# Patient Record
Sex: Female | Born: 1935 | Race: White | Hispanic: No | Smoking: Former smoker
Health system: Southern US, Community
[De-identification: ages and names within clinical notes are randomized; demographics above are authoritative.]

## PROBLEM LIST (undated history)

## (undated) DIAGNOSIS — I10 Essential (primary) hypertension: Secondary | ICD-10-CM

## (undated) DIAGNOSIS — Z8701 Personal history of pneumonia (recurrent): Secondary | ICD-10-CM

## (undated) DIAGNOSIS — D649 Anemia, unspecified: Secondary | ICD-10-CM

## (undated) DIAGNOSIS — G319 Degenerative disease of nervous system, unspecified: Secondary | ICD-10-CM

## (undated) DIAGNOSIS — R911 Solitary pulmonary nodule: Secondary | ICD-10-CM

## (undated) DIAGNOSIS — I5040 Unspecified combined systolic (congestive) and diastolic (congestive) heart failure: Secondary | ICD-10-CM

## (undated) DIAGNOSIS — G709 Myoneural disorder, unspecified: Secondary | ICD-10-CM

## (undated) DIAGNOSIS — F419 Anxiety disorder, unspecified: Secondary | ICD-10-CM

## (undated) DIAGNOSIS — M1990 Unspecified osteoarthritis, unspecified site: Secondary | ICD-10-CM

## (undated) DIAGNOSIS — E039 Hypothyroidism, unspecified: Secondary | ICD-10-CM

## (undated) DIAGNOSIS — M48061 Spinal stenosis, lumbar region without neurogenic claudication: Secondary | ICD-10-CM

## (undated) DIAGNOSIS — G20 Parkinson's disease: Secondary | ICD-10-CM

## (undated) DIAGNOSIS — Z9861 Coronary angioplasty status: Secondary | ICD-10-CM

## (undated) DIAGNOSIS — Q132 Other congenital malformations of iris: Secondary | ICD-10-CM

## (undated) DIAGNOSIS — E785 Hyperlipidemia, unspecified: Secondary | ICD-10-CM

## (undated) DIAGNOSIS — K219 Gastro-esophageal reflux disease without esophagitis: Secondary | ICD-10-CM

## (undated) DIAGNOSIS — Z9889 Other specified postprocedural states: Secondary | ICD-10-CM

## (undated) DIAGNOSIS — M2010 Hallux valgus (acquired), unspecified foot: Secondary | ICD-10-CM

## (undated) DIAGNOSIS — F329 Major depressive disorder, single episode, unspecified: Secondary | ICD-10-CM

## (undated) DIAGNOSIS — I2089 Other forms of angina pectoris: Secondary | ICD-10-CM

## (undated) DIAGNOSIS — I208 Other forms of angina pectoris: Secondary | ICD-10-CM

## (undated) DIAGNOSIS — J449 Chronic obstructive pulmonary disease, unspecified: Secondary | ICD-10-CM

## (undated) DIAGNOSIS — I4891 Unspecified atrial fibrillation: Secondary | ICD-10-CM

## (undated) DIAGNOSIS — F32A Depression, unspecified: Secondary | ICD-10-CM

## (undated) DIAGNOSIS — I214 Non-ST elevation (NSTEMI) myocardial infarction: Secondary | ICD-10-CM

## (undated) DIAGNOSIS — I2510 Atherosclerotic heart disease of native coronary artery without angina pectoris: Secondary | ICD-10-CM

## (undated) DIAGNOSIS — I255 Ischemic cardiomyopathy: Secondary | ICD-10-CM

## (undated) DIAGNOSIS — N189 Chronic kidney disease, unspecified: Secondary | ICD-10-CM

## (undated) DIAGNOSIS — I82409 Acute embolism and thrombosis of unspecified deep veins of unspecified lower extremity: Secondary | ICD-10-CM

## (undated) DIAGNOSIS — E0789 Other specified disorders of thyroid: Secondary | ICD-10-CM

## (undated) DIAGNOSIS — G20A1 Parkinson's disease without dyskinesia, without mention of fluctuations: Secondary | ICD-10-CM

## (undated) DIAGNOSIS — I639 Cerebral infarction, unspecified: Secondary | ICD-10-CM

## (undated) HISTORY — PX: ROTATOR CUFF REPAIR: SHX139

## (undated) HISTORY — PX: CHOLECYSTECTOMY: SHX55

## (undated) HISTORY — DX: Atherosclerotic heart disease of native coronary artery without angina pectoris: I25.10

## (undated) HISTORY — DX: Unspecified combined systolic (congestive) and diastolic (congestive) heart failure: I50.40

## (undated) HISTORY — DX: Spinal stenosis, lumbar region without neurogenic claudication: M48.061

## (undated) HISTORY — PX: CERVICAL DISCECTOMY: SHX98

## (undated) HISTORY — DX: Parkinson's disease without dyskinesia, without mention of fluctuations: G20.A1

## (undated) HISTORY — DX: Acute embolism and thrombosis of unspecified deep veins of unspecified lower extremity: I82.409

## (undated) HISTORY — PX: SPLENECTOMY: SUR1306

## (undated) HISTORY — PX: APPENDECTOMY: SHX54

## (undated) HISTORY — DX: Other specified postprocedural states: Z98.890

## (undated) HISTORY — DX: Other forms of angina pectoris: I20.8

## (undated) HISTORY — DX: Other congenital malformations of iris: Q13.2

## (undated) HISTORY — DX: Hallux valgus (acquired), unspecified foot: M20.10

## (undated) HISTORY — DX: Hypothyroidism, unspecified: E03.9

## (undated) HISTORY — DX: Other specified disorders of thyroid: E07.89

## (undated) HISTORY — DX: Personal history of pneumonia (recurrent): Z87.01

## (undated) HISTORY — DX: Parkinson's disease: G20

## (undated) HISTORY — DX: Other forms of angina pectoris: I20.89

## (undated) HISTORY — DX: Solitary pulmonary nodule: R91.1

## (undated) HISTORY — DX: Coronary angioplasty status: Z98.61

## (undated) HISTORY — DX: Degenerative disease of nervous system, unspecified: G31.9

## (undated) HISTORY — DX: Hyperlipidemia, unspecified: E78.5

## (undated) HISTORY — DX: Non-ST elevation (NSTEMI) myocardial infarction: I21.4

## (undated) HISTORY — DX: Ischemic cardiomyopathy: I25.5

---
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
## 1993-07-31 ENCOUNTER — Encounter (INDEPENDENT_AMBULATORY_CARE_PROVIDER_SITE_OTHER): Payer: Self-pay | Admitting: *Deleted

## 1993-07-31 LAB — CONVERTED CEMR LAB

## 1997-10-31 DIAGNOSIS — R911 Solitary pulmonary nodule: Secondary | ICD-10-CM

## 1997-10-31 HISTORY — DX: Solitary pulmonary nodule: R91.1

## 1997-10-31 HISTORY — PX: CARDIAC CATHETERIZATION: SHX172

## 1998-02-03 ENCOUNTER — Encounter: Admission: RE | Admit: 1998-02-03 | Discharge: 1998-02-03 | Payer: Self-pay | Admitting: Sports Medicine

## 1998-02-22 ENCOUNTER — Emergency Department (HOSPITAL_COMMUNITY): Admission: EM | Admit: 1998-02-22 | Discharge: 1998-02-22 | Payer: Self-pay | Admitting: Emergency Medicine

## 1998-02-24 ENCOUNTER — Encounter: Admission: RE | Admit: 1998-02-24 | Discharge: 1998-02-24 | Payer: Self-pay | Admitting: Family Medicine

## 1998-02-26 ENCOUNTER — Inpatient Hospital Stay (HOSPITAL_COMMUNITY): Admission: EM | Admit: 1998-02-26 | Discharge: 1998-03-04 | Payer: Self-pay | Admitting: Emergency Medicine

## 1998-03-03 ENCOUNTER — Encounter: Admission: RE | Admit: 1998-03-03 | Discharge: 1998-03-03 | Payer: Self-pay | Admitting: Family Medicine

## 1998-03-06 ENCOUNTER — Other Ambulatory Visit: Admission: RE | Admit: 1998-03-06 | Discharge: 1998-03-06 | Payer: Self-pay | Admitting: Family Medicine

## 1998-03-09 ENCOUNTER — Other Ambulatory Visit: Admission: RE | Admit: 1998-03-09 | Discharge: 1998-03-09 | Payer: Self-pay | Admitting: Family Medicine

## 1998-03-10 ENCOUNTER — Encounter: Admission: RE | Admit: 1998-03-10 | Discharge: 1998-03-10 | Payer: Self-pay | Admitting: Family Medicine

## 1998-03-11 ENCOUNTER — Other Ambulatory Visit: Admission: RE | Admit: 1998-03-11 | Discharge: 1998-03-11 | Payer: Self-pay | Admitting: Family Medicine

## 1998-03-13 ENCOUNTER — Other Ambulatory Visit: Admission: RE | Admit: 1998-03-13 | Discharge: 1998-03-13 | Payer: Self-pay | Admitting: Cardiology

## 1998-03-16 ENCOUNTER — Other Ambulatory Visit: Admission: RE | Admit: 1998-03-16 | Discharge: 1998-03-16 | Payer: Self-pay | Admitting: Family Medicine

## 1998-03-18 ENCOUNTER — Other Ambulatory Visit: Admission: RE | Admit: 1998-03-18 | Discharge: 1998-03-18 | Payer: Self-pay | Admitting: Family Medicine

## 1998-03-20 ENCOUNTER — Other Ambulatory Visit: Admission: RE | Admit: 1998-03-20 | Discharge: 1998-03-20 | Payer: Self-pay | Admitting: Family Medicine

## 1998-03-20 ENCOUNTER — Encounter: Admission: RE | Admit: 1998-03-20 | Discharge: 1998-03-20 | Payer: Self-pay | Admitting: Family Medicine

## 1998-03-24 ENCOUNTER — Other Ambulatory Visit: Admission: RE | Admit: 1998-03-24 | Discharge: 1998-03-24 | Payer: Self-pay | Admitting: Family Medicine

## 1998-03-25 ENCOUNTER — Other Ambulatory Visit: Admission: RE | Admit: 1998-03-25 | Discharge: 1998-03-25 | Payer: Self-pay | Admitting: Oncology

## 1998-03-31 ENCOUNTER — Other Ambulatory Visit: Admission: RE | Admit: 1998-03-31 | Discharge: 1998-03-31 | Payer: Self-pay | Admitting: Family Medicine

## 1998-04-07 ENCOUNTER — Other Ambulatory Visit: Admission: RE | Admit: 1998-04-07 | Discharge: 1998-04-07 | Payer: Self-pay | Admitting: Family Medicine

## 1998-04-15 ENCOUNTER — Encounter: Admission: RE | Admit: 1998-04-15 | Discharge: 1998-04-15 | Payer: Self-pay | Admitting: Family Medicine

## 1998-05-06 ENCOUNTER — Encounter: Admission: RE | Admit: 1998-05-06 | Discharge: 1998-05-06 | Payer: Self-pay | Admitting: Family Medicine

## 1998-05-11 ENCOUNTER — Ambulatory Visit (HOSPITAL_COMMUNITY): Admission: RE | Admit: 1998-05-11 | Discharge: 1998-05-11 | Payer: Self-pay | Admitting: Cardiology

## 1998-05-15 ENCOUNTER — Ambulatory Visit (HOSPITAL_COMMUNITY): Admission: RE | Admit: 1998-05-15 | Discharge: 1998-05-15 | Payer: Self-pay | Admitting: Cardiology

## 1998-05-18 ENCOUNTER — Inpatient Hospital Stay (HOSPITAL_COMMUNITY): Admission: EM | Admit: 1998-05-18 | Discharge: 1998-05-20 | Payer: Self-pay | Admitting: Cardiology

## 1998-05-19 ENCOUNTER — Encounter: Admission: RE | Admit: 1998-05-19 | Discharge: 1998-05-19 | Payer: Self-pay | Admitting: Family Medicine

## 1998-06-03 ENCOUNTER — Encounter: Admission: RE | Admit: 1998-06-03 | Discharge: 1998-06-03 | Payer: Self-pay | Admitting: Family Medicine

## 1998-06-11 ENCOUNTER — Encounter: Admission: RE | Admit: 1998-06-11 | Discharge: 1998-06-11 | Payer: Self-pay | Admitting: Family Medicine

## 1998-06-24 ENCOUNTER — Encounter: Admission: RE | Admit: 1998-06-24 | Discharge: 1998-06-24 | Payer: Self-pay | Admitting: Family Medicine

## 1998-07-21 ENCOUNTER — Encounter: Admission: RE | Admit: 1998-07-21 | Discharge: 1998-07-21 | Payer: Self-pay | Admitting: Family Medicine

## 1998-08-13 ENCOUNTER — Other Ambulatory Visit: Admission: RE | Admit: 1998-08-13 | Discharge: 1998-08-13 | Payer: Self-pay | Admitting: Gynecology

## 1998-09-22 ENCOUNTER — Encounter: Admission: RE | Admit: 1998-09-22 | Discharge: 1998-09-22 | Payer: Self-pay | Admitting: Sports Medicine

## 1998-10-20 ENCOUNTER — Encounter: Payer: Self-pay | Admitting: Family Medicine

## 1998-10-20 ENCOUNTER — Encounter: Admission: RE | Admit: 1998-10-20 | Discharge: 1998-10-20 | Payer: Self-pay | Admitting: Sports Medicine

## 1998-10-20 ENCOUNTER — Inpatient Hospital Stay (HOSPITAL_COMMUNITY): Admission: AD | Admit: 1998-10-20 | Discharge: 1998-10-22 | Payer: Self-pay | Admitting: Family Medicine

## 1998-10-31 HISTORY — PX: CARDIAC CATHETERIZATION: SHX172

## 1998-11-03 ENCOUNTER — Encounter: Admission: RE | Admit: 1998-11-03 | Discharge: 1998-11-03 | Payer: Self-pay | Admitting: Family Medicine

## 1998-11-05 ENCOUNTER — Inpatient Hospital Stay (HOSPITAL_COMMUNITY): Admission: AD | Admit: 1998-11-05 | Discharge: 1998-11-06 | Payer: Self-pay | Admitting: Cardiology

## 1998-12-01 ENCOUNTER — Encounter: Admission: RE | Admit: 1998-12-01 | Discharge: 1998-12-01 | Payer: Self-pay | Admitting: Family Medicine

## 1998-12-30 ENCOUNTER — Encounter: Admission: RE | Admit: 1998-12-30 | Discharge: 1998-12-30 | Payer: Self-pay | Admitting: Family Medicine

## 1999-01-12 ENCOUNTER — Ambulatory Visit (HOSPITAL_COMMUNITY): Admission: RE | Admit: 1999-01-12 | Discharge: 1999-01-12 | Payer: Self-pay | Admitting: Gastroenterology

## 1999-01-17 ENCOUNTER — Inpatient Hospital Stay (HOSPITAL_COMMUNITY): Admission: EM | Admit: 1999-01-17 | Discharge: 1999-01-19 | Payer: Self-pay | Admitting: Emergency Medicine

## 1999-01-17 ENCOUNTER — Encounter: Payer: Self-pay | Admitting: Emergency Medicine

## 1999-01-19 ENCOUNTER — Encounter: Payer: Self-pay | Admitting: Emergency Medicine

## 1999-01-19 ENCOUNTER — Encounter: Admission: RE | Admit: 1999-01-19 | Discharge: 1999-01-19 | Payer: Self-pay | Admitting: Sports Medicine

## 1999-01-26 ENCOUNTER — Encounter: Admission: RE | Admit: 1999-01-26 | Discharge: 1999-01-26 | Payer: Self-pay | Admitting: Family Medicine

## 1999-02-04 ENCOUNTER — Ambulatory Visit (HOSPITAL_COMMUNITY): Admission: RE | Admit: 1999-02-04 | Discharge: 1999-02-04 | Payer: Self-pay | Admitting: Gynecology

## 1999-02-09 ENCOUNTER — Encounter: Admission: RE | Admit: 1999-02-09 | Discharge: 1999-02-09 | Payer: Self-pay | Admitting: Family Medicine

## 1999-03-09 ENCOUNTER — Encounter: Admission: RE | Admit: 1999-03-09 | Discharge: 1999-03-09 | Payer: Self-pay | Admitting: Family Medicine

## 1999-03-24 ENCOUNTER — Encounter: Admission: RE | Admit: 1999-03-24 | Discharge: 1999-03-24 | Payer: Self-pay | Admitting: Family Medicine

## 1999-03-29 ENCOUNTER — Inpatient Hospital Stay (HOSPITAL_COMMUNITY): Admission: EM | Admit: 1999-03-29 | Discharge: 1999-03-31 | Payer: Self-pay | Admitting: Emergency Medicine

## 1999-03-29 ENCOUNTER — Encounter: Payer: Self-pay | Admitting: Emergency Medicine

## 1999-04-06 ENCOUNTER — Encounter: Admission: RE | Admit: 1999-04-06 | Discharge: 1999-04-06 | Payer: Self-pay | Admitting: Family Medicine

## 1999-04-26 ENCOUNTER — Encounter: Payer: Self-pay | Admitting: Cardiology

## 1999-04-26 ENCOUNTER — Ambulatory Visit (HOSPITAL_COMMUNITY): Admission: RE | Admit: 1999-04-26 | Discharge: 1999-04-26 | Payer: Self-pay | Admitting: Cardiology

## 1999-05-03 ENCOUNTER — Encounter: Admission: RE | Admit: 1999-05-03 | Discharge: 1999-05-03 | Payer: Self-pay | Admitting: Family Medicine

## 1999-05-05 ENCOUNTER — Ambulatory Visit (HOSPITAL_COMMUNITY): Admission: RE | Admit: 1999-05-05 | Discharge: 1999-05-05 | Payer: Self-pay | Admitting: Family Medicine

## 1999-05-11 ENCOUNTER — Encounter: Admission: RE | Admit: 1999-05-11 | Discharge: 1999-05-11 | Payer: Self-pay | Admitting: Sports Medicine

## 1999-05-14 ENCOUNTER — Encounter: Admission: RE | Admit: 1999-05-14 | Discharge: 1999-05-14 | Payer: Self-pay | Admitting: Family Medicine

## 1999-05-21 ENCOUNTER — Encounter: Admission: RE | Admit: 1999-05-21 | Discharge: 1999-05-21 | Payer: Self-pay | Admitting: Family Medicine

## 1999-05-25 ENCOUNTER — Encounter: Admission: RE | Admit: 1999-05-25 | Discharge: 1999-05-25 | Payer: Self-pay | Admitting: Family Medicine

## 1999-06-08 ENCOUNTER — Encounter: Admission: RE | Admit: 1999-06-08 | Discharge: 1999-06-08 | Payer: Self-pay | Admitting: Family Medicine

## 1999-06-14 ENCOUNTER — Ambulatory Visit (HOSPITAL_COMMUNITY): Admission: RE | Admit: 1999-06-14 | Discharge: 1999-06-14 | Payer: Self-pay | Admitting: Cardiology

## 1999-07-06 ENCOUNTER — Encounter: Admission: RE | Admit: 1999-07-06 | Discharge: 1999-07-06 | Payer: Self-pay | Admitting: Sports Medicine

## 1999-07-29 ENCOUNTER — Ambulatory Visit (HOSPITAL_COMMUNITY): Admission: RE | Admit: 1999-07-29 | Discharge: 1999-07-30 | Payer: Self-pay | Admitting: Orthopedic Surgery

## 1999-08-03 ENCOUNTER — Encounter: Admission: RE | Admit: 1999-08-03 | Discharge: 1999-08-03 | Payer: Self-pay | Admitting: Family Medicine

## 1999-08-05 ENCOUNTER — Encounter: Admission: RE | Admit: 1999-08-05 | Discharge: 1999-11-03 | Payer: Self-pay | Admitting: Orthopedic Surgery

## 1999-08-17 ENCOUNTER — Encounter: Admission: RE | Admit: 1999-08-17 | Discharge: 1999-08-17 | Payer: Self-pay | Admitting: Family Medicine

## 1999-09-07 ENCOUNTER — Encounter: Admission: RE | Admit: 1999-09-07 | Discharge: 1999-09-07 | Payer: Self-pay | Admitting: Family Medicine

## 1999-09-08 ENCOUNTER — Encounter: Admission: RE | Admit: 1999-09-08 | Discharge: 1999-09-08 | Payer: Self-pay | Admitting: Family Medicine

## 1999-09-21 ENCOUNTER — Encounter: Admission: RE | Admit: 1999-09-21 | Discharge: 1999-09-21 | Payer: Self-pay | Admitting: Family Medicine

## 1999-10-05 ENCOUNTER — Encounter: Admission: RE | Admit: 1999-10-05 | Discharge: 1999-10-05 | Payer: Self-pay | Admitting: Family Medicine

## 1999-10-09 ENCOUNTER — Inpatient Hospital Stay (HOSPITAL_COMMUNITY): Admission: EM | Admit: 1999-10-09 | Discharge: 1999-10-12 | Payer: Self-pay | Admitting: Emergency Medicine

## 1999-10-09 ENCOUNTER — Encounter: Payer: Self-pay | Admitting: Family Medicine

## 1999-10-13 ENCOUNTER — Encounter: Admission: RE | Admit: 1999-10-13 | Discharge: 1999-10-13 | Payer: Self-pay | Admitting: Family Medicine

## 1999-10-15 ENCOUNTER — Emergency Department (HOSPITAL_COMMUNITY): Admission: EM | Admit: 1999-10-15 | Discharge: 1999-10-15 | Payer: Self-pay | Admitting: *Deleted

## 1999-10-15 ENCOUNTER — Encounter: Payer: Self-pay | Admitting: Emergency Medicine

## 1999-10-19 ENCOUNTER — Encounter: Admission: RE | Admit: 1999-10-19 | Discharge: 1999-10-19 | Payer: Self-pay | Admitting: Family Medicine

## 1999-10-29 ENCOUNTER — Encounter: Admission: RE | Admit: 1999-10-29 | Discharge: 1999-10-29 | Payer: Self-pay | Admitting: Family Medicine

## 1999-11-01 DIAGNOSIS — E0789 Other specified disorders of thyroid: Secondary | ICD-10-CM

## 1999-11-01 DIAGNOSIS — M48061 Spinal stenosis, lumbar region without neurogenic claudication: Secondary | ICD-10-CM

## 1999-11-01 HISTORY — DX: Spinal stenosis, lumbar region without neurogenic claudication: M48.061

## 1999-11-01 HISTORY — PX: ORIF HIP FRACTURE: SHX2125

## 1999-11-01 HISTORY — DX: Other specified disorders of thyroid: E07.89

## 1999-11-02 ENCOUNTER — Encounter: Payer: Self-pay | Admitting: Family Medicine

## 1999-11-02 ENCOUNTER — Encounter: Admission: RE | Admit: 1999-11-02 | Discharge: 1999-11-02 | Payer: Self-pay | Admitting: Family Medicine

## 1999-11-02 ENCOUNTER — Inpatient Hospital Stay (HOSPITAL_COMMUNITY): Admission: RE | Admit: 1999-11-02 | Discharge: 1999-11-05 | Payer: Self-pay | Admitting: Family Medicine

## 1999-11-10 ENCOUNTER — Encounter: Admission: RE | Admit: 1999-11-10 | Discharge: 1999-11-10 | Payer: Self-pay | Admitting: Family Medicine

## 1999-11-23 ENCOUNTER — Encounter: Payer: Self-pay | Admitting: Family Medicine

## 1999-11-23 ENCOUNTER — Ambulatory Visit (HOSPITAL_COMMUNITY): Admission: RE | Admit: 1999-11-23 | Discharge: 1999-11-23 | Payer: Self-pay | Admitting: Family Medicine

## 1999-11-24 ENCOUNTER — Encounter: Admission: RE | Admit: 1999-11-24 | Discharge: 1999-11-24 | Payer: Self-pay | Admitting: Family Medicine

## 1999-12-08 ENCOUNTER — Encounter: Admission: RE | Admit: 1999-12-08 | Discharge: 1999-12-08 | Payer: Self-pay | Admitting: Family Medicine

## 1999-12-14 ENCOUNTER — Encounter: Admission: RE | Admit: 1999-12-14 | Discharge: 1999-12-14 | Payer: Self-pay | Admitting: Family Medicine

## 1999-12-28 ENCOUNTER — Encounter: Admission: RE | Admit: 1999-12-28 | Discharge: 1999-12-28 | Payer: Self-pay | Admitting: Family Medicine

## 2000-01-03 ENCOUNTER — Encounter: Payer: Self-pay | Admitting: Otolaryngology

## 2000-01-03 ENCOUNTER — Ambulatory Visit (HOSPITAL_COMMUNITY): Admission: RE | Admit: 2000-01-03 | Discharge: 2000-01-03 | Payer: Self-pay | Admitting: Otolaryngology

## 2000-01-18 ENCOUNTER — Encounter: Admission: RE | Admit: 2000-01-18 | Discharge: 2000-01-18 | Payer: Self-pay | Admitting: Family Medicine

## 2000-02-08 ENCOUNTER — Encounter: Admission: RE | Admit: 2000-02-08 | Discharge: 2000-02-08 | Payer: Self-pay | Admitting: Family Medicine

## 2000-02-22 ENCOUNTER — Encounter: Admission: RE | Admit: 2000-02-22 | Discharge: 2000-02-22 | Payer: Self-pay | Admitting: Family Medicine

## 2000-02-28 ENCOUNTER — Encounter: Admission: RE | Admit: 2000-02-28 | Discharge: 2000-02-28 | Payer: Self-pay | Admitting: Family Medicine

## 2000-02-28 ENCOUNTER — Encounter: Admission: RE | Admit: 2000-02-28 | Discharge: 2000-02-28 | Payer: Self-pay | Admitting: Sports Medicine

## 2000-02-28 ENCOUNTER — Encounter: Payer: Self-pay | Admitting: Sports Medicine

## 2000-03-07 ENCOUNTER — Encounter: Payer: Self-pay | Admitting: Emergency Medicine

## 2000-03-07 ENCOUNTER — Inpatient Hospital Stay (HOSPITAL_COMMUNITY): Admission: EM | Admit: 2000-03-07 | Discharge: 2000-03-15 | Payer: Self-pay | Admitting: Emergency Medicine

## 2000-03-08 ENCOUNTER — Encounter: Payer: Self-pay | Admitting: Orthopedic Surgery

## 2000-03-10 ENCOUNTER — Encounter: Payer: Self-pay | Admitting: Orthopedic Surgery

## 2000-03-14 ENCOUNTER — Encounter: Payer: Self-pay | Admitting: Orthopedic Surgery

## 2000-03-15 ENCOUNTER — Encounter: Admission: RE | Admit: 2000-03-15 | Discharge: 2000-06-13 | Payer: Self-pay | Admitting: Family Medicine

## 2000-04-01 ENCOUNTER — Encounter: Payer: Self-pay | Admitting: Emergency Medicine

## 2000-04-01 ENCOUNTER — Inpatient Hospital Stay (HOSPITAL_COMMUNITY): Admission: EM | Admit: 2000-04-01 | Discharge: 2000-04-03 | Payer: Self-pay | Admitting: Emergency Medicine

## 2000-05-23 ENCOUNTER — Ambulatory Visit (HOSPITAL_COMMUNITY): Admission: RE | Admit: 2000-05-23 | Discharge: 2000-05-23 | Payer: Self-pay | Admitting: Family Medicine

## 2000-06-13 ENCOUNTER — Encounter: Admission: RE | Admit: 2000-06-13 | Discharge: 2000-06-13 | Payer: Self-pay | Admitting: Family Medicine

## 2000-07-18 ENCOUNTER — Encounter: Admission: RE | Admit: 2000-07-18 | Discharge: 2000-07-18 | Payer: Self-pay | Admitting: Family Medicine

## 2000-07-19 ENCOUNTER — Other Ambulatory Visit: Admission: RE | Admit: 2000-07-19 | Discharge: 2000-07-19 | Payer: Self-pay | Admitting: Gynecology

## 2000-08-07 ENCOUNTER — Encounter: Admission: RE | Admit: 2000-08-07 | Discharge: 2000-08-07 | Payer: Self-pay | Admitting: Family Medicine

## 2000-08-07 ENCOUNTER — Encounter: Payer: Self-pay | Admitting: Family Medicine

## 2000-08-28 ENCOUNTER — Encounter: Admission: RE | Admit: 2000-08-28 | Discharge: 2000-08-28 | Payer: Self-pay | Admitting: Family Medicine

## 2000-09-13 ENCOUNTER — Encounter: Admission: RE | Admit: 2000-09-13 | Discharge: 2000-09-13 | Payer: Self-pay | Admitting: Family Medicine

## 2000-10-11 ENCOUNTER — Encounter: Admission: RE | Admit: 2000-10-11 | Discharge: 2000-10-11 | Payer: Self-pay | Admitting: Family Medicine

## 2000-11-08 ENCOUNTER — Encounter: Admission: RE | Admit: 2000-11-08 | Discharge: 2000-11-08 | Payer: Self-pay | Admitting: Family Medicine

## 2000-12-06 ENCOUNTER — Encounter: Admission: RE | Admit: 2000-12-06 | Discharge: 2000-12-06 | Payer: Self-pay | Admitting: Family Medicine

## 2000-12-29 HISTORY — PX: CARDIAC CATHETERIZATION: SHX172

## 2001-01-01 ENCOUNTER — Encounter: Admission: RE | Admit: 2001-01-01 | Discharge: 2001-01-01 | Payer: Self-pay | Admitting: Family Medicine

## 2001-01-09 ENCOUNTER — Encounter: Payer: Self-pay | Admitting: Emergency Medicine

## 2001-01-09 ENCOUNTER — Inpatient Hospital Stay (HOSPITAL_COMMUNITY): Admission: EM | Admit: 2001-01-09 | Discharge: 2001-01-12 | Payer: Self-pay | Admitting: Emergency Medicine

## 2001-01-10 ENCOUNTER — Encounter: Payer: Self-pay | Admitting: Family Medicine

## 2001-01-17 ENCOUNTER — Encounter: Admission: RE | Admit: 2001-01-17 | Discharge: 2001-01-17 | Payer: Self-pay | Admitting: Family Medicine

## 2001-02-13 ENCOUNTER — Encounter: Admission: RE | Admit: 2001-02-13 | Discharge: 2001-02-13 | Payer: Self-pay | Admitting: Family Medicine

## 2001-02-14 ENCOUNTER — Encounter: Admission: RE | Admit: 2001-02-14 | Discharge: 2001-02-14 | Payer: Self-pay | Admitting: Family Medicine

## 2001-03-13 ENCOUNTER — Encounter: Admission: RE | Admit: 2001-03-13 | Discharge: 2001-03-13 | Payer: Self-pay | Admitting: Family Medicine

## 2001-03-27 ENCOUNTER — Encounter: Admission: RE | Admit: 2001-03-27 | Discharge: 2001-03-27 | Payer: Self-pay | Admitting: Family Medicine

## 2001-04-24 ENCOUNTER — Encounter: Admission: RE | Admit: 2001-04-24 | Discharge: 2001-04-24 | Payer: Self-pay | Admitting: Family Medicine

## 2001-05-22 ENCOUNTER — Encounter: Admission: RE | Admit: 2001-05-22 | Discharge: 2001-05-22 | Payer: Self-pay | Admitting: Family Medicine

## 2001-05-30 ENCOUNTER — Encounter: Admission: RE | Admit: 2001-05-30 | Discharge: 2001-05-30 | Payer: Self-pay | Admitting: Family Medicine

## 2001-05-31 ENCOUNTER — Encounter: Admission: RE | Admit: 2001-05-31 | Discharge: 2001-05-31 | Payer: Self-pay | Admitting: Family Medicine

## 2001-06-06 ENCOUNTER — Encounter: Admission: RE | Admit: 2001-06-06 | Discharge: 2001-06-06 | Payer: Self-pay | Admitting: Family Medicine

## 2001-06-19 ENCOUNTER — Encounter: Admission: RE | Admit: 2001-06-19 | Discharge: 2001-06-19 | Payer: Self-pay | Admitting: Family Medicine

## 2001-06-21 ENCOUNTER — Encounter: Payer: Self-pay | Admitting: Family Medicine

## 2001-06-21 ENCOUNTER — Encounter: Admission: RE | Admit: 2001-06-21 | Discharge: 2001-06-21 | Payer: Self-pay | Admitting: Family Medicine

## 2001-07-17 ENCOUNTER — Encounter: Admission: RE | Admit: 2001-07-17 | Discharge: 2001-07-17 | Payer: Self-pay | Admitting: Family Medicine

## 2001-07-31 HISTORY — PX: CARDIAC CATHETERIZATION: SHX172

## 2001-08-14 ENCOUNTER — Encounter: Admission: RE | Admit: 2001-08-14 | Discharge: 2001-08-14 | Payer: Self-pay | Admitting: Family Medicine

## 2001-08-23 ENCOUNTER — Inpatient Hospital Stay (HOSPITAL_COMMUNITY): Admission: EM | Admit: 2001-08-23 | Discharge: 2001-08-25 | Payer: Self-pay

## 2001-08-23 ENCOUNTER — Encounter: Payer: Self-pay | Admitting: Cardiology

## 2001-10-02 ENCOUNTER — Encounter: Admission: RE | Admit: 2001-10-02 | Discharge: 2001-10-02 | Payer: Self-pay | Admitting: Family Medicine

## 2001-10-18 ENCOUNTER — Encounter: Admission: RE | Admit: 2001-10-18 | Discharge: 2001-10-18 | Payer: Self-pay | Admitting: Family Medicine

## 2001-10-18 ENCOUNTER — Ambulatory Visit (HOSPITAL_COMMUNITY): Admission: RE | Admit: 2001-10-18 | Discharge: 2001-10-18 | Payer: Self-pay | Admitting: Family Medicine

## 2001-10-18 ENCOUNTER — Encounter: Payer: Self-pay | Admitting: *Deleted

## 2001-10-18 ENCOUNTER — Encounter: Admission: RE | Admit: 2001-10-18 | Discharge: 2001-10-18 | Payer: Self-pay | Admitting: *Deleted

## 2001-10-19 ENCOUNTER — Encounter: Admission: RE | Admit: 2001-10-19 | Discharge: 2001-10-19 | Payer: Self-pay | Admitting: Family Medicine

## 2001-11-06 ENCOUNTER — Encounter: Admission: RE | Admit: 2001-11-06 | Discharge: 2001-11-06 | Payer: Self-pay | Admitting: Family Medicine

## 2001-12-04 ENCOUNTER — Encounter (INDEPENDENT_AMBULATORY_CARE_PROVIDER_SITE_OTHER): Payer: Self-pay | Admitting: Specialist

## 2001-12-04 ENCOUNTER — Ambulatory Visit (HOSPITAL_COMMUNITY): Admission: RE | Admit: 2001-12-04 | Discharge: 2001-12-04 | Payer: Self-pay | Admitting: Gastroenterology

## 2001-12-12 ENCOUNTER — Encounter: Admission: RE | Admit: 2001-12-12 | Discharge: 2001-12-12 | Payer: Self-pay | Admitting: Family Medicine

## 2001-12-17 ENCOUNTER — Emergency Department (HOSPITAL_COMMUNITY): Admission: EM | Admit: 2001-12-17 | Discharge: 2001-12-17 | Payer: Self-pay

## 2001-12-17 ENCOUNTER — Encounter: Payer: Self-pay | Admitting: Emergency Medicine

## 2002-01-05 ENCOUNTER — Emergency Department (HOSPITAL_COMMUNITY): Admission: EM | Admit: 2002-01-05 | Discharge: 2002-01-05 | Payer: Self-pay | Admitting: Emergency Medicine

## 2002-01-05 ENCOUNTER — Encounter: Payer: Self-pay | Admitting: Emergency Medicine

## 2002-01-08 ENCOUNTER — Encounter: Admission: RE | Admit: 2002-01-08 | Discharge: 2002-01-08 | Payer: Self-pay | Admitting: Family Medicine

## 2002-02-05 ENCOUNTER — Encounter: Admission: RE | Admit: 2002-02-05 | Discharge: 2002-02-05 | Payer: Self-pay | Admitting: Family Medicine

## 2002-03-05 ENCOUNTER — Encounter: Admission: RE | Admit: 2002-03-05 | Discharge: 2002-03-05 | Payer: Self-pay | Admitting: Family Medicine

## 2002-03-12 ENCOUNTER — Encounter: Payer: Self-pay | Admitting: Family Medicine

## 2002-03-12 ENCOUNTER — Encounter: Admission: RE | Admit: 2002-03-12 | Discharge: 2002-03-12 | Payer: Self-pay | Admitting: Family Medicine

## 2002-04-02 ENCOUNTER — Encounter: Admission: RE | Admit: 2002-04-02 | Discharge: 2002-04-02 | Payer: Self-pay | Admitting: Family Medicine

## 2002-05-01 ENCOUNTER — Encounter: Admission: RE | Admit: 2002-05-01 | Discharge: 2002-05-01 | Payer: Self-pay | Admitting: Family Medicine

## 2002-05-21 ENCOUNTER — Encounter: Admission: RE | Admit: 2002-05-21 | Discharge: 2002-05-21 | Payer: Self-pay | Admitting: Family Medicine

## 2002-06-14 ENCOUNTER — Encounter: Admission: RE | Admit: 2002-06-14 | Discharge: 2002-06-14 | Payer: Self-pay | Admitting: Family Medicine

## 2002-06-17 ENCOUNTER — Encounter: Admission: RE | Admit: 2002-06-17 | Discharge: 2002-06-17 | Payer: Self-pay | Admitting: Family Medicine

## 2002-06-25 ENCOUNTER — Encounter: Admission: RE | Admit: 2002-06-25 | Discharge: 2002-06-25 | Payer: Self-pay | Admitting: Family Medicine

## 2002-07-01 DIAGNOSIS — G319 Degenerative disease of nervous system, unspecified: Secondary | ICD-10-CM

## 2002-07-01 HISTORY — DX: Degenerative disease of nervous system, unspecified: G31.9

## 2002-07-09 ENCOUNTER — Encounter: Admission: RE | Admit: 2002-07-09 | Discharge: 2002-07-09 | Payer: Self-pay | Admitting: Family Medicine

## 2002-07-12 ENCOUNTER — Encounter: Admission: RE | Admit: 2002-07-12 | Discharge: 2002-07-12 | Payer: Self-pay | Admitting: Family Medicine

## 2002-07-12 ENCOUNTER — Encounter: Payer: Self-pay | Admitting: Family Medicine

## 2002-07-23 ENCOUNTER — Encounter: Admission: RE | Admit: 2002-07-23 | Discharge: 2002-07-23 | Payer: Self-pay | Admitting: Family Medicine

## 2002-08-06 ENCOUNTER — Encounter: Admission: RE | Admit: 2002-08-06 | Discharge: 2002-08-06 | Payer: Self-pay | Admitting: Orthopedic Surgery

## 2002-08-06 ENCOUNTER — Encounter: Payer: Self-pay | Admitting: Orthopedic Surgery

## 2002-08-20 ENCOUNTER — Encounter: Admission: RE | Admit: 2002-08-20 | Discharge: 2002-08-20 | Payer: Self-pay | Admitting: Family Medicine

## 2002-09-17 ENCOUNTER — Encounter: Admission: RE | Admit: 2002-09-17 | Discharge: 2002-09-17 | Payer: Self-pay | Admitting: Family Medicine

## 2002-10-15 ENCOUNTER — Encounter: Payer: Self-pay | Admitting: Emergency Medicine

## 2002-10-15 ENCOUNTER — Encounter: Admission: RE | Admit: 2002-10-15 | Discharge: 2002-10-15 | Payer: Self-pay | Admitting: Family Medicine

## 2002-10-16 ENCOUNTER — Inpatient Hospital Stay (HOSPITAL_COMMUNITY): Admission: EM | Admit: 2002-10-16 | Discharge: 2002-10-18 | Payer: Self-pay | Admitting: Emergency Medicine

## 2002-10-16 ENCOUNTER — Encounter: Payer: Self-pay | Admitting: Cardiology

## 2002-11-06 ENCOUNTER — Encounter: Admission: RE | Admit: 2002-11-06 | Discharge: 2002-11-06 | Payer: Self-pay | Admitting: Family Medicine

## 2002-11-07 ENCOUNTER — Encounter: Payer: Self-pay | Admitting: Family Medicine

## 2002-11-07 ENCOUNTER — Encounter: Admission: RE | Admit: 2002-11-07 | Discharge: 2002-11-07 | Payer: Self-pay | Admitting: Family Medicine

## 2002-12-01 DIAGNOSIS — I255 Ischemic cardiomyopathy: Secondary | ICD-10-CM

## 2002-12-01 HISTORY — DX: Ischemic cardiomyopathy: I25.5

## 2002-12-02 ENCOUNTER — Encounter: Payer: Self-pay | Admitting: Emergency Medicine

## 2002-12-02 ENCOUNTER — Emergency Department (HOSPITAL_COMMUNITY): Admission: EM | Admit: 2002-12-02 | Discharge: 2002-12-02 | Payer: Self-pay | Admitting: Emergency Medicine

## 2002-12-03 ENCOUNTER — Encounter: Admission: RE | Admit: 2002-12-03 | Discharge: 2002-12-03 | Payer: Self-pay | Admitting: Family Medicine

## 2002-12-04 ENCOUNTER — Encounter: Admission: RE | Admit: 2002-12-04 | Discharge: 2002-12-04 | Payer: Self-pay | Admitting: Sports Medicine

## 2002-12-04 ENCOUNTER — Encounter: Payer: Self-pay | Admitting: Sports Medicine

## 2002-12-14 ENCOUNTER — Emergency Department (HOSPITAL_COMMUNITY): Admission: EM | Admit: 2002-12-14 | Discharge: 2002-12-14 | Payer: Self-pay

## 2002-12-30 ENCOUNTER — Inpatient Hospital Stay (HOSPITAL_COMMUNITY): Admission: EM | Admit: 2002-12-30 | Discharge: 2003-01-02 | Payer: Self-pay | Admitting: Unknown Physician Specialty

## 2002-12-30 ENCOUNTER — Encounter: Payer: Self-pay | Admitting: Family Medicine

## 2003-01-07 ENCOUNTER — Encounter: Admission: RE | Admit: 2003-01-07 | Discharge: 2003-01-07 | Payer: Self-pay | Admitting: Family Medicine

## 2004-05-26 ENCOUNTER — Encounter: Admission: RE | Admit: 2004-05-26 | Discharge: 2004-05-26 | Payer: Self-pay | Admitting: Family Medicine

## 2004-06-16 ENCOUNTER — Encounter: Admission: RE | Admit: 2004-06-16 | Discharge: 2004-06-16 | Payer: Self-pay | Admitting: Sports Medicine

## 2004-06-30 ENCOUNTER — Encounter: Admission: RE | Admit: 2004-06-30 | Discharge: 2004-06-30 | Payer: Self-pay | Admitting: Family Medicine

## 2004-07-28 ENCOUNTER — Ambulatory Visit: Payer: Self-pay | Admitting: Family Medicine

## 2004-08-24 ENCOUNTER — Ambulatory Visit: Payer: Self-pay | Admitting: Family Medicine

## 2004-08-26 ENCOUNTER — Encounter: Admission: RE | Admit: 2004-08-26 | Discharge: 2004-08-26 | Payer: Self-pay | Admitting: Family Medicine

## 2004-09-08 ENCOUNTER — Ambulatory Visit: Payer: Self-pay | Admitting: Family Medicine

## 2004-09-28 ENCOUNTER — Ambulatory Visit: Payer: Self-pay | Admitting: Family Medicine

## 2004-10-19 ENCOUNTER — Ambulatory Visit: Payer: Self-pay | Admitting: Family Medicine

## 2004-11-16 ENCOUNTER — Ambulatory Visit: Payer: Self-pay | Admitting: Family Medicine

## 2004-11-30 ENCOUNTER — Ambulatory Visit: Payer: Self-pay | Admitting: Family Medicine

## 2005-01-04 ENCOUNTER — Ambulatory Visit: Payer: Self-pay | Admitting: Family Medicine

## 2005-02-15 ENCOUNTER — Ambulatory Visit: Payer: Self-pay | Admitting: Family Medicine

## 2005-02-28 HISTORY — PX: CARDIAC CATHETERIZATION: SHX172

## 2005-03-04 ENCOUNTER — Encounter: Admission: RE | Admit: 2005-03-04 | Discharge: 2005-03-04 | Payer: Self-pay | Admitting: Sports Medicine

## 2005-03-15 ENCOUNTER — Ambulatory Visit: Payer: Self-pay | Admitting: Family Medicine

## 2005-03-16 ENCOUNTER — Ambulatory Visit (HOSPITAL_COMMUNITY): Admission: RE | Admit: 2005-03-16 | Discharge: 2005-03-17 | Payer: Self-pay | Admitting: Cardiology

## 2005-03-22 ENCOUNTER — Ambulatory Visit: Payer: Self-pay | Admitting: Family Medicine

## 2005-03-25 ENCOUNTER — Inpatient Hospital Stay (HOSPITAL_COMMUNITY): Admission: EM | Admit: 2005-03-25 | Discharge: 2005-03-26 | Payer: Self-pay | Admitting: Emergency Medicine

## 2005-03-25 ENCOUNTER — Ambulatory Visit: Payer: Self-pay | Admitting: Family Medicine

## 2005-04-12 ENCOUNTER — Ambulatory Visit: Payer: Self-pay | Admitting: Family Medicine

## 2005-05-10 ENCOUNTER — Ambulatory Visit: Payer: Self-pay | Admitting: Family Medicine

## 2005-05-24 ENCOUNTER — Ambulatory Visit: Payer: Self-pay | Admitting: Family Medicine

## 2005-06-28 ENCOUNTER — Ambulatory Visit: Payer: Self-pay | Admitting: Family Medicine

## 2005-06-28 ENCOUNTER — Encounter: Admission: RE | Admit: 2005-06-28 | Discharge: 2005-06-28 | Payer: Self-pay | Admitting: Family Medicine

## 2005-07-26 ENCOUNTER — Encounter: Admission: RE | Admit: 2005-07-26 | Discharge: 2005-07-26 | Payer: Self-pay | Admitting: Family Medicine

## 2005-07-26 ENCOUNTER — Ambulatory Visit: Payer: Self-pay | Admitting: Family Medicine

## 2005-08-30 ENCOUNTER — Ambulatory Visit: Payer: Self-pay | Admitting: Family Medicine

## 2005-09-01 ENCOUNTER — Inpatient Hospital Stay (HOSPITAL_COMMUNITY): Admission: EM | Admit: 2005-09-01 | Discharge: 2005-09-03 | Payer: Self-pay | Admitting: *Deleted

## 2005-09-01 ENCOUNTER — Ambulatory Visit: Payer: Self-pay | Admitting: Family Medicine

## 2005-09-12 ENCOUNTER — Ambulatory Visit: Payer: Self-pay | Admitting: Family Medicine

## 2005-10-11 ENCOUNTER — Ambulatory Visit: Payer: Self-pay | Admitting: Family Medicine

## 2005-10-31 DIAGNOSIS — I2510 Atherosclerotic heart disease of native coronary artery without angina pectoris: Secondary | ICD-10-CM

## 2005-10-31 HISTORY — DX: Atherosclerotic heart disease of native coronary artery without angina pectoris: I25.10

## 2005-11-08 ENCOUNTER — Ambulatory Visit: Payer: Self-pay | Admitting: Family Medicine

## 2005-11-08 ENCOUNTER — Ambulatory Visit (HOSPITAL_COMMUNITY): Admission: RE | Admit: 2005-11-08 | Discharge: 2005-11-08 | Payer: Self-pay | Admitting: Family Medicine

## 2005-12-25 ENCOUNTER — Inpatient Hospital Stay (HOSPITAL_COMMUNITY): Admission: EM | Admit: 2005-12-25 | Discharge: 2006-01-02 | Payer: Self-pay | Admitting: Emergency Medicine

## 2005-12-25 ENCOUNTER — Ambulatory Visit: Payer: Self-pay | Admitting: Family Medicine

## 2006-01-11 ENCOUNTER — Ambulatory Visit: Payer: Self-pay | Admitting: Family Medicine

## 2006-01-20 ENCOUNTER — Ambulatory Visit: Payer: Self-pay | Admitting: Family Medicine

## 2006-02-07 ENCOUNTER — Ambulatory Visit: Payer: Self-pay | Admitting: Family Medicine

## 2006-02-14 ENCOUNTER — Ambulatory Visit: Payer: Self-pay | Admitting: Family Medicine

## 2006-02-14 ENCOUNTER — Inpatient Hospital Stay (HOSPITAL_COMMUNITY): Admission: EM | Admit: 2006-02-14 | Discharge: 2006-02-16 | Payer: Self-pay | Admitting: Emergency Medicine

## 2006-02-21 ENCOUNTER — Ambulatory Visit: Payer: Self-pay | Admitting: Family Medicine

## 2006-03-07 ENCOUNTER — Encounter: Admission: RE | Admit: 2006-03-07 | Discharge: 2006-03-07 | Payer: Self-pay | Admitting: Family Medicine

## 2006-03-21 ENCOUNTER — Ambulatory Visit: Payer: Self-pay | Admitting: Family Medicine

## 2006-04-25 ENCOUNTER — Ambulatory Visit: Payer: Self-pay | Admitting: Family Medicine

## 2006-05-30 ENCOUNTER — Ambulatory Visit: Payer: Self-pay | Admitting: Family Medicine

## 2006-05-31 HISTORY — PX: CARDIAC CATHETERIZATION: SHX172

## 2006-06-01 ENCOUNTER — Encounter: Admission: RE | Admit: 2006-06-01 | Discharge: 2006-06-01 | Payer: Self-pay | Admitting: Sports Medicine

## 2006-06-01 ENCOUNTER — Encounter: Admission: RE | Admit: 2006-06-01 | Discharge: 2006-06-01 | Payer: Self-pay | Admitting: Family Medicine

## 2006-06-09 ENCOUNTER — Inpatient Hospital Stay (HOSPITAL_COMMUNITY): Admission: AD | Admit: 2006-06-09 | Discharge: 2006-06-14 | Payer: Self-pay | Admitting: Cardiology

## 2006-07-04 ENCOUNTER — Ambulatory Visit: Payer: Self-pay | Admitting: Family Medicine

## 2006-07-13 ENCOUNTER — Ambulatory Visit: Payer: Self-pay | Admitting: Family Medicine

## 2006-08-08 ENCOUNTER — Ambulatory Visit: Payer: Self-pay | Admitting: Family Medicine

## 2006-08-29 ENCOUNTER — Ambulatory Visit: Payer: Self-pay | Admitting: Family Medicine

## 2006-09-11 ENCOUNTER — Encounter: Admission: RE | Admit: 2006-09-11 | Discharge: 2006-09-11 | Payer: Self-pay | Admitting: Gastroenterology

## 2006-09-19 ENCOUNTER — Ambulatory Visit: Payer: Self-pay | Admitting: Family Medicine

## 2006-09-30 DIAGNOSIS — Z9889 Other specified postprocedural states: Secondary | ICD-10-CM

## 2006-09-30 HISTORY — DX: Other specified postprocedural states: Z98.890

## 2006-10-12 ENCOUNTER — Ambulatory Visit: Payer: Self-pay | Admitting: Family Medicine

## 2006-11-07 ENCOUNTER — Ambulatory Visit: Payer: Self-pay | Admitting: Family Medicine

## 2006-11-07 LAB — CONVERTED CEMR LAB
ALT: 9 units/L (ref 0–35)
AST: 18 units/L (ref 0–37)
Alkaline Phosphatase: 85 units/L (ref 39–117)
Creatinine, Ser: 0.85 mg/dL (ref 0.40–1.20)
Glucose, Bld: 89 mg/dL (ref 70–99)
Potassium: 4.5 meq/L (ref 3.5–5.3)

## 2006-12-11 ENCOUNTER — Encounter: Payer: Self-pay | Admitting: Family Medicine

## 2006-12-11 ENCOUNTER — Ambulatory Visit: Payer: Self-pay | Admitting: Family Medicine

## 2006-12-28 DIAGNOSIS — J449 Chronic obstructive pulmonary disease, unspecified: Secondary | ICD-10-CM

## 2006-12-28 DIAGNOSIS — F411 Generalized anxiety disorder: Secondary | ICD-10-CM | POA: Insufficient documentation

## 2006-12-28 DIAGNOSIS — K219 Gastro-esophageal reflux disease without esophagitis: Secondary | ICD-10-CM

## 2006-12-28 DIAGNOSIS — E785 Hyperlipidemia, unspecified: Secondary | ICD-10-CM

## 2006-12-28 DIAGNOSIS — E039 Hypothyroidism, unspecified: Secondary | ICD-10-CM | POA: Insufficient documentation

## 2006-12-28 DIAGNOSIS — J4489 Other specified chronic obstructive pulmonary disease: Secondary | ICD-10-CM | POA: Insufficient documentation

## 2006-12-28 DIAGNOSIS — I5032 Chronic diastolic (congestive) heart failure: Secondary | ICD-10-CM

## 2006-12-28 DIAGNOSIS — E049 Nontoxic goiter, unspecified: Secondary | ICD-10-CM | POA: Insufficient documentation

## 2006-12-28 DIAGNOSIS — I10 Essential (primary) hypertension: Secondary | ICD-10-CM | POA: Insufficient documentation

## 2006-12-28 DIAGNOSIS — R269 Unspecified abnormalities of gait and mobility: Secondary | ICD-10-CM

## 2006-12-28 DIAGNOSIS — M810 Age-related osteoporosis without current pathological fracture: Secondary | ICD-10-CM | POA: Insufficient documentation

## 2006-12-28 DIAGNOSIS — Z8679 Personal history of other diseases of the circulatory system: Secondary | ICD-10-CM | POA: Insufficient documentation

## 2006-12-28 DIAGNOSIS — D510 Vitamin B12 deficiency anemia due to intrinsic factor deficiency: Secondary | ICD-10-CM

## 2006-12-28 DIAGNOSIS — M479 Spondylosis, unspecified: Secondary | ICD-10-CM | POA: Insufficient documentation

## 2006-12-29 ENCOUNTER — Encounter (INDEPENDENT_AMBULATORY_CARE_PROVIDER_SITE_OTHER): Payer: Self-pay | Admitting: *Deleted

## 2007-01-09 ENCOUNTER — Ambulatory Visit: Payer: Self-pay | Admitting: Family Medicine

## 2007-01-09 DIAGNOSIS — G609 Hereditary and idiopathic neuropathy, unspecified: Secondary | ICD-10-CM | POA: Insufficient documentation

## 2007-01-09 DIAGNOSIS — N760 Acute vaginitis: Secondary | ICD-10-CM | POA: Insufficient documentation

## 2007-01-09 DIAGNOSIS — M2010 Hallux valgus (acquired), unspecified foot: Secondary | ICD-10-CM | POA: Insufficient documentation

## 2007-01-09 LAB — CONVERTED CEMR LAB
HCT: 38.2 %
Hemoglobin: 12.9 g/dL

## 2007-01-11 LAB — CONVERTED CEMR LAB
Folate: >20 ng/mL
TSH: 0.754 microintl units/mL (ref 0.350–5.50)
Total CK: 55 units/L (ref 7–177)
Vitamin B-12: 209 pg/mL — ABNORMAL LOW (ref 211–911)

## 2007-01-12 ENCOUNTER — Telehealth: Payer: Self-pay | Admitting: Family Medicine

## 2007-01-12 ENCOUNTER — Ambulatory Visit: Payer: Self-pay | Admitting: Family Medicine

## 2007-01-12 ENCOUNTER — Encounter: Payer: Self-pay | Admitting: Family Medicine

## 2007-01-15 ENCOUNTER — Ambulatory Visit: Payer: Self-pay | Admitting: Family Medicine

## 2007-01-16 ENCOUNTER — Ambulatory Visit: Payer: Self-pay | Admitting: Sports Medicine

## 2007-01-17 ENCOUNTER — Ambulatory Visit: Payer: Self-pay | Admitting: Family Medicine

## 2007-01-18 ENCOUNTER — Ambulatory Visit: Payer: Self-pay | Admitting: Family Medicine

## 2007-01-25 ENCOUNTER — Ambulatory Visit: Payer: Self-pay | Admitting: Sports Medicine

## 2007-02-01 ENCOUNTER — Ambulatory Visit: Payer: Self-pay | Admitting: Sports Medicine

## 2007-02-06 ENCOUNTER — Telehealth: Payer: Self-pay | Admitting: *Deleted

## 2007-02-07 ENCOUNTER — Ambulatory Visit (HOSPITAL_COMMUNITY): Admission: RE | Admit: 2007-02-07 | Discharge: 2007-02-07 | Payer: Self-pay | Admitting: Family Medicine

## 2007-02-07 ENCOUNTER — Encounter: Payer: Self-pay | Admitting: Family Medicine

## 2007-02-07 ENCOUNTER — Ambulatory Visit: Payer: Self-pay | Admitting: Sports Medicine

## 2007-02-08 ENCOUNTER — Telehealth: Payer: Self-pay | Admitting: Family Medicine

## 2007-02-15 ENCOUNTER — Ambulatory Visit: Payer: Self-pay | Admitting: Family Medicine

## 2007-02-20 ENCOUNTER — Ambulatory Visit: Payer: Self-pay | Admitting: Family Medicine

## 2007-03-16 ENCOUNTER — Ambulatory Visit: Payer: Self-pay | Admitting: Family Medicine

## 2007-03-16 ENCOUNTER — Encounter: Payer: Self-pay | Admitting: Family Medicine

## 2007-03-16 LAB — CONVERTED CEMR LAB
HDL: 56 mg/dL (ref >39)
LDL Cholesterol: 53 mg/dL (ref 0–99)
Total CHOL/HDL Ratio: 2.4
Triglycerides: 113 mg/dL (ref <150)
VLDL: 23 mg/dL (ref 0–40)

## 2007-03-19 ENCOUNTER — Encounter: Payer: Self-pay | Admitting: Family Medicine

## 2007-03-20 ENCOUNTER — Ambulatory Visit: Payer: Self-pay | Admitting: Family Medicine

## 2007-03-23 ENCOUNTER — Encounter: Payer: Self-pay | Admitting: Family Medicine

## 2007-03-27 ENCOUNTER — Encounter: Payer: Self-pay | Admitting: *Deleted

## 2007-04-09 ENCOUNTER — Encounter: Admission: RE | Admit: 2007-04-09 | Discharge: 2007-04-09 | Payer: Self-pay | Admitting: Cardiology

## 2007-04-16 ENCOUNTER — Ambulatory Visit: Payer: Self-pay | Admitting: Family Medicine

## 2007-04-20 ENCOUNTER — Ambulatory Visit: Payer: Self-pay | Admitting: Family Medicine

## 2007-04-20 ENCOUNTER — Telehealth: Payer: Self-pay | Admitting: *Deleted

## 2007-04-24 ENCOUNTER — Telehealth (INDEPENDENT_AMBULATORY_CARE_PROVIDER_SITE_OTHER): Payer: Self-pay | Admitting: Family Medicine

## 2007-04-25 ENCOUNTER — Encounter: Payer: Self-pay | Admitting: Family Medicine

## 2007-04-30 ENCOUNTER — Telehealth: Payer: Self-pay | Admitting: *Deleted

## 2007-04-30 ENCOUNTER — Ambulatory Visit: Payer: Self-pay | Admitting: Sports Medicine

## 2007-04-30 ENCOUNTER — Encounter: Admission: RE | Admit: 2007-04-30 | Discharge: 2007-04-30 | Payer: Self-pay | Admitting: Sports Medicine

## 2007-05-09 ENCOUNTER — Telehealth: Payer: Self-pay | Admitting: Family Medicine

## 2007-05-09 ENCOUNTER — Encounter: Payer: Self-pay | Admitting: Family Medicine

## 2007-05-10 ENCOUNTER — Encounter: Admission: RE | Admit: 2007-05-10 | Discharge: 2007-05-10 | Payer: Self-pay | Admitting: Sports Medicine

## 2007-05-10 ENCOUNTER — Encounter: Payer: Self-pay | Admitting: Family Medicine

## 2007-05-14 ENCOUNTER — Encounter: Payer: Self-pay | Admitting: Family Medicine

## 2007-05-17 ENCOUNTER — Ambulatory Visit: Payer: Self-pay | Admitting: Family Medicine

## 2007-05-22 ENCOUNTER — Encounter: Payer: Self-pay | Admitting: *Deleted

## 2007-05-30 ENCOUNTER — Telehealth: Payer: Self-pay | Admitting: *Deleted

## 2007-05-31 ENCOUNTER — Ambulatory Visit: Payer: Self-pay | Admitting: Family Medicine

## 2007-06-25 ENCOUNTER — Encounter: Payer: Self-pay | Admitting: Family Medicine

## 2007-06-26 ENCOUNTER — Encounter: Payer: Self-pay | Admitting: Family Medicine

## 2007-06-28 ENCOUNTER — Ambulatory Visit: Payer: Self-pay | Admitting: Family Medicine

## 2007-06-28 ENCOUNTER — Encounter: Payer: Self-pay | Admitting: Family Medicine

## 2007-06-28 LAB — CONVERTED CEMR LAB
CO2: 23 meq/L (ref 19–32)
Chloride: 103 meq/L (ref 96–112)
Platelets: 210 10*3/uL (ref 150–400)
Potassium: 4.3 meq/L (ref 3.5–5.3)
RDW: 13.2 % (ref 11.5–14.0)
Sodium: 139 meq/L (ref 135–145)
WBC: 6.9 10*3/uL (ref 4.0–10.5)

## 2007-07-03 ENCOUNTER — Encounter: Payer: Self-pay | Admitting: Family Medicine

## 2007-07-12 ENCOUNTER — Telehealth: Payer: Self-pay | Admitting: *Deleted

## 2007-07-24 ENCOUNTER — Ambulatory Visit: Payer: Self-pay | Admitting: Family Medicine

## 2007-07-24 ENCOUNTER — Telehealth: Payer: Self-pay | Admitting: *Deleted

## 2007-07-24 LAB — CONVERTED CEMR LAB
Albumin: 4.9 g/dL (ref 3.5–5.2)
Alkaline Phosphatase: 71 units/L (ref 39–117)
BUN: 21 mg/dL (ref 6–23)
CO2: 26 meq/L (ref 19–32)
Direct LDL: 57 mg/dL
Glucose, Bld: 91 mg/dL (ref 70–99)
Hemoglobin: 13.9 g/dL (ref 12.0–15.0)
MCHC: 31.9 g/dL (ref 30.0–36.0)
MCV: 97.1 fL (ref 78.0–100.0)
RBC: 4.49 M/uL (ref 3.87–5.11)
Sodium: 142 meq/L (ref 135–145)
Total Bilirubin: 0.7 mg/dL (ref 0.3–1.2)
Total Protein: 8 g/dL (ref 6.0–8.3)
WBC: 7.7 10*3/uL (ref 4.0–10.5)

## 2007-07-27 ENCOUNTER — Encounter: Payer: Self-pay | Admitting: Family Medicine

## 2007-08-03 ENCOUNTER — Encounter: Payer: Self-pay | Admitting: *Deleted

## 2007-08-03 ENCOUNTER — Telehealth (INDEPENDENT_AMBULATORY_CARE_PROVIDER_SITE_OTHER): Payer: Self-pay | Admitting: *Deleted

## 2007-08-03 ENCOUNTER — Encounter: Payer: Self-pay | Admitting: Family Medicine

## 2007-08-03 ENCOUNTER — Ambulatory Visit: Payer: Self-pay | Admitting: Family Medicine

## 2007-08-03 LAB — CONVERTED CEMR LAB
Bilirubin Urine: NEGATIVE
Glucose, Urine, Semiquant: NEGATIVE
Protein, U semiquant: NEGATIVE
Specific Gravity, Urine: 1.01
pH: 6

## 2007-08-28 ENCOUNTER — Ambulatory Visit: Payer: Self-pay | Admitting: Family Medicine

## 2007-08-28 DIAGNOSIS — N3941 Urge incontinence: Secondary | ICD-10-CM | POA: Insufficient documentation

## 2007-09-20 ENCOUNTER — Encounter: Payer: Self-pay | Admitting: Family Medicine

## 2007-09-25 ENCOUNTER — Ambulatory Visit: Payer: Self-pay | Admitting: Family Medicine

## 2007-09-26 ENCOUNTER — Telehealth: Payer: Self-pay | Admitting: *Deleted

## 2007-10-15 ENCOUNTER — Ambulatory Visit: Payer: Self-pay | Admitting: Sports Medicine

## 2007-10-23 ENCOUNTER — Ambulatory Visit: Payer: Self-pay | Admitting: Family Medicine

## 2007-10-29 ENCOUNTER — Encounter: Payer: Self-pay | Admitting: *Deleted

## 2007-11-05 ENCOUNTER — Telehealth: Payer: Self-pay | Admitting: *Deleted

## 2007-11-07 ENCOUNTER — Encounter: Payer: Self-pay | Admitting: *Deleted

## 2007-11-08 ENCOUNTER — Telehealth: Payer: Self-pay | Admitting: *Deleted

## 2007-11-13 ENCOUNTER — Encounter: Payer: Self-pay | Admitting: Family Medicine

## 2007-11-15 ENCOUNTER — Encounter (HOSPITAL_COMMUNITY): Admission: RE | Admit: 2007-11-15 | Discharge: 2007-12-15 | Payer: Self-pay | Admitting: Orthopaedic Surgery

## 2007-11-23 ENCOUNTER — Telehealth: Payer: Self-pay | Admitting: *Deleted

## 2007-11-23 ENCOUNTER — Ambulatory Visit: Payer: Self-pay | Admitting: Family Medicine

## 2007-11-30 ENCOUNTER — Telehealth: Payer: Self-pay | Admitting: *Deleted

## 2007-12-25 ENCOUNTER — Ambulatory Visit: Payer: Self-pay | Admitting: Family Medicine

## 2007-12-28 ENCOUNTER — Telehealth: Payer: Self-pay | Admitting: Family Medicine

## 2008-01-01 ENCOUNTER — Telehealth: Payer: Self-pay | Admitting: Family Medicine

## 2008-01-22 ENCOUNTER — Ambulatory Visit: Payer: Self-pay | Admitting: Family Medicine

## 2008-02-19 ENCOUNTER — Ambulatory Visit: Payer: Self-pay | Admitting: Family Medicine

## 2008-02-19 LAB — CONVERTED CEMR LAB
BUN: 20 mg/dL (ref 6–23)
CO2: 28 meq/L (ref 19–32)
Calcium: 9.5 mg/dL (ref 8.4–10.5)
Chloride: 102 meq/L (ref 96–112)
Creatinine, Ser: 0.97 mg/dL (ref 0.40–1.20)
Glucose, Bld: 95 mg/dL (ref 70–99)

## 2008-03-25 ENCOUNTER — Ambulatory Visit: Payer: Self-pay | Admitting: Family Medicine

## 2008-03-26 ENCOUNTER — Encounter: Payer: Self-pay | Admitting: Family Medicine

## 2008-03-31 ENCOUNTER — Telehealth: Payer: Self-pay | Admitting: Family Medicine

## 2008-03-31 ENCOUNTER — Encounter: Payer: Self-pay | Admitting: Family Medicine

## 2008-04-01 ENCOUNTER — Encounter: Admission: RE | Admit: 2008-04-01 | Discharge: 2008-04-01 | Payer: Self-pay | Admitting: Orthopedic Surgery

## 2008-04-03 ENCOUNTER — Encounter: Payer: Self-pay | Admitting: Family Medicine

## 2008-04-07 ENCOUNTER — Ambulatory Visit: Payer: Self-pay | Admitting: Family Medicine

## 2008-04-20 ENCOUNTER — Encounter: Admission: RE | Admit: 2008-04-20 | Discharge: 2008-04-20 | Payer: Self-pay | Admitting: Orthopedic Surgery

## 2008-04-28 ENCOUNTER — Encounter: Admission: RE | Admit: 2008-04-28 | Discharge: 2008-04-28 | Payer: Self-pay | Admitting: Orthopedic Surgery

## 2008-04-29 ENCOUNTER — Ambulatory Visit: Payer: Self-pay | Admitting: Family Medicine

## 2008-04-29 DIAGNOSIS — M48061 Spinal stenosis, lumbar region without neurogenic claudication: Secondary | ICD-10-CM | POA: Insufficient documentation

## 2008-05-20 ENCOUNTER — Ambulatory Visit: Payer: Self-pay | Admitting: Family Medicine

## 2008-05-20 LAB — CONVERTED CEMR LAB
ALT: 12 units/L (ref 0–35)
AST: 22 units/L (ref 0–37)
Alkaline Phosphatase: 60 units/L (ref 39–117)
Glucose, Bld: 105 mg/dL — ABNORMAL HIGH (ref 70–99)
MCHC: 31.8 g/dL (ref 30.0–36.0)
MCV: 99.3 fL (ref 78.0–100.0)
Platelets: 181 10*3/uL (ref 150–400)
Potassium: 4.1 meq/L (ref 3.5–5.3)
RDW: 13.6 % (ref 11.5–15.5)
Sodium: 141 meq/L (ref 135–145)
Total Bilirubin: 0.6 mg/dL (ref 0.3–1.2)
Total Protein: 7.1 g/dL (ref 6.0–8.3)

## 2008-05-22 ENCOUNTER — Encounter: Payer: Self-pay | Admitting: Family Medicine

## 2008-06-20 ENCOUNTER — Encounter: Payer: Self-pay | Admitting: Family Medicine

## 2008-06-20 ENCOUNTER — Ambulatory Visit: Payer: Self-pay | Admitting: Family Medicine

## 2008-06-20 LAB — CONVERTED CEMR LAB
Cholesterol: 135 mg/dL (ref 0–200)
LDL Cholesterol: 50 mg/dL (ref 0–99)
Total CHOL/HDL Ratio: 2.5
VLDL: 30 mg/dL (ref 0–40)

## 2008-06-22 ENCOUNTER — Emergency Department (HOSPITAL_COMMUNITY): Admission: EM | Admit: 2008-06-22 | Discharge: 2008-06-22 | Payer: Self-pay | Admitting: Emergency Medicine

## 2008-06-22 LAB — CONVERTED CEMR LAB: Nitrite: NEGATIVE

## 2008-06-23 ENCOUNTER — Encounter: Payer: Self-pay | Admitting: Family Medicine

## 2008-06-24 ENCOUNTER — Ambulatory Visit: Payer: Self-pay | Admitting: Family Medicine

## 2008-06-24 LAB — CONVERTED CEMR LAB: Hemoglobin: 11.8 g/dL

## 2008-07-03 ENCOUNTER — Encounter: Payer: Self-pay | Admitting: Family Medicine

## 2008-07-03 ENCOUNTER — Encounter: Admission: RE | Admit: 2008-07-03 | Discharge: 2008-07-03 | Payer: Self-pay | Admitting: Family Medicine

## 2008-07-08 ENCOUNTER — Ambulatory Visit: Payer: Self-pay | Admitting: Family Medicine

## 2008-07-21 ENCOUNTER — Ambulatory Visit: Payer: Self-pay | Admitting: Family Medicine

## 2008-07-23 ENCOUNTER — Encounter: Payer: Self-pay | Admitting: Family Medicine

## 2008-07-25 ENCOUNTER — Encounter: Payer: Self-pay | Admitting: Family Medicine

## 2008-08-18 ENCOUNTER — Encounter: Payer: Self-pay | Admitting: Family Medicine

## 2008-08-21 ENCOUNTER — Ambulatory Visit: Payer: Self-pay | Admitting: Family Medicine

## 2008-08-21 LAB — CONVERTED CEMR LAB
AST: 21 units/L (ref 0–37)
Albumin: 4.5 g/dL (ref 3.5–5.2)
Alkaline Phosphatase: 75 units/L (ref 39–117)
MCHC: 30.5 g/dL (ref 30.0–36.0)
Potassium: 4.6 meq/L (ref 3.5–5.3)
RBC: 3.81 M/uL — ABNORMAL LOW (ref 3.87–5.11)
RDW: 12.6 % (ref 11.5–15.5)
Sodium: 141 meq/L (ref 135–145)
Total Protein: 7.2 g/dL (ref 6.0–8.3)
Whiff Test: NEGATIVE

## 2008-08-22 ENCOUNTER — Telehealth: Payer: Self-pay | Admitting: Family Medicine

## 2008-08-22 ENCOUNTER — Encounter: Payer: Self-pay | Admitting: Family Medicine

## 2008-09-05 ENCOUNTER — Telehealth: Payer: Self-pay | Admitting: *Deleted

## 2008-09-05 ENCOUNTER — Ambulatory Visit: Payer: Self-pay | Admitting: Family Medicine

## 2008-09-05 DIAGNOSIS — G20 Parkinson's disease: Secondary | ICD-10-CM

## 2008-09-08 ENCOUNTER — Encounter: Payer: Self-pay | Admitting: Family Medicine

## 2008-09-14 ENCOUNTER — Encounter: Payer: Self-pay | Admitting: Family Medicine

## 2008-09-18 ENCOUNTER — Ambulatory Visit: Payer: Self-pay | Admitting: Family Medicine

## 2008-09-18 DIAGNOSIS — IMO0001 Reserved for inherently not codable concepts without codable children: Secondary | ICD-10-CM

## 2008-09-23 ENCOUNTER — Telehealth: Payer: Self-pay | Admitting: Family Medicine

## 2008-09-24 ENCOUNTER — Telehealth: Payer: Self-pay | Admitting: Family Medicine

## 2008-09-29 ENCOUNTER — Encounter: Payer: Self-pay | Admitting: Family Medicine

## 2008-10-06 ENCOUNTER — Telehealth: Payer: Self-pay | Admitting: *Deleted

## 2008-10-07 ENCOUNTER — Ambulatory Visit: Payer: Self-pay | Admitting: Family Medicine

## 2008-10-16 ENCOUNTER — Telehealth: Payer: Self-pay | Admitting: Family Medicine

## 2008-10-27 ENCOUNTER — Ambulatory Visit: Payer: Self-pay | Admitting: Family Medicine

## 2008-11-06 ENCOUNTER — Ambulatory Visit: Payer: Self-pay | Admitting: Family Medicine

## 2008-11-06 LAB — CONVERTED CEMR LAB
Calcium: 10 mg/dL (ref 8.4–10.5)
Creatinine, Ser: 0.92 mg/dL (ref 0.40–1.20)
HCT: 40.8 % (ref 36.0–46.0)
MCHC: 31.6 g/dL (ref 30.0–36.0)
MCV: 97.1 fL (ref 78.0–100.0)
Platelets: 207 10*3/uL (ref 150–400)
RDW: 13.1 % (ref 11.5–15.5)

## 2008-11-15 ENCOUNTER — Encounter: Payer: Self-pay | Admitting: Family Medicine

## 2008-11-19 ENCOUNTER — Telehealth: Payer: Self-pay | Admitting: Family Medicine

## 2008-12-02 ENCOUNTER — Ambulatory Visit: Payer: Self-pay | Admitting: Family Medicine

## 2009-01-08 ENCOUNTER — Encounter: Payer: Self-pay | Admitting: *Deleted

## 2009-01-08 ENCOUNTER — Ambulatory Visit: Payer: Self-pay | Admitting: Family Medicine

## 2009-01-27 ENCOUNTER — Emergency Department (HOSPITAL_COMMUNITY): Admission: EM | Admit: 2009-01-27 | Discharge: 2009-01-27 | Payer: Self-pay | Admitting: Emergency Medicine

## 2009-01-29 ENCOUNTER — Ambulatory Visit: Payer: Self-pay | Admitting: Family Medicine

## 2009-02-11 ENCOUNTER — Encounter: Admission: RE | Admit: 2009-02-11 | Discharge: 2009-02-11 | Payer: Self-pay | Admitting: Orthopedic Surgery

## 2009-03-03 ENCOUNTER — Ambulatory Visit: Payer: Self-pay | Admitting: Family Medicine

## 2009-03-12 ENCOUNTER — Encounter: Payer: Self-pay | Admitting: Family Medicine

## 2009-03-12 ENCOUNTER — Telehealth: Payer: Self-pay | Admitting: *Deleted

## 2009-03-12 ENCOUNTER — Ambulatory Visit: Payer: Self-pay | Admitting: Family Medicine

## 2009-03-12 LAB — CONVERTED CEMR LAB
BUN: 20 mg/dL (ref 6–23)
Potassium: 4.7 meq/L (ref 3.5–5.3)
Sodium: 141 meq/L (ref 135–145)

## 2009-03-17 ENCOUNTER — Telehealth: Payer: Self-pay | Admitting: Family Medicine

## 2009-03-20 ENCOUNTER — Telehealth: Payer: Self-pay | Admitting: Family Medicine

## 2009-03-20 ENCOUNTER — Ambulatory Visit: Payer: Self-pay | Admitting: Family Medicine

## 2009-03-20 LAB — CONVERTED CEMR LAB
Bilirubin Urine: NEGATIVE
Blood in Urine, dipstick: NEGATIVE
Glucose, Urine, Semiquant: NEGATIVE
Protein, U semiquant: NEGATIVE
pH: 6

## 2009-03-21 ENCOUNTER — Encounter: Payer: Self-pay | Admitting: Family Medicine

## 2009-04-06 ENCOUNTER — Encounter (INDEPENDENT_AMBULATORY_CARE_PROVIDER_SITE_OTHER): Payer: Self-pay | Admitting: *Deleted

## 2009-04-06 ENCOUNTER — Ambulatory Visit: Payer: Self-pay | Admitting: Family Medicine

## 2009-04-09 ENCOUNTER — Ambulatory Visit: Payer: Self-pay | Admitting: Family Medicine

## 2009-04-15 ENCOUNTER — Encounter: Payer: Self-pay | Admitting: Family Medicine

## 2009-05-12 ENCOUNTER — Ambulatory Visit: Payer: Self-pay | Admitting: Family Medicine

## 2009-05-21 ENCOUNTER — Ambulatory Visit: Payer: Self-pay | Admitting: Family Medicine

## 2009-05-22 ENCOUNTER — Telehealth: Payer: Self-pay | Admitting: Family Medicine

## 2009-06-25 ENCOUNTER — Ambulatory Visit: Payer: Self-pay | Admitting: Family Medicine

## 2009-08-06 ENCOUNTER — Telehealth: Payer: Self-pay | Admitting: Family Medicine

## 2009-08-07 ENCOUNTER — Ambulatory Visit: Payer: Self-pay | Admitting: Family Medicine

## 2009-08-14 ENCOUNTER — Telehealth: Payer: Self-pay | Admitting: Family Medicine

## 2009-08-15 ENCOUNTER — Emergency Department (HOSPITAL_COMMUNITY): Admission: EM | Admit: 2009-08-15 | Discharge: 2009-08-15 | Payer: Self-pay | Admitting: Family Medicine

## 2009-08-17 ENCOUNTER — Encounter: Payer: Self-pay | Admitting: Family Medicine

## 2009-08-17 ENCOUNTER — Ambulatory Visit: Payer: Self-pay | Admitting: Family Medicine

## 2009-08-17 LAB — CONVERTED CEMR LAB
CO2: 23 meq/L (ref 19–32)
Calcium: 10 mg/dL (ref 8.4–10.5)
Sodium: 141 meq/L (ref 135–145)

## 2009-08-27 ENCOUNTER — Ambulatory Visit: Payer: Self-pay | Admitting: Family Medicine

## 2009-09-18 ENCOUNTER — Ambulatory Visit: Payer: Self-pay | Admitting: Family Medicine

## 2009-09-22 ENCOUNTER — Encounter: Payer: Self-pay | Admitting: Family Medicine

## 2009-09-28 ENCOUNTER — Telehealth: Payer: Self-pay | Admitting: *Deleted

## 2009-10-06 ENCOUNTER — Ambulatory Visit: Payer: Self-pay | Admitting: Family Medicine

## 2009-10-07 DIAGNOSIS — N183 Chronic kidney disease, stage 3 (moderate): Secondary | ICD-10-CM

## 2009-10-07 LAB — CONVERTED CEMR LAB
AST: 20 units/L (ref 0–37)
BUN: 26 mg/dL — ABNORMAL HIGH (ref 6–23)
CO2: 24 meq/L (ref 19–32)
Calcium: 9.5 mg/dL (ref 8.4–10.5)
Chloride: 102 meq/L (ref 96–112)
Creatinine, Ser: 1.11 mg/dL (ref 0.40–1.20)
Direct LDL: 51 mg/dL
Ferritin: 108 ng/mL (ref 10–291)
Glucose, Bld: 98 mg/dL (ref 70–99)
HCT: 36.9 % (ref 36.0–46.0)
Hemoglobin: 12 g/dL (ref 12.0–15.0)
RDW: 12.8 % (ref 11.5–15.5)
TSH: 1.045 microintl units/mL (ref 0.350–4.500)

## 2009-10-14 ENCOUNTER — Encounter: Admission: RE | Admit: 2009-10-14 | Discharge: 2009-10-14 | Payer: Self-pay | Admitting: Orthopedic Surgery

## 2009-10-27 ENCOUNTER — Inpatient Hospital Stay (HOSPITAL_COMMUNITY): Admission: EM | Admit: 2009-10-27 | Discharge: 2009-10-29 | Payer: Self-pay | Admitting: Emergency Medicine

## 2009-10-27 ENCOUNTER — Encounter: Payer: Self-pay | Admitting: Family Medicine

## 2009-10-27 ENCOUNTER — Ambulatory Visit: Payer: Self-pay | Admitting: Family Medicine

## 2009-11-03 ENCOUNTER — Ambulatory Visit: Payer: Self-pay | Admitting: Family Medicine

## 2009-11-16 ENCOUNTER — Encounter: Payer: Self-pay | Admitting: Family Medicine

## 2009-11-26 ENCOUNTER — Ambulatory Visit: Payer: Self-pay | Admitting: Family Medicine

## 2009-12-17 ENCOUNTER — Ambulatory Visit: Payer: Self-pay | Admitting: Family Medicine

## 2009-12-21 ENCOUNTER — Telehealth: Payer: Self-pay | Admitting: Family Medicine

## 2009-12-25 ENCOUNTER — Ambulatory Visit (HOSPITAL_COMMUNITY): Admission: RE | Admit: 2009-12-25 | Discharge: 2009-12-25 | Payer: Self-pay | Admitting: Cardiology

## 2010-01-14 ENCOUNTER — Ambulatory Visit: Payer: Self-pay | Admitting: Family Medicine

## 2010-01-22 ENCOUNTER — Telehealth: Payer: Self-pay | Admitting: Family Medicine

## 2010-02-01 ENCOUNTER — Telehealth: Payer: Self-pay | Admitting: Family Medicine

## 2010-02-04 ENCOUNTER — Ambulatory Visit: Payer: Self-pay | Admitting: Family Medicine

## 2010-02-25 ENCOUNTER — Ambulatory Visit: Payer: Self-pay | Admitting: Family Medicine

## 2010-02-26 ENCOUNTER — Encounter: Payer: Self-pay | Admitting: Family Medicine

## 2010-03-01 ENCOUNTER — Ambulatory Visit: Payer: Self-pay | Admitting: Family Medicine

## 2010-03-03 ENCOUNTER — Ambulatory Visit: Payer: Self-pay | Admitting: Family Medicine

## 2010-03-08 ENCOUNTER — Encounter: Payer: Self-pay | Admitting: Family Medicine

## 2010-03-10 ENCOUNTER — Encounter: Payer: Self-pay | Admitting: Family Medicine

## 2010-03-11 ENCOUNTER — Ambulatory Visit: Payer: Self-pay | Admitting: Family Medicine

## 2010-03-18 ENCOUNTER — Encounter: Payer: Self-pay | Admitting: Family Medicine

## 2010-03-26 ENCOUNTER — Telehealth: Payer: Self-pay | Admitting: Family Medicine

## 2010-03-26 ENCOUNTER — Encounter: Payer: Self-pay | Admitting: *Deleted

## 2010-03-30 ENCOUNTER — Encounter: Admission: RE | Admit: 2010-03-30 | Discharge: 2010-03-30 | Payer: Self-pay | Admitting: Orthopedic Surgery

## 2010-04-02 ENCOUNTER — Telehealth: Payer: Self-pay | Admitting: Family Medicine

## 2010-04-02 ENCOUNTER — Ambulatory Visit: Payer: Self-pay | Admitting: Family Medicine

## 2010-04-02 DIAGNOSIS — N39498 Other specified urinary incontinence: Secondary | ICD-10-CM

## 2010-04-02 LAB — CONVERTED CEMR LAB
Glucose, Urine, Semiquant: NEGATIVE
Ketones, urine, test strip: NEGATIVE
WBC, UA: >20 cells/hpf

## 2010-04-08 ENCOUNTER — Telehealth: Payer: Self-pay | Admitting: Family Medicine

## 2010-04-09 ENCOUNTER — Ambulatory Visit: Payer: Self-pay | Admitting: Family Medicine

## 2010-04-09 ENCOUNTER — Encounter: Admission: RE | Admit: 2010-04-09 | Discharge: 2010-04-09 | Payer: Self-pay | Admitting: Family Medicine

## 2010-04-09 DIAGNOSIS — M545 Low back pain: Secondary | ICD-10-CM

## 2010-04-15 ENCOUNTER — Ambulatory Visit: Payer: Self-pay | Admitting: Family Medicine

## 2010-04-22 ENCOUNTER — Encounter: Payer: Self-pay | Admitting: Family Medicine

## 2010-04-22 ENCOUNTER — Observation Stay (HOSPITAL_COMMUNITY): Admission: EM | Admit: 2010-04-22 | Discharge: 2010-04-22 | Payer: Self-pay | Admitting: Emergency Medicine

## 2010-04-22 ENCOUNTER — Ambulatory Visit: Payer: Self-pay | Admitting: Family Medicine

## 2010-04-30 ENCOUNTER — Encounter: Payer: Self-pay | Admitting: Family Medicine

## 2010-05-05 ENCOUNTER — Encounter: Payer: Self-pay | Admitting: Family Medicine

## 2010-05-11 ENCOUNTER — Encounter: Payer: Self-pay | Admitting: Family Medicine

## 2010-05-27 ENCOUNTER — Ambulatory Visit: Payer: Self-pay | Admitting: Family Medicine

## 2010-06-14 ENCOUNTER — Telehealth: Payer: Self-pay | Admitting: Family Medicine

## 2010-06-14 ENCOUNTER — Ambulatory Visit: Payer: Self-pay | Admitting: Family Medicine

## 2010-06-14 ENCOUNTER — Ambulatory Visit (HOSPITAL_COMMUNITY): Admission: RE | Admit: 2010-06-14 | Discharge: 2010-06-14 | Payer: Self-pay | Admitting: Family Medicine

## 2010-06-14 DIAGNOSIS — H8110 Benign paroxysmal vertigo, unspecified ear: Secondary | ICD-10-CM | POA: Insufficient documentation

## 2010-06-14 LAB — CONVERTED CEMR LAB
ALT: <8 units/L (ref 0–35)
Albumin: 4.6 g/dL (ref 3.5–5.2)
CO2: 27 meq/L (ref 19–32)
MCV: 99 fL (ref 78.0–100.0)
Platelets: 237 10*3/uL (ref 150–400)
Potassium: 4.9 meq/L (ref 3.5–5.3)
Prealbumin: 26.6 mg/dL (ref 18.0–45.0)
Sodium: 139 meq/L (ref 135–145)
Total Bilirubin: 0.5 mg/dL (ref 0.3–1.2)
Total Protein: 7.2 g/dL (ref 6.0–8.3)
WBC: 8 10*3/uL (ref 4.0–10.5)

## 2010-06-15 ENCOUNTER — Encounter: Payer: Self-pay | Admitting: Family Medicine

## 2010-06-23 ENCOUNTER — Emergency Department (HOSPITAL_COMMUNITY): Admission: EM | Admit: 2010-06-23 | Discharge: 2010-06-23 | Payer: Self-pay | Admitting: Emergency Medicine

## 2010-06-24 ENCOUNTER — Ambulatory Visit: Payer: Self-pay | Admitting: Family Medicine

## 2010-06-25 ENCOUNTER — Encounter: Payer: Self-pay | Admitting: Family Medicine

## 2010-06-28 ENCOUNTER — Telehealth: Payer: Self-pay | Admitting: Family Medicine

## 2010-07-01 ENCOUNTER — Ambulatory Visit: Payer: Self-pay | Admitting: Family Medicine

## 2010-07-29 ENCOUNTER — Ambulatory Visit: Payer: Self-pay | Admitting: Family Medicine

## 2010-07-30 ENCOUNTER — Encounter: Payer: Self-pay | Admitting: *Deleted

## 2010-08-04 ENCOUNTER — Telehealth: Payer: Self-pay | Admitting: Family Medicine

## 2010-08-04 DIAGNOSIS — H905 Unspecified sensorineural hearing loss: Secondary | ICD-10-CM | POA: Insufficient documentation

## 2010-08-19 ENCOUNTER — Encounter
Admission: RE | Admit: 2010-08-19 | Discharge: 2010-10-11 | Payer: Self-pay | Source: Home / Self Care | Attending: Family Medicine | Admitting: Family Medicine

## 2010-08-19 ENCOUNTER — Encounter: Payer: Self-pay | Admitting: Family Medicine

## 2010-08-20 ENCOUNTER — Encounter: Payer: Self-pay | Admitting: Family Medicine

## 2010-08-20 ENCOUNTER — Telehealth: Payer: Self-pay | Admitting: Family Medicine

## 2010-08-24 ENCOUNTER — Encounter: Payer: Self-pay | Admitting: Family Medicine

## 2010-08-25 ENCOUNTER — Encounter: Payer: Self-pay | Admitting: Family Medicine

## 2010-09-15 ENCOUNTER — Telehealth: Payer: Self-pay | Admitting: Family Medicine

## 2010-09-17 ENCOUNTER — Encounter: Payer: Self-pay | Admitting: Family Medicine

## 2010-09-30 ENCOUNTER — Ambulatory Visit: Payer: Self-pay | Admitting: Family Medicine

## 2010-09-30 LAB — CONVERTED CEMR LAB
CO2: 28 meq/L (ref 19–32)
Calcium: 9.1 mg/dL (ref 8.4–10.5)
Creatinine, Ser: 0.99 mg/dL (ref 0.40–1.20)
Direct LDL: 47 mg/dL
Glucose, Bld: 97 mg/dL (ref 70–99)
Hemoglobin: 11.7 g/dL — ABNORMAL LOW (ref 12.0–15.0)
Platelets: 219 10*3/uL (ref 150–400)
RDW: 12.4 % (ref 11.5–15.5)

## 2010-10-07 ENCOUNTER — Emergency Department (HOSPITAL_COMMUNITY)
Admission: EM | Admit: 2010-10-07 | Discharge: 2010-10-07 | Payer: Self-pay | Source: Home / Self Care | Admitting: Emergency Medicine

## 2010-10-26 ENCOUNTER — Ambulatory Visit
Admission: RE | Admit: 2010-10-26 | Discharge: 2010-10-26 | Payer: Self-pay | Source: Home / Self Care | Attending: Family Medicine | Admitting: Family Medicine

## 2010-10-26 ENCOUNTER — Encounter: Payer: Self-pay | Admitting: Family Medicine

## 2010-10-26 ENCOUNTER — Ambulatory Visit (HOSPITAL_COMMUNITY)
Admission: RE | Admit: 2010-10-26 | Discharge: 2010-10-26 | Payer: Self-pay | Source: Home / Self Care | Attending: Family Medicine | Admitting: Family Medicine

## 2010-10-28 ENCOUNTER — Telehealth: Payer: Self-pay | Admitting: Family Medicine

## 2010-11-02 ENCOUNTER — Encounter: Payer: Self-pay | Admitting: Family Medicine

## 2010-11-11 ENCOUNTER — Encounter: Payer: Self-pay | Admitting: Family Medicine

## 2010-11-11 ENCOUNTER — Ambulatory Visit
Admission: RE | Admit: 2010-11-11 | Discharge: 2010-11-11 | Payer: Self-pay | Source: Home / Self Care | Attending: Family Medicine | Admitting: Family Medicine

## 2010-11-11 ENCOUNTER — Ambulatory Visit (HOSPITAL_COMMUNITY)
Admission: RE | Admit: 2010-11-11 | Discharge: 2010-11-11 | Payer: Self-pay | Source: Home / Self Care | Attending: Family Medicine | Admitting: Family Medicine

## 2010-11-11 DIAGNOSIS — R0602 Shortness of breath: Secondary | ICD-10-CM | POA: Insufficient documentation

## 2010-11-11 LAB — CONVERTED CEMR LAB
Platelets: 216 10*3/uL (ref 150–400)
RDW: 12.5 % (ref 11.5–15.5)

## 2010-11-12 ENCOUNTER — Telehealth: Payer: Self-pay | Admitting: *Deleted

## 2010-11-17 ENCOUNTER — Emergency Department (HOSPITAL_COMMUNITY)
Admission: EM | Admit: 2010-11-17 | Discharge: 2010-11-17 | Payer: Self-pay | Source: Home / Self Care | Admitting: Emergency Medicine

## 2010-11-18 ENCOUNTER — Ambulatory Visit
Admission: RE | Admit: 2010-11-18 | Discharge: 2010-11-18 | Payer: Self-pay | Source: Home / Self Care | Attending: Family Medicine | Admitting: Family Medicine

## 2010-11-22 LAB — CBC
MCHC: 31.6 g/dL (ref 30.0–36.0)
MCV: 97.6 fL (ref 78.0–100.0)
Platelets: 239 10*3/uL (ref 150–400)
RDW: 12 % (ref 11.5–15.5)

## 2010-11-22 LAB — DIFFERENTIAL
Basophils Relative: 1 % (ref 0–1)
Lymphocytes Relative: 35 % (ref 12–46)
Neutro Abs: 3.1 10*3/uL (ref 1.7–7.7)

## 2010-11-22 LAB — POCT I-STAT, CHEM 8
BUN: 17 mg/dL (ref 6–23)
Calcium, Ion: 1.19 mmol/L (ref 1.12–1.32)
Chloride: 108 mEq/L (ref 96–112)
Glucose, Bld: 106 mg/dL — ABNORMAL HIGH (ref 70–99)
Sodium: 142 mEq/L (ref 135–145)

## 2010-11-25 ENCOUNTER — Telehealth: Payer: Self-pay | Admitting: *Deleted

## 2010-11-26 ENCOUNTER — Telehealth: Payer: Self-pay | Admitting: Family Medicine

## 2010-11-30 NOTE — Progress Notes (Signed)
Summary: FU visit for tremor  Phone Note Outgoing Call   Call placed by: Zachery Dauer MD,  February 01, 2010 10:33 AM Summary of Call: Mrs Rathel had retried the Sinemet but again had GI upset. She had diarrhea for several days but now better. It had helped the tremor. Since her pulse and blood pressure have been increased I asked her to increase the Metoprolol ER to 2 each evening. We will move her next week appointment to [facility] clinic this week and assess her blood pressure, pulse, breathing and tremor then. She voiced understanding of the 2 tab daily dose.  Initial call taken by: patient

## 2010-11-30 NOTE — Assessment & Plan Note (Signed)
Summary: f/u visit/bmc   Primary Care Provider:  Zachery Dauer MD   History of Present Illness: Here for IVP Boniva, denies leg pain.  B12 shot due, and pick up narctoic script.  Feeling back to her old self.  Lack of energy  Current Medications (verified): 1)  Levothroid 75 Mcg Tabs (Levothyroxine Sodium) .... Take 1 Tablet By Mouth Once A Day At Bedtime 2)  Boniva 3 Mg/25ml  Kit (Ibandronate Sodium) .... Inject Every 3 Months 3)  Furosemide 40 Mg  Tabs (Furosemide) .... Take One in A.m. 4)  Isosorbide Mononitrate Cr 30 Mg Xr24h-Tab (Isosorbide Mononitrate) .... Take One Tablet Daily 5)  Plavix 75 Mg Tabs (Clopidogrel Bisulfate) .... Take 1 Tablet By Mouth Once A Day 6)  Potassium Chloride Crys Cr 20 Meq Tbcr (Potassium Chloride Crys Cr) .... Take 1 Tablet By Mouth Once A Day 7)  Simvastatin 20 Mg Tabs (Simvastatin) .... Take 1 Tablet By Mouth Every Night 8)  Sm Calcium/vitamin D 500-200 Mg-Unit Tabs (Calcium Carbonate-Vitamin D) .... Take 1 Tablet By Mouth Twice A Day 9)  Zyrtec Allergy 10 Mg Tabs (Cetirizine Hcl) .Marland Kitchen.. 1 Tablet By Mouth Once A Day 10)  Nitroglycerin 0.4 Mg Subl (Nitroglycerin) .... Take 1 Tab Sl As Needed Chest Pain 11)  Proair Hfa 108 (90 Base) Mcg/act Aers (Albuterol Sulfate) .... 2 Puffs Every 4 Hour Prn 12)  Folic Acid 1 Mg  Tabs (Folic Acid) .... One Dialy 13)  Toprol Xl 25 Mg Xr24h-Tab (Metoprolol Succinate) .... Take 2 Tabs By Mouth Daily 14)  Premarin 0.625 Mg/gm Crea (Estrogens, Conjugated) .... Apply Daily Vaginally 15)  Ferrous Sulfate 324 Mg Tbec (Ferrous Sulfate) .... Take One Tablet Daily 16)  Anusol-Hc 2.5 % Crea (Hydrocortisone) .... Apply Two Times A Day 17)  Baby Aspirin 81 Mg Chew (Aspirin) .... Take One Tablet Daily 18)  Vitamin D 1000 Unit Tabs (Cholecalciferol) .... Take One Tablet Daily 19)  Nexium 40 Mg Cpdr (Esomeprazole Magnesium) .... One Daily 20)  Oxycodone-Acetaminophen 5-325 Mg Tabs (Oxycodone-Acetaminophen) .Marland Kitchen.. 1 Tab By Mouth Q 4 Hours  As Needed Pain  Allergies (verified): 1)  ! * Sinemet 2)  Bactrim Ds (Sulfamethoxazole-Trimethoprim) 3)  Gabapentin (Gabapentin) 4)  Codeine 5)  Effexor Xr (Venlafaxine Hcl) 6)  Cymbalta (Duloxetine Hcl) 7)  Lipitor (Atorvastatin Calcium)  Physical Exam  General:  Looked well, groomed, and alert.  Using walker. Lungs:  normal respiratory effort and normal breath sounds.   Heart:  normal rate, regular rhythm, and grade 2 /6 HSM.   Extremities:  no edema Psych:  normally interactive and good eye contact.     Impression & Recommendations:  Problem # 1:  OSTEOPOROSIS, UNSPECIFIED (ICD-733.00)  IVP Boniva today, no complications, denies leg pain. Sandrea Hammond Lot [id number]  exp 9/12 Her updated medication list for this problem includes:    Boniva 3 Mg/66ml Kit (Ibandronate sodium) ..... Inject every 3 months    Sm Calcium/vitamin D 500-200 Mg-unit Tabs (Calcium carbonate-vitamin d) .Marland Kitchen... Take 1 tablet by mouth twice a day    Vitamin D 1000 Unit Tabs (Cholecalciferol) .Marland Kitchen... Take one tablet daily  Orders[facility] ([facility]  Her updated medication list for this problem includes:    Boniva 3 Mg/56ml Kit (Ibandronate sodium) ..... Inject every 3 months    Sm Calcium/vitamin D 500-200 Mg-unit Tabs (Calcium carbonate-vitamin d) .Marland Kitchen... Take 1 tablet by mouth twice a day    Vitamin D 1000 Unit Tabs (Cholecalciferol) .Marland Kitchen... Take one tablet daily  Problem #  2:  LOW BACK PAIN SYNDROME, SEVERE (ICD-724.2)  PIcked up script for Percocet, Oxycontin discontinued. The following medications were removed from the medication list:    Oxycontin 10 Mg Xr12h-tab (Oxycodone hcl) .Marland Kitchen... Take 1 tab q 12 hours Her updated medication list for this problem includes:    Baby Aspirin 81 Mg Chew (Aspirin) .Marland Kitchen... Take one tablet daily    Oxycodone-acetaminophen 5-325 Mg Tabs (Oxycodone-acetaminophen) .Marland Kitchen... 1 tab by mouth q 4 hours as needed pain  The following medications were removed from the  medication list:    Oxycontin 10 Mg Xr12h-tab (Oxycodone hcl) .Marland Kitchen... Take 1 tab q 12 hours Her updated medication list for this problem includes:    Baby Aspirin 81 Mg Chew (Aspirin) .Marland Kitchen... Take one tablet daily    Oxycodone-acetaminophen 5-325 Mg Tabs (Oxycodone-acetaminophen) .Marland Kitchen... 1 tab by mouth q 4 hours as needed pain  Problem # 3:  PERNICIOUS ANEMIA (ICD-281.0)  B12 shot given  Her updated medication list for this problem includes:    Folic Acid 1 Mg Tabs (Folic acid) ..... One dialy    Ferrous Sulfate 324 Mg Tbec (Ferrous sulfate) .Marland Kitchen... Take one tablet daily  Her updated medication list for this problem includes:    Folic Acid 1 Mg Tabs (Folic acid) ..... One dialy    Ferrous Sulfate 324 Mg Tbec (Ferrous sulfate) .Marland Kitchen... Take one tablet daily  Complete Medication List: 1)  Levothroid 75 Mcg Tabs (Levothyroxine sodium) .... Take 1 tablet by mouth once a day at bedtime 2)  Boniva 3 Mg/48ml Kit (Ibandronate sodium) .... Inject every 3 months 3)  Furosemide 40 Mg Tabs (Furosemide) .... Take one in a.m. 4)  Isosorbide Mononitrate Cr 30 Mg Xr24h-tab (Isosorbide mononitrate) .... Take one tablet daily 5)  Plavix 75 Mg Tabs (Clopidogrel bisulfate) .... Take 1 tablet by mouth once a day 6)  Potassium Chloride Crys Cr 20 Meq Tbcr (Potassium chloride crys cr) .... Take 1 tablet by mouth once a day 7)  Simvastatin 20 Mg Tabs (Simvastatin) .... Take 1 tablet by mouth every night 8)  Sm Calcium/vitamin D 500-200 Mg-unit Tabs (Calcium carbonate-vitamin d) .... Take 1 tablet by mouth twice a day 9)  Zyrtec Allergy 10 Mg Tabs (Cetirizine hcl) .Marland Kitchen.. 1 tablet by mouth once a day 10)  Nitroglycerin 0.4 Mg Subl (Nitroglycerin) .... Take 1 tab sl as needed chest pain 11)  Proair Hfa 108 (90 Base) Mcg/act Aers (Albuterol sulfate) .... 2 puffs every 4 hour prn 12)  Folic Acid 1 Mg Tabs (Folic acid) .... One dialy 13)  Toprol Xl 25 Mg Xr24h-tab (Metoprolol succinate) .... Take 2 tabs by mouth daily 14)   Premarin 0.625 Mg/gm Crea (Estrogens, conjugated) .... Apply daily vaginally 15)  Ferrous Sulfate 324 Mg Tbec (Ferrous sulfate) .... Take one tablet daily 16)  Anusol-hc 2.5 % Crea (Hydrocortisone) .... Apply two times a day 17)  Baby Aspirin 81 Mg Chew (Aspirin) .... Take one tablet daily 18)  Vitamin D 1000 Unit Tabs (Cholecalciferol) .... Take one tablet daily 19)  Nexium 40 Mg Cpdr (Esomeprazole magnesium) .... One daily 20)  Oxycodone-acetaminophen 5-325 Mg Tabs (Oxycodone-acetaminophen) .Marland Kitchen.. 1 tab by mouth q 4 hours as needed pain  Other Orders: Vit B12 1000 mcg ([id number])  Patient Instructions: 1)  Please schedule a follow-up appointment in 1 month.    Medication Administration  Injection # 1:    Medication: Vit B12 1000 mcg    Diagnosis: ANEMIA, 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)    Route:  IM    Site: R deltoid    Exp Date: 01/30/2012    Lot #: [id number]    Mfr: [employer]    Patient tolerated injection without complications    Given by: Starleen Blue RN (May 27, 2010 11:03 AM)  Medication # 1:    Medication: ASA 325mg  tab    Diagnosis: ANEMIA, 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)    Dose: 1000 mg  Orders Added: 1)  [facility] 2)  Vit B12 1000 mcg [[id number]]

## 2010-11-30 NOTE — Letter (Signed)
Summary: *Referral Letter  [facility]  [address]   [place], [place] [address]   Phone: [phone]  Fax: [phone]    04/15/2010  Thank you in advance for agreeing to see my patient:  [name] [address] [place], [place]  [address]  Phone: [phone]  Info for your follow-up care. Her back pain became worse after the last epidural steroid injection. Thoraco-lumbar x-rays were negative for compression fracture. Baclofen caused hallucinations. Oxycodone caused confusion when increased to 20 mg every 12 hr so decreased today to 10 mg every 12 hr. She is able to ambulate to the dining hall with a walker.  Current Medical Problems: 1)  DRUG-INDUCED DELIRIUM (ICD-292.81) 2)  LOW BACK PAIN SYNDROME, SEVERE (ICD-724.2) 3)  OTHER URINARY INCONTINENCE (ICD-788.39) 4)  MUSCLE SPASM, LUMBAR REGION (ICD-724.8) 5)  DYSURIA (ICD-788.1) 6)  SCREENING EXAMINATION FOR PULMONARY TUBERCULOSIS (ICD-V74.1) 7)  FOOT PAIN (ICD-729.5) 8)  TREMOR (ICD-781.0) 9)  SYNCOPE, HX OF (ICD-V12.49) 10)  HYPERTENSION, BENIGN SYSTEMIC (ICD-401.1) 11)  CHF, EJECTION FRACTION > OR = 50% (ICD-428.32) 12)  CORONARY, ARTERIOSCLEROSIS (ICD-414.00) 13)  VITAMIN D DEFICIENCY (ICD-268.9) 14)  PARKINSON'S DISEASE (ICD-332.0) 15)  ANEMIA, 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) 16)  RENAL DISEASE, CHRONIC, STAGE III (ICD-585.3) 17)  FIBROMYALGIA (ICD-729.1) 18)  SPINAL STENOSIS OF LUMBAR REGION (ICD-724.02) 19)  SYMPTOM, INCONTINENCE, URGE (ICD-788.31) 20)  HALLUX VALGUS, ACQUIRED (ICD-735.0) 21)  VULVITIS (ICD-616.10) 22)  NEUROPATHY, IDIOPATHIC PERIPHERAL NOS (ICD-356.9) 23)  PERNICIOUS ANEMIA (ICD-281.0) 24)  OSTEOPOROSIS, UNSPECIFIED (ICD-733.00) 25)  OSTEOARTHRITIS OF SPINE, NOS (ICD-721.90) 26)  HYPOTHYROIDISM, UNSPECIFIED (ICD-244.9) 27)  HYPERLIPIDEMIA (ICD-272.4) 28)  GOITER NOS (ICD-240.9) 29)  GASTROESOPHAGEAL REFLUX, NO ESOPHAGITIS (ICD-530.81) 30)   GAIT, ABNORMAL (ICD-781.2) 31)  DIVERTICULOSIS OF COLON (ICD-562.10) 32)  COPD (ICD-496) 33)  BURSITIS, SUBDELTOID (ICD-726.19) 34)  ATRIAL FIBRILLATION, PAROXYSMAL, HX OF (ICD-V12.50) 35)  ANXIETY (ICD-300.00)   Current Medications: 1)  LEVOTHROID 75 MCG TABS (LEVOTHYROXINE SODIUM) Take 1 tablet by mouth once a day at bedtime 2)  BONIVA 3 MG/3ML  KIT (IBANDRONATE SODIUM) Inject every 3 months 3)  FUROSEMIDE 40 MG  TABS (FUROSEMIDE) Take one in A.M. 4)  ISOSORBIDE MONONITRATE CR 30 MG XR24H-TAB (ISOSORBIDE MONONITRATE) Take one tablet daily 5)  PLAVIX 75 MG TABS (CLOPIDOGREL BISULFATE) Take 1 tablet by mouth once a day 6)  POTASSIUM CHLORIDE CRYS CR 20 MEQ TBCR (POTASSIUM CHLORIDE CRYS CR) Take 1 tablet by mouth once a day 7)  SIMVASTATIN 20 MG TABS (SIMVASTATIN) Take 1 tablet by mouth every night 8)  SM CALCIUM/VITAMIN D 500-200 MG-UNIT TABS (CALCIUM CARBONATE-VITAMIN D) Take 1 tablet by mouth twice a day 9)  ZYRTEC ALLERGY 10 MG TABS (CETIRIZINE HCL) 1 tablet by mouth once a day 10)  NITROGLYCERIN 0.4 MG SUBL (NITROGLYCERIN) Take 1 tab SL as needed chest pain 11)  VENTOLIN HFA 108 (90 BASE) MCG/ACT AERS (ALBUTEROL SULFATE) Take 2 puffs 4 times daily as needed 12)  FOLIC ACID 1 MG  TABS (FOLIC ACID) one dialy 13)  TOPROL XL 25 MG XR24H-TAB (METOPROLOL SUCCINATE) Take 2 tabs by mouth daily 14)  PREMARIN 0.625 MG/GM CREA (ESTROGENS, CONJUGATED) Apply daily vaginally 15)  FERROUS SULFATE 324 MG TBEC (FERROUS SULFATE) Take one tablet daily 16)  * BIOFREEZE TOPICAL Apply three times a day 17)  ANUSOL-HC 2.5 % CREA (HYDROCORTISONE) Apply two times a day 18)  BABY ASPIRIN 81 MG CHEW (ASPIRIN) Take one tablet daily 19)  VITAMIN D 1000 UNIT TABS (CHOLECALCIFEROL)  Take one tablet daily 20)  NEXIUM 40 MG CPDR (ESOMEPRAZOLE MAGNESIUM) one daily 21)  OXYCODONE-ACETAMINOPHEN 5-325 MG TABS (OXYCODONE-ACETAMINOPHEN) 1 tab by mouth q 4 hours as needed pain 22)  OXYCONTIN 10 MG XR12H-TAB (OXYCODONE HCL)  Take 1 tab q 12 hours   Past Medical History: 1)  anisocoria, 2)  DUB and labial irrit evaluated by Dr Audie Box 3)   hallux valgus, 4)   RLL lung lesion on chest CT 1999 5)  venous L  leg DVT 7/00 6)  thyroid hemorrhage 1/01 7)  LS spine and hips L 4-5 space narrowing - 03/02/2000 - sees Dr. August Saucer (recent injection in 9/09 by ?) 8)  CT - neck Goiter - 03/02/2000 9)  colonoscopy diffuse diverticulosis & hem - 01/02/2003 10)  Barium Swallow - 06/21/2001 Cardiac Cath - no change - 06/14/2006, Cardiac Cath 8/98 -, Cardiac Cath LAD stent open, RCA stent 80% open - 03/14/2005, Cardiac Cath-stents patent - 09/12/2005, Cardiolite Neg 6/00 -, Cardiolite-inferior wall thinning, EF 89% - 07/01/2004 11)  CT - Abdominal 6 mm RL lung lesion - 12/10/2002 12)  CT - Chest RL lesion unchanged - 08/30/2004 13)  Vit B12 - 12/30/2002, vit B12 normal 1 year without injection - 05/28/2004 14)   MRI - cerebellar atrophy - 07/17/2002 15)  CT - Chest RL lesion unchanged - 06/06/2006 16)  EGD - mild gastritis - 10/17/2006, EGD 7/99, repeat  gastric polyp - 12/01/2001, EGD benign gastric polyp - 12/29/2001 17)  TSH nl - 01/29/2006,  18)  Adenosine myoview low risk - 01/29/2006  Thank you again for agreeing to see our patient; please contact us if you have any further questions or need additional information.  Sincerely,  Zachery Dauer MD  Appended Document: *Referral Letter Faxed to Dr. Burnard Bunting

## 2010-11-30 NOTE — Consult Note (Signed)
Summary: Audio Eval mild/mod hearing loss  Audio Eval   Imported By: De Nurse 09/08/2010 14:47:36  _____________________________________________________________________  External Attachment:    Type:   Image     Comment:   External Document  Appended Document: Audio Eval mild/mod hearing loss    Clinical Lists Changes  Problems: Changed problem from UNSPECIFIED HEARING LOSS (ICD-389.9) to HEARING LOSS, SENSORINEURAL, BILATERAL (ICD-389.10) Removed problem of SCREENING EXAMINATION FOR PULMONARY TUBERCULOSIS (ICD-V74.1) Removed problem of VITAMIN D DEFICIENCY (ICD-268.9) Removed problem of SYNCOPE, HX OF (ICD-V12.49) Removed problem of LOSS OF WEIGHT (ICD-783.21)

## 2010-11-30 NOTE — Letter (Signed)
Summary: *Referral Letter  [facility]  [address]   [place], [place] [address]   Phone: [phone]  Fax: [phone]    12/17/2009  [name] [address] [place], [place]  [address]  Phone: [phone] Dr Ty Hilts:  [name]'s direct LDL was 51 on 10/06/09.  Will try to get a fasting lipid panel before you next see her.   Current Medical Problems: 1)  SYNCOPE, HX OF (ICD-V12.49) 2)  HYPERTENSION, BENIGN SYSTEMIC (ICD-401.1) 3)  CHF, EJECTION FRACTION > OR = 50% (ICD-428.32) 4)  CORONARY, ARTERIOSCLEROSIS (ICD-414.00) 5)  VITAMIN D DEFICIENCY (ICD-268.9) 6)  PARKINSON'S DISEASE (ICD-332.0) 7)  ANEMIA, 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) 8)  RENAL DISEASE, CHRONIC, STAGE III (ICD-585.3) 9)  FIBROMYALGIA (ICD-729.1) 10)  SPINAL STENOSIS OF LUMBAR REGION (ICD-724.02) 11)  SYMPTOM, INCONTINENCE, URGE (ICD-788.31) 12)  HALLUX VALGUS, ACQUIRED (ICD-735.0) 13)  VULVITIS (ICD-616.10) 14)  NEUROPATHY, IDIOPATHIC PERIPHERAL NOS (ICD-356.9) 15)  PERNICIOUS ANEMIA (ICD-281.0) 16)  OSTEOPOROSIS, UNSPECIFIED (ICD-733.00) 17)  OSTEOARTHRITIS OF SPINE, NOS (ICD-721.90) 18)  HYPOTHYROIDISM, UNSPECIFIED (ICD-244.9) 19)  HYPERLIPIDEMIA (ICD-272.4) 20)  GOITER NOS (ICD-240.9) 21)  GASTROESOPHAGEAL REFLUX, NO ESOPHAGITIS (ICD-530.81) 22)  GAIT, ABNORMAL (ICD-781.2) 23)  DIVERTICULOSIS OF COLON (ICD-562.10) 24)  COPD (ICD-496) 25)  BURSITIS, SUBDELTOID (ICD-726.19) 26)  ATRIAL FIBRILLATION, PAROXYSMAL, HX OF (ICD-V12.50) 27)  ANXIETY (ICD-300.00)   Current Medications: 1)  LEVOTHROID 75 MCG TABS (LEVOTHYROXINE SODIUM) Take 1 tablet by mouth once a day at bedtime 2)  BONIVA 3 MG/3ML  KIT (IBANDRONATE SODIUM) Inject every 3 months 3)  FUROSEMIDE 40 MG  TABS (FUROSEMIDE) Take one in A.M. 4)  HYDROCODONE-ACETAMINOPHEN 5-500 MG TABS (HYDROCODONE-ACETAMINOPHEN) Take 1 tablet by mouth every four hours 5)  ISOSORBIDE MONONITRATE CR 30 MG XR24H-TAB  (ISOSORBIDE MONONITRATE) Take one tablet daily 6)  PLAVIX 75 MG TABS (CLOPIDOGREL BISULFATE) Take 1 tablet by mouth once a day 7)  POTASSIUM CHLORIDE CRYS CR 20 MEQ TBCR (POTASSIUM CHLORIDE CRYS CR) Take 1 tablet by mouth once a day 8)  SIMVASTATIN 20 MG TABS (SIMVASTATIN) Take 1 tablet by mouth every night 9)  SM CALCIUM/VITAMIN D 500-200 MG-UNIT TABS (CALCIUM CARBONATE-VITAMIN D) Take 1 tablet by mouth twice a day 10)  ZYRTEC ALLERGY 10 MG TABS (CETIRIZINE HCL) 1 tablet by mouth once a day 11)  NITROGLYCERIN 0.4 MG SUBL (NITROGLYCERIN) Take 1 tab SL as needed chest pain 12)  VENTOLIN HFA 108 (90 BASE) MCG/ACT AERS (ALBUTEROL SULFATE) Take 2 puffs 4 times daily as needed 13)  FOLIC ACID 1 MG  TABS (FOLIC ACID) one dialy 14)  TOPROL XL 25 MG XR24H-TAB (METOPROLOL SUCCINATE) Take 1 and 1/2 tab daily 15)  PREMARIN 0.625 MG/GM CREA (ESTROGENS, CONJUGATED) Apply daily vaginally 16)  FERROUS SULFATE 324 MG TBEC (FERROUS SULFATE) Take one tablet daily 17)  * BIOFREEZE TOPICAL Apply three times a day 18)  ANUSOL-HC 2.5 % CREA (HYDROCORTISONE) Apply two times a day 19)  BABY ASPIRIN 81 MG CHEW (ASPIRIN) Take one tablet daily 20)  VITAMIN D 1000 UNIT TABS (CHOLECALCIFEROL) Take one tablet daily 21)  NEXIUM 40 MG CPDR (ESOMEPRAZOLE MAGNESIUM) one daily 22)  CLOTRIMAZOLE-BETAMETHASONE 1-0.05 % CREA (CLOTRIMAZOLE-BETAMETHASONE) apply to bottom two times a day for 10 days, 30 GM tube 23)  ROPINIROLE HCL 0.25 MG TABS (ROPINIROLE HCL) Take 1 tab three times a day for 1 week.Then take 2 tabs three times a day   Past Medical History: 1)  anisocoria, 2)  DUB and labial irrit evaluated by  Dr Audie Box 3)   hallux valgus, 4)   RLL lung lesion on chest CT 1999 5)  venous L  leg DVT 7/00 6)  thyroid hemorrhage 1/01 7)  LS spine and hips L 4-5 space narrowing - 03/02/2000 - sees Dr. August Saucer (recent injection in 9/09 by ?) 8)  CT - neck Goiter - 03/02/2000 9)  colonoscopy diffuse diverticulosis & hem - 01/02/2003 10)   Barium Swallow - 06/21/2001 Cardiac Cath - no change - 06/14/2006, Cardiac Cath 8/98 -, Cardiac Cath LAD stent open, RCA stent 80% open - 03/14/2005, Cardiac Cath-stents patent - 09/12/2005, Cardiolite Neg 6/00 -, Cardiolite-inferior wall thinning, EF 89% - 07/01/2004 11)  CT - Abdominal 6 mm RL lung lesion - 12/10/2002 12)  CT - Chest RL lesion unchanged - 08/30/2004 13)  Vit B12 - 12/30/2002, vit B12 normal 1 year without injection - 05/28/2004 14)   MRI - cerebellar atrophy - 07/17/2002 15)  CT - Chest RL lesion unchanged - 06/06/2006 16)  EGD - mild gastritis - 10/17/2006, EGD 7/99, repeat  gastric polyp - 12/01/2001, EGD benign gastric polyp - 12/29/2001 17)  TSH nl - 01/29/2006,  18)  Adenosine myoview low risk - 01/29/2006   Sincerely,  Zachery Dauer MD  Appended Document: *Referral Letter faxed to Dr. Amil Amen

## 2010-11-30 NOTE — Progress Notes (Signed)
Summary: phn msg Can't tolerate Sinemet  Phone Note Call from Patient Call back at Home Phone [phone]   Caller: Patient Summary of Call: Pt calling to let Dr. Sheffield Slider know how she is doing on medication he started her on last week. She has stopped medication says she can not take it. Initial call taken by: Clydell Hakim,  January 22, 2010 9:48 AM  Follow-up for Phone Call        Can't tolerate Sinemet due to abdominal discomfort and diarrhea. Would like to try another medication so will send in Amantidine. Follow-up by: Zachery Dauer MD,  January 22, 2010 11:32 AM    New/Updated Medications: AMANTADINE HCL 100 MG TABS (AMANTADINE HCL) Take one tab every AM Prescriptions: AMANTADINE HCL 100 MG TABS (AMANTADINE HCL) Take one tab every AM  #14 x 0   Entered and Authorized by:   Zachery Dauer MD   Signed by:   Zachery Dauer MD on 01/22/2010   Method used:   Electronically to        [facility] [address] [id number] (retail)       [address]       [place], [place]  [address]       Ph: [phone]       Fax: [phone]   RxID:   [id number]

## 2010-11-30 NOTE — Assessment & Plan Note (Signed)
Summary: FU/KH   Vital Signs:  Patient profile:   77 year old female Height:      62.5 inches Weight:      123.5 pounds Pulse rate:   92 / minute BP sitting:   140 / 72  (right arm)  Vitals Entered By: Arlyss Repress CMA, (January 14, 2010 10:50 AM) CC: regular ov.  Is Patient Diabetic? No Pain Assessment Patient in pain? no        Primary Care Provider:  Zachery Dauer MD  CC:  regular ov. .  History of Present Illness: 77 yo female 1. Unable to tolerate ropinirole despite multiple trials.  Complains of nausea and decreased appetite with med, stopped taking.  waxing and wanningt discomfort in her legs, mainly cramps and burn.  Tremor worsening, feels like it is progressing thru her entire body, not isolated to arms and legs anymore.  2. Saddness: multifactorial.    3. Living situation: continues to live with son and daughter-in-law.  Have not asked her for more money, but she does pay them $150.  Pt desires assisted living home due to loneliness and perceived burden on family who provides care for her.  Family does not want pt to go to ALF.   Habits & Providers  Alcohol-Tobacco-Diet     Tobacco Status: never  Current Medications (verified): 1)  Levothroid 75 Mcg Tabs (Levothyroxine Sodium) .... Take 1 Tablet By Mouth Once A Day At Bedtime 2)  Boniva 3 Mg/18ml  Kit (Ibandronate Sodium) .... Inject Every 3 Months 3)  Furosemide 40 Mg  Tabs (Furosemide) .... Take One in A.m. 4)  Hydrocodone-Acetaminophen 5-500 Mg Tabs (Hydrocodone-Acetaminophen) .... Take 1 Tablet By Mouth Every Four Hours 5)  Isosorbide Mononitrate Cr 30 Mg Xr24h-Tab (Isosorbide Mononitrate) .... Take One Tablet Daily 6)  Plavix 75 Mg Tabs (Clopidogrel Bisulfate) .... Take 1 Tablet By Mouth Once A Day 7)  Potassium Chloride Crys Cr 20 Meq Tbcr (Potassium Chloride Crys Cr) .... Take 1 Tablet By Mouth Once A Day 8)  Simvastatin 20 Mg Tabs (Simvastatin) .... Take 1 Tablet By Mouth Every Night 9)  Sm Calcium/vitamin D  500-200 Mg-Unit Tabs (Calcium Carbonate-Vitamin D) .... Take 1 Tablet By Mouth Twice A Day 10)  Zyrtec Allergy 10 Mg Tabs (Cetirizine Hcl) .Marland Kitchen.. 1 Tablet By Mouth Once A Day 11)  Nitroglycerin 0.4 Mg Subl (Nitroglycerin) .... Take 1 Tab Sl As Needed Chest Pain 12)  Ventolin Hfa 108 (90 Base) Mcg/act Aers (Albuterol Sulfate) .... Take 2 Puffs 4 Times Daily As Needed 13)  Folic Acid 1 Mg  Tabs (Folic Acid) .... One Dialy 14)  Toprol Xl 25 Mg Xr24h-Tab (Metoprolol Succinate) .... Take 1 and 1/2 Tab Daily 15)  Premarin 0.625 Mg/gm Crea (Estrogens, Conjugated) .... Apply Daily Vaginally 16)  Ferrous Sulfate 324 Mg Tbec (Ferrous Sulfate) .... Take One Tablet Daily 17)  Biofreeze Topical .... Apply Three Times A Day 18)  Anusol-Hc 2.5 % Crea (Hydrocortisone) .... Apply Two Times A Day 19)  Baby Aspirin 81 Mg Chew (Aspirin) .... Take One Tablet Daily 20)  Vitamin D 1000 Unit Tabs (Cholecalciferol) .... Take One Tablet Daily 21)  Nexium 40 Mg Cpdr (Esomeprazole Magnesium) .... One Daily 22)  Clotrimazole-Betamethasone 1-0.05 % Crea (Clotrimazole-Betamethasone) .... Apply To Bottom Two Times A Day For 10 Days, 30 Gm Tube 23)  Sinemet 25-100 Mg Tabs (Carbidopa-Levodopa) .... Take 1/2 Pill Three Times A Day  Allergies (verified): 1)  ! * Sinemet 2)  Bactrim Ds (Sulfamethoxazole-Trimethoprim) 3)  Gabapentin (Gabapentin) 4)  Codeine 5)  Effexor Xr (Venlafaxine Hcl) 6)  Cymbalta (Duloxetine Hcl) 7)  Lipitor (Atorvastatin Calcium)  Social History: Smoking Status:  never  Physical Exam  Extremities:  1+ left pedal edema and trace right pedal edema.   Neurologic:  alert, cranial nerves II-XII intact.  Rest tremor and postural tremor left arm > R without cogwheeling.  Tremor worse will walking. Stance only mildly widened. Short stable steps. Discontinuous turns. Romberg stable.  Skin:  Senile purpura on arms Psych:  memory intact for recent and remote, normally interactive, and good eye contact.   moderately anxious.  Able to laugh   Impression & Recommendations:  Problem # 1:  PARKINSON'S DISEASE (ICD-332.0)  She wishes to try another medication so will start Sinemet. If side effects she can try breaking the pill in half.   Orders: [facility]- Est Level  3 (16109)  Complete Medication List: 1)  Levothroid 75 Mcg Tabs (Levothyroxine sodium) .... Take 1 tablet by mouth once a day at bedtime 2)  Boniva 3 Mg/59ml Kit (Ibandronate sodium) .... Inject every 3 months 3)  Furosemide 40 Mg Tabs (Furosemide) .... Take one in a.m. 4)  Hydrocodone-acetaminophen 5-500 Mg Tabs (Hydrocodone-acetaminophen) .... Take 1 tablet by mouth every four hours 5)  Isosorbide Mononitrate Cr 30 Mg Xr24h-tab (Isosorbide mononitrate) .... Take one tablet daily 6)  Plavix 75 Mg Tabs (Clopidogrel bisulfate) .... Take 1 tablet by mouth once a day 7)  Potassium Chloride Crys Cr 20 Meq Tbcr (Potassium chloride crys cr) .... Take 1 tablet by mouth once a day 8)  Simvastatin 20 Mg Tabs (Simvastatin) .... Take 1 tablet by mouth every night 9)  Sm Calcium/vitamin D 500-200 Mg-unit Tabs (Calcium carbonate-vitamin d) .... Take 1 tablet by mouth twice a day 10)  Zyrtec Allergy 10 Mg Tabs (Cetirizine hcl) .Marland Kitchen.. 1 tablet by mouth once a day 11)  Nitroglycerin 0.4 Mg Subl (Nitroglycerin) .... Take 1 tab sl as needed chest pain 12)  Ventolin Hfa 108 (90 Base) Mcg/act Aers (Albuterol sulfate) .... Take 2 puffs 4 times daily as needed 13)  Folic Acid 1 Mg Tabs (Folic acid) .... One dialy 14)  Toprol Xl 25 Mg Xr24h-tab (Metoprolol succinate) .... Take 1 and 1/2 tab daily 15)  Premarin 0.625 Mg/gm Crea (Estrogens, conjugated) .... Apply daily vaginally 16)  Ferrous Sulfate 324 Mg Tbec (Ferrous sulfate) .... Take one tablet daily 17)  Biofreeze Topical  .... Apply three times a day 18)  Anusol-hc 2.5 % Crea (Hydrocortisone) .... Apply two times a day 19)  Baby Aspirin 81 Mg Chew (Aspirin) .... Take one tablet daily 20)  Vitamin D 1000  Unit Tabs (Cholecalciferol) .... Take one tablet daily 21)  Nexium 40 Mg Cpdr (Esomeprazole magnesium) .... One daily 22)  Clotrimazole-betamethasone 1-0.05 % Crea (Clotrimazole-betamethasone) .... Apply to bottom two times a day for 10 days, 30 gm tube 23)  Sinemet 25-100 Mg Tabs (Carbidopa-levodopa) .... Take 1/2 pill three times a day  Other Orders: Vit B12 1000 mcg (U0454)  Patient Instructions: 1)  Stop Ropinirole. 2)  Start Sinemet 1 pill twice a day.  One in the morning and once at bedtime.  3)  Call Dr. Sheffield Slider next week and let him know how you are doing on the medicine.  Prescriptions: SINEMET 25-100 MG TABS (CARBIDOPA-LEVODOPA) take 1/2 pill three times a day  #90 x 0   Entered by:   Alvia Grove DO   Authorized by:   Zachery Dauer MD  Signed by:   Alvia Grove DO on 01/14/2010   Method used:   Electronically to        [facility]  [address] [id number] (retail)       [address]       [place], [place]  [address]       Ph: [phone]       Fax: [phone]   RxID:   [id number]    Prevention & Chronic Care Immunizations   Influenza vaccine: Fluvax MCR  (08/27/2009)   Influenza vaccine due: 07/21/2009    Tetanus booster: 12/29/2004: Done.   Tetanus booster due: 12/30/2014    Pneumococcal vaccine: Done.  (09/30/1992)   Pneumococcal vaccine deferral: Not indicated  (06/25/2009)   Pneumococcal vaccine due: None    H. zoster vaccine: 02/19/2008: given  Colorectal Screening   Hemoccult: Done.  (12/30/2002)   Hemoccult due: Not Indicated    Colonoscopy: Done.  (08/31/2006)   Colonoscopy due: 08/31/2016  Other Screening   Pap smear: Done.  (07/31/1993)   Pap smear due: Not Indicated    Mammogram: normal  (07/01/2008)   Mammogram due: 07/01/2009    DXA bone density scan: Done.  (05/31/2006)   DXA bone density action/deferral: Not indicated  (06/25/2009)   DXA scan due: None    Smoking status: never  (01/14/2010)  Lipids   Total  Cholesterol: 135  (06/20/2008)   LDL: 50  (06/20/2008)   LDL Direct: 51  (10/06/2009)   HDL: 55  (06/20/2008)   Triglycerides: 149  (06/20/2008)    SGOT (AST): 20  (10/06/2009)   SGPT (ALT): 9  (10/06/2009)   Alkaline phosphatase: 68  (10/06/2009)   Total bilirubin: 0.4  (10/06/2009)    Lipid flowsheet reviewed?: Yes   Progress toward LDL goal: At goal  Hypertension   Last Blood Pressure: 140 / 72  (01/14/2010)   Serum creatinine: 1.11  (10/06/2009)   Serum potassium 4.4  (10/06/2009)    Hypertension flowsheet reviewed?: Yes   Progress toward BP goal: At goal  Self-Management Support :   Personal Goals (by the next clinic visit) :      Personal blood pressure goal: 140/90  (06/25/2009)     Personal LDL goal: 70  (06/25/2009)    Hypertension self-management support: Not documented    Lipid self-management support: Not documented      Geriatric Assessment:  Activities of Daily Living:    Bathing-independent    Dressing-independent    Eating-independent    Toileting-independent    Transferring-independent    Continence-independent  Instrumental Activities of Daily Living:    Transportation-dependent    Meal/Food Preparation-assisted    Shopping Errands-dependent    Housekeeping/Chores-assisted    Money Management/Finances-independent    Medication Management-independent    Ability to Use Telephone-independent    Laundry-dependent Overall Assessment: partially dependent  Mental Status Exam: (value/max value)    Orientation to Time: 5/5    Orientation to Place: 5/5    Registration: 3/3    Attention/Calculation: 5/5    Recall: 3/3    Language-name 2 objects: 2/2    Language-repeat: 1/1    Language-follow 3-step command: 3/3    Language-read and follow direction: 1/1    Write a sentence: 1/1    Copy design: 0/1 MSE Total score: 29/30     Medication Administration  Injection # 1:    Medication: Vit B12 1000 mcg    Diagnosis: ANEMIA, SECONDARY TO  CHRONIC  BLOOD LOSS (ICD-280.0)    Route: IM    Site: L deltoid    Exp Date: 09/01/2011    Lot #: [id number]    Mfr: [employer]    Patient tolerated injection without complications    Given by: robert busick, cma  Orders Added: 1)  Vit B12 1000 mcg [J3420] 2)  [facility]- Est Level  3 [78469]

## 2010-11-30 NOTE — Assessment & Plan Note (Signed)
Summary: f/up,tcb   Vital Signs:  Patient profile:   77 year old female Height:      62.5 inches Weight:      126 pounds Pulse rate:   82 / minute Pulse (ortho):   88 / minute Pulse rhythm:   regular BP supine:   144 / 64  (left arm) BP sitting:   155 / 82  (right arm) BP standing:   140 / 72 Cuff size:   regular  Vitals Entered By: Tessie Fass CMA (February 04, 2010 8:55 AM) CC: F/U Is Patient Diabetic? No Pain Assessment Patient in pain? no        Primary Care Provider:  Zachery Dauer MD  CC:  F/U.  History of Present Illness: 77 yo female here for f/u on her tremor 1.  Tremor- Pt states she attempted to take sinemet but was unable to tolerate the medication due to n/v and one episode of vomiting.  Pt states that the tremor seems to be a little worse recently.  No other change in medication or environment.  Denies any caffiene or other stimulant use.  Pt would still like to do something for the tremor but would not like to add a pill if possible.  Pt denies the tremor getting worse with activity but has notice it is now on her left side when it used to be only her right. Still able to do all activities of daily activity.    2.  Living situation Pt is living with son and daughter in law.  Good relationship but pt feels she is not getting enough social interaction and that it may be detremental to her health.  Pt would like to look into the option of assisted living facilities and would like to still near her family who lives in [place].  The other option would be to live in a facility that Dr. Sheffield Slider would visited would be nice.  Pt is able to do all ADL'S on her own still.  Has not thought of day care facility.  3.  Sadness- Pt states likely due to #2   4.  Pt is due for her B12 shot  5.  Pt would like to know if she can get her         Current Medications (verified): 1)  Levothroid 75 Mcg Tabs (Levothyroxine Sodium) .... Take 1 Tablet By Mouth Once A Day At  Bedtime 2)  Boniva 3 Mg/57ml  Kit (Ibandronate Sodium) .... Inject Every 3 Months 3)  Furosemide 40 Mg  Tabs (Furosemide) .... Take One in A.m. 4)  Hydrocodone-Acetaminophen 5-500 Mg Tabs (Hydrocodone-Acetaminophen) .... Take 1 Tablet By Mouth Every Four Hours 5)  Isosorbide Mononitrate Cr 30 Mg Xr24h-Tab (Isosorbide Mononitrate) .... Take One Tablet Daily 6)  Plavix 75 Mg Tabs (Clopidogrel Bisulfate) .... Take 1 Tablet By Mouth Once A Day 7)  Potassium Chloride Crys Cr 20 Meq Tbcr (Potassium Chloride Crys Cr) .... Take 1 Tablet By Mouth Once A Day 8)  Simvastatin 20 Mg Tabs (Simvastatin) .... Take 1 Tablet By Mouth Every Night 9)  Sm Calcium/vitamin D 500-200 Mg-Unit Tabs (Calcium Carbonate-Vitamin D) .... Take 1 Tablet By Mouth Twice A Day 10)  Zyrtec Allergy 10 Mg Tabs (Cetirizine Hcl) .Marland Kitchen.. 1 Tablet By Mouth Once A Day 11)  Nitroglycerin 0.4 Mg Subl (Nitroglycerin) .... Take 1 Tab Sl As Needed Chest Pain 12)  Ventolin Hfa 108 (90 Base) Mcg/act Aers (Albuterol Sulfate) .... Take 2 Puffs 4 Times  Daily As Needed 13)  Folic Acid 1 Mg  Tabs (Folic Acid) .... One Dialy 14)  Toprol Xl 25 Mg Xr24h-Tab (Metoprolol Succinate) .... Take 2 Tabs By Mouth Daily 15)  Premarin 0.625 Mg/gm Crea (Estrogens, Conjugated) .... Apply Daily Vaginally 16)  Ferrous Sulfate 324 Mg Tbec (Ferrous Sulfate) .... Take One Tablet Daily 17)  Biofreeze Topical .... Apply Three Times A Day 18)  Anusol-Hc 2.5 % Crea (Hydrocortisone) .... Apply Two Times A Day 19)  Baby Aspirin 81 Mg Chew (Aspirin) .... Take One Tablet Daily 20)  Vitamin D 1000 Unit Tabs (Cholecalciferol) .... Take One Tablet Daily 21)  Nexium 40 Mg Cpdr (Esomeprazole Magnesium) .... One Daily 22)  Clotrimazole-Betamethasone 1-0.05 % Crea (Clotrimazole-Betamethasone) .... Apply To Bottom Two Times A Day For 10 Days, 30 Gm Tube 23)  Amantadine Hcl 100 Mg Tabs (Amantadine Hcl) .... Take One Tab Every Am  Allergies (verified): 1)  ! * Sinemet 2)  Bactrim Ds  (Sulfamethoxazole-Trimethoprim) 3)  Gabapentin (Gabapentin) 4)  Codeine 5)  Effexor Xr (Venlafaxine Hcl) 6)  Cymbalta (Duloxetine Hcl) 7)  Lipitor (Atorvastatin Calcium)  Past History:  Past medical, surgical, family and social histories (including risk factors) reviewed, and no changes noted (except as noted below).  Past Medical History: Reviewed history from 08/21/2008 and no changes required. 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 August Saucer (recent injection in 9/09 by ?) CT - neck Goiter - 03/02/2000 colonoscopy diffuse diverticulosis & hem - 01/02/2003 Barium Swallow - 06/21/2001 Cardiac Cath - no change - 06/14/2006, Cardiac Cath 8/98 -, Cardiac Cath LAD stent open, RCA stent 80% open - 03/14/2005, Cardiac Cath-stents patent - 09/12/2005, Cardiolite Neg 6/00 -, Cardiolite-inferior wall thinning, EF 89% - 07/01/2004 CT - Abdominal 6 mm RL lung lesion - 12/10/2002 CT - Chest RL lesion unchanged - 08/30/2004 Vit B12 - 12/30/2002, vit B12 normal 1 year without injection - 05/28/2004  MRI - cerebellar atrophy - 07/17/2002 CT - Chest RL lesion unchanged - 06/06/2006 EGD - mild gastritis - 10/17/2006, EGD 7/99, repeat  gastric polyp - 12/01/2001, EGD benign gastric polyp - 12/29/2001 TSH nl - 01/29/2006,  Adenosine myoview low risk - 01/29/2006  Past Surgical History: Reviewed history from 03/20/2007 and no changes required. partial thyroidectomy 75 Angioplasty and stent 9/99  Appendectomy  Cervical laminectomy  Cholecystectomy   D&C 4/00 benign  coronary stent 8/00  L Rotator cuff repair ORIF R hip fracture - 03/16/2000  Splenectomy  Family History: Reviewed history from 06/25/2009 and no changes required.  Htn and heart disease M died age 63  Kidney disease F died age 10 Alcohol complications killed Bro at age 79 Breast  CA - daughter died age 24, Sister died age 65 Sister living - age 71, arthritis CAD, DM - son, Dorinda Hill, in [place] CVA - [place], [place], [place] mild PVD - Windy Fast, in [place]  Social History: Reviewed history from 08/21/2008 and no changes required. widowed 1976 Lives with son's family Sons Dorinda Hill and Windy Fast in [place]; Son Chrissie Noa in [place] non-smoker, but uses 1/3 tin of smokeless tobacco daily non-drinker; [employer] faith  Doing some exercises (those given to her after hip fx) daily  Review of Systems       denies fever, chills, nausea, vomiting, diarrhea or constipation   Physical Exam  General:  Look well, alert.  Moved well onto exam table.  Properly dressed.  At rest pt did have resting tremor both hands right greater than left.  Some tremor in mandible as well at rest. Head:  normocephalic and atraumatic, no visible bruising  Eyes:  vision grossly intact, pupils equal, pupils round, and pupils reactive to light.   Neck:  no JVD Lungs:  CTAB, no wheezes, rales rhoncii Heart:  Pt after walking did have 1-2 minute stent of a fib but spontaneously converted to  regular rhythm and Grade  1/6 systolic ejection murmur.   Abdomen:  + tenderness to palpation in LLQ, +BS,  Msk:  gait- pt used can on left side had trendelnberg gait somewhat favoring right side  Extremities:  mild LE edema, + 1 dp pulses,  Neurologic:  negative rhomberg, no pronator drift   Impression & Recommendations:  Problem # 1:  TREMOR (ICD-781.0) Assessment Unchanged  will increase metoprolol to 50mg  once daily.  Pt hr was 82 today.    Orders: [facility]- Est  Level 4 (21308)  Problem # 2:  HYPERTENSION, BENIGN SYSTEMIC (ICD-401.1) Assessment: Unchanged  increase toprol XL.  Reasses in  2 weeks Her updated medication list for this problem includes:    Furosemide 40 Mg Tabs (Furosemide) .Marland Kitchen... Take one in a.m.    Toprol Xl 25 Mg Xr24h-tab (Metoprolol succinate) .Marland Kitchen... Take 2 tabs by mouth daily  Orders: [facility]- Est  Level 4  (99214)  Problem # 3:  NEUROPATHY, IDIOPATHIC PERIPHERAL NOS (ICD-356.9) Assessment: Unchanged B 12 shot today, gait doing well.  Problem # 4:  Living situation Pt given list of ALF for them to call and decide.  Also given resources for day care facilities.  Complete Medication List: 1)  Levothroid 75 Mcg Tabs (Levothyroxine sodium) .... Take 1 tablet by mouth once a day at bedtime 2)  Boniva 3 Mg/50ml Kit (Ibandronate sodium) .... Inject every 3 months 3)  Furosemide 40 Mg Tabs (Furosemide) .... Take one in a.m. 4)  Hydrocodone-acetaminophen 5-500 Mg Tabs (Hydrocodone-acetaminophen) .... Take 1 tablet by mouth every four hours 5)  Isosorbide Mononitrate Cr 30 Mg Xr24h-tab (Isosorbide mononitrate) .... Take one tablet daily 6)  Plavix 75 Mg Tabs (Clopidogrel bisulfate) .... Take 1 tablet by mouth once a day 7)  Potassium Chloride Crys Cr 20 Meq Tbcr (Potassium chloride crys cr) .... Take 1 tablet by mouth once a day 8)  Simvastatin 20 Mg Tabs (Simvastatin) .... Take 1 tablet by mouth every night 9)  Sm Calcium/vitamin D 500-200 Mg-unit Tabs (Calcium carbonate-vitamin d) .... Take 1 tablet by mouth twice a day 10)  Zyrtec Allergy 10 Mg Tabs (Cetirizine hcl) .Marland Kitchen.. 1 tablet by mouth once a day 11)  Nitroglycerin 0.4 Mg Subl (Nitroglycerin) .... Take 1 tab sl as needed chest pain 12)  Ventolin Hfa 108 (90 Base) Mcg/act Aers (Albuterol sulfate) .... Take 2 puffs 4 times daily as needed 13)  Folic Acid 1 Mg Tabs (Folic acid) .... One dialy 14)  Toprol Xl 25 Mg Xr24h-tab (Metoprolol succinate) .... Take 2 tabs by mouth daily 15)  Premarin 0.625 Mg/gm Crea (Estrogens, conjugated) .... Apply daily vaginally 16)  Ferrous Sulfate 324 Mg Tbec (Ferrous sulfate) .... Take one tablet daily 17)  Biofreeze Topical  .... Apply three times a day 18)  Anusol-hc 2.5 % Crea (Hydrocortisone) .... Apply two times a day 19)  Baby Aspirin 81 Mg Chew (Aspirin) .... Take one tablet daily 20)  Vitamin D 1000 Unit Tabs  (  Cholecalciferol) .... Take one tablet daily 21)  Nexium 40 Mg Cpdr (Esomeprazole magnesium) .... One daily 22)  Clotrimazole-betamethasone 1-0.05 % Crea (Clotrimazole-betamethasone) .... Apply to bottom two times a day for 10 days, 30 gm tube 23)  Amantadine Hcl 100 Mg Tabs (Amantadine hcl) .... Take one tab every am  Other Orders: Vit B12 1000 mcg (W1093)  Patient Instructions: 1)  Very nice to meet you 2)  We are going to change your toprol XL 25mg  taking 2 tab by mouth daily.  I think this may help your tremor.   3)  Look into assisted living facilities that Dr. Sheffield Slider marked as good options.  Remember this likely will be expensive.  4)  Please follow up with Dr. Clarene Duke in May as scheduled 5)  Please follow up with [place] in 2-4 weeks Prescriptions: TOPROL XL 25 MG XR24H-TAB (METOPROLOL SUCCINATE) Take 2 tabs by mouth daily  #64 x 10   Entered by:   Antoine Primas DO   Authorized by:   Zachery Dauer MD   Signed by:   Antoine Primas DO on 02/04/2010   Method used:   Electronically to        [facility]  [address] [id number] (retail)       [address]       [place], [place]  [address]       Ph: [phone]       Fax: [phone]   RxID:   [id number]    Medication Administration  Injection # 1:    Medication: Vit B12 1000 mcg    Diagnosis: ANEMIA, 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)    Route: IM    Site: L deltoid    Exp Date: 10/01/2011    Lot #: [id number]    Mfr: american reagent    Patient tolerated injection without complications    Given by: Jone Baseman CMA (February 04, 2010 9:54 AM)  Orders Added: 1)  Vit B12 1000 mcg [J3420] 2)  [facility]- Est  Level 4 [50093]   Appended Document: f/up tremor,tcb    Clinical Lists Changes  Observations: Added new observation of MAMMRECACT: Not indicated (02/05/2010 9:23) Added new observation of LIPID PROGRS: At goal (02/05/2010 9:23) Added new observation of LIPID FSREVW: Yes (02/05/2010 9:23) Added new  observation of HTN PROGRESS: Deteriorated (02/05/2010 9:23) Added new observation of HTN FSREVIEW: Yes (02/05/2010 9:23) Added new observation of DM PROGRESS: N/A (02/05/2010 9:23) Added new observation of DM FSREVIEW: N/A (02/05/2010 9:23) Added new observation of NKA: F (02/05/2010 9:23) Added new observation of ALLERGY REV: Done (02/05/2010 9:23) Added new observation of MEDRECON: current updated (02/05/2010 9:23)       Current Medications (verified): 1)  Levothroid 75 Mcg Tabs (Levothyroxine Sodium) .... Take 1 Tablet By Mouth Once A Day At Bedtime 2)  Boniva 3 Mg/71ml  Kit (Ibandronate Sodium) .... Inject Every 3 Months 3)  Furosemide 40 Mg  Tabs (Furosemide) .... Take One in A.m. 4)  Hydrocodone-Acetaminophen 5-500 Mg Tabs (Hydrocodone-Acetaminophen) .... Take 1 Tablet By Mouth Every Four Hours 5)  Isosorbide Mononitrate Cr 30 Mg Xr24h-Tab (Isosorbide Mononitrate) .... Take One Tablet Daily 6)  Plavix 75 Mg Tabs (Clopidogrel Bisulfate) .... Take 1 Tablet By Mouth Once A Day 7)  Potassium Chloride Crys Cr 20 Meq Tbcr (Potassium Chloride Crys Cr) .... Take 1 Tablet By Mouth Once A Day 8)  Simvastatin 20 Mg Tabs (Simvastatin) .... Take 1 Tablet By  Mouth Every Night 9)  Sm Calcium/vitamin D 500-200 Mg-Unit Tabs (Calcium Carbonate-Vitamin D) .... Take 1 Tablet By Mouth Twice A Day 10)  Zyrtec Allergy 10 Mg Tabs (Cetirizine Hcl) .Marland Kitchen.. 1 Tablet By Mouth Once A Day 11)  Nitroglycerin 0.4 Mg Subl (Nitroglycerin) .... Take 1 Tab Sl As Needed Chest Pain 12)  Ventolin Hfa 108 (90 Base) Mcg/act Aers (Albuterol Sulfate) .... Take 2 Puffs 4 Times Daily As Needed 13)  Folic Acid 1 Mg  Tabs (Folic Acid) .... One Dialy 14)  Toprol Xl 25 Mg Xr24h-Tab (Metoprolol Succinate) .... Take 2 Tabs By Mouth Daily 15)  Premarin 0.625 Mg/gm Crea (Estrogens, Conjugated) .... Apply Daily Vaginally 16)  Ferrous Sulfate 324 Mg Tbec (Ferrous Sulfate) .... Take One Tablet Daily 17)  Biofreeze Topical .... Apply Three  Times A Day 18)  Anusol-Hc 2.5 % Crea (Hydrocortisone) .... Apply Two Times A Day 19)  Baby Aspirin 81 Mg Chew (Aspirin) .... Take One Tablet Daily 20)  Vitamin D 1000 Unit Tabs (Cholecalciferol) .... Take One Tablet Daily 21)  Nexium 40 Mg Cpdr (Esomeprazole Magnesium) .... One Daily 22)  Clotrimazole-Betamethasone 1-0.05 % Crea (Clotrimazole-Betamethasone) .... Apply To Bottom Two Times A Day For 10 Days, 30 Gm Tube 23)  Amantadine Hcl 100 Mg Tabs (Amantadine Hcl) .... Take One Tab Every Am  Allergies (verified): 1)  ! * Sinemet 2)  Bactrim Ds (Sulfamethoxazole-Trimethoprim) 3)  Gabapentin (Gabapentin) 4)  Codeine 5)  Effexor Xr (Venlafaxine Hcl) 6)  Cymbalta (Duloxetine Hcl) 7)  Lipitor (Atorvastatin Calcium)    Prevention & Chronic Care Immunizations   Influenza vaccine: Fluvax MCR  (08/27/2009)   Influenza vaccine due: 07/21/2009    Tetanus booster: 12/29/2004: Done.   Tetanus booster due: 12/30/2014    Pneumococcal vaccine: Done.  (09/30/1992)   Pneumococcal vaccine deferral: Not indicated  (06/25/2009)   Pneumococcal vaccine due: None    H. zoster vaccine: 02/19/2008: given  Colorectal Screening   Hemoccult: Done.  (12/30/2002)   Hemoccult due: Not Indicated    Colonoscopy: Done.  (08/31/2006)   Colonoscopy due: 08/31/2016  Other Screening   Pap smear: Done.  (07/31/1993)   Pap smear due: Not Indicated    Mammogram: normal  (07/01/2008)   Mammogram action/deferral: Not indicated  (02/05/2010)   Mammogram due: 07/01/2009    DXA bone density scan: Done.  (05/31/2006)   DXA bone density action/deferral: Not indicated  (06/25/2009)   DXA scan due: None    Smoking status: never  (01/14/2010)  Lipids   Total Cholesterol: 135  (06/20/2008)   LDL: 50  (06/20/2008)   LDL Direct: 51  (10/06/2009)   HDL: 55  (06/20/2008)   Triglycerides: 149  (06/20/2008)    SGOT (AST): 20  (10/06/2009)   SGPT (ALT): 9  (10/06/2009)   Alkaline phosphatase: 68   (10/06/2009)   Total bilirubin: 0.4  (10/06/2009)    Lipid flowsheet reviewed?: Yes   Progress toward LDL goal: At goal  Hypertension   Last Blood Pressure: 155 / 82  (02/04/2010)   Serum creatinine: 1.11  (10/06/2009)   Serum potassium 4.4  (10/06/2009)    Hypertension flowsheet reviewed?: Yes   Progress toward BP goal: Deteriorated  Self-Management Support :   Personal Goals (by the next clinic visit) :      Personal blood pressure goal: 140/90  (06/25/2009)     Personal LDL goal: 70  (06/25/2009)    Hypertension self-management support: Not documented    Lipid self-management support: Not documented

## 2010-11-30 NOTE — Consult Note (Signed)
Summary: [facility]  [facility]   Imported By: [name] 12/23/2009 14:30:43  _____________________________________________________________________  External Attachment:    Type:   Image     Comment:   External Document

## 2010-11-30 NOTE — Miscellaneous (Signed)
Summary: Ensure rx   Clinical Lists Changes  Medications: Rx of ENSURE  LIQD (NUTRITIONAL SUPPLEMENTS) Drink one can 1 hour before meals;  #0 x 0;  Signed;  Entered by: Zachery Dauer MD;  Authorized by: Zachery Dauer MD;  Method used: Historical    Prescriptions: ENSURE  LIQD (NUTRITIONAL SUPPLEMENTS) Drink one can 1 hour before meals  #0 x 0   Entered and Authorized by:   Zachery Dauer MD   Signed by:   Zachery Dauer MD on 08/25/2010   Method used:   Historical   RxID:   [id number]

## 2010-11-30 NOTE — Progress Notes (Signed)
Summary: triage  Phone Note Call from Patient Call back at Home Phone [phone]   Caller: Patient Summary of Call: pt is dizzy when she stands and wants to come in today or tomorrow - wants to see [facility] Initial call taken by: De Nurse,  June 14, 2010 9:37 AM  Follow-up for Phone Call        dizzy off & on x 2 wks. wants pcp only. appt tomorrow at 1:30. advised to drink plenty of water to see if this helps Follow-up by: Golden Circle RN,  June 14, 2010 10:07 AM  Additional Follow-up for Phone Call Additional follow up Details #1::        It would be better to have her come in this afternoon when I am in [facility] with only one patient scheduled. Can this be changed? Additional Follow-up by: Zachery Dauer MD,  June 14, 2010 10:14 AM

## 2010-11-30 NOTE — Assessment & Plan Note (Signed)
Summary: Percocet refill  Prescriptions: OXYCODONE-ACETAMINOPHEN 5-325 MG TABS (OXYCODONE-ACETAMINOPHEN) 1 tab by mouth three times a day. May have additional dose every 3 hours prn  #120 x 0   Entered and Authorized by:   Zachery Dauer MD   Signed by:   Zachery Dauer MD on 09/17/2010   Method used:   Historical   RxID:   [id number]  Form for [facility] for 90 tablets signed and put in fax back box.

## 2010-11-30 NOTE — Assessment & Plan Note (Signed)
Summary: form 485 reviewed

## 2010-11-30 NOTE — Assessment & Plan Note (Signed)
Summary: f/u/kh   Vital Signs:  Patient profile:   77 year old female Height:      62.5 inches Weight:      128.5 pounds BMI:     23.21 Pulse rate:   87 / minute BP sitting:   140 / 71  (right arm) Cuff size:   regular  Vitals Entered By: Renato Battles slade,cma CC: f/up last OV. had flu shot already. wants to discuss pain meds  Is Patient Diabetic? No Pain Assessment Patient in pain? no        Primary Care Provider:  Zachery Dauer MD  CC:  f/up last OV. had flu shot already. wants to discuss pain meds .  History of Present Illness: Continues to have problems getting her pain medication in a timely manner, gets one early AM with her thyroid medication, but other doses are one or two hours after the scheduled time.  Severe pain in right upper leg with weight bearing, and short of breath with walking. daughter-in-law, Harriett Sine doesn't see any big change in these.   Her daughter-in-law things she is having more memory problems and is not sure she should self-administer these meds. She has a roommate who she likes.  Gets dizzy at times, but doesn't ask for the Meclizine.  Thinks she is due to see Dr Clarene Duke in November  Habits & Providers  Alcohol-Tobacco-Diet     Tobacco Status: quit > 6 months  Current Medications (verified): 1)  Levothroid 75 Mcg Tabs (Levothyroxine Sodium) .... Take 1 Tablet By Mouth Once A Day At Bedtime 2)  Boniva 3 Mg/84ml  Kit (Ibandronate Sodium) .... Inject Every 3 Months 3)  Furosemide 40 Mg  Tabs (Furosemide) .... Take One in A.m. 4)  Isosorbide Mononitrate Cr 30 Mg Xr24h-Tab (Isosorbide Mononitrate) .... Take One Tablet Daily 5)  Plavix 75 Mg Tabs (Clopidogrel Bisulfate) .... Take 1 Tablet By Mouth Once A Day 6)  Potassium Chloride Crys Cr 20 Meq Tbcr (Potassium Chloride Crys Cr) .... Take 1 Tablet By Mouth Once A Day 7)  Simvastatin 20 Mg Tabs (Simvastatin) .... Take 1 Tablet By Mouth Every Night 8)  Sm Calcium/vitamin D 500-200 Mg-Unit Tabs (Calcium  Carbonate-Vitamin D) .... Take 1 Tablet By Mouth Twice A Day 9)  Zyrtec Allergy 10 Mg Tabs (Cetirizine Hcl) .Marland Kitchen.. 1 Tablet By Mouth Once A Day 10)  Nitroglycerin 0.4 Mg Subl (Nitroglycerin) .... Take 1 Tab Sl As Needed Chest Pain 11)  Proair Hfa 108 (90 Base) Mcg/act Aers (Albuterol Sulfate) .... 2 Puffs Every 4 Hour Prn 12)  Folic Acid 1 Mg  Tabs (Folic Acid) .... One Dialy 13)  Toprol Xl 25 Mg Xr24h-Tab (Metoprolol Succinate) .... Take 2 Tabs By Mouth Daily 14)  Premarin 0.625 Mg/gm Crea (Estrogens, Conjugated) .... Apply Daily Vaginally 15)  Ferrous Sulfate 324 Mg Tbec (Ferrous Sulfate) .... Take One Tablet Daily 16)  Anusol-Hc 2.5 % Crea (Hydrocortisone) .... Apply Two Times A Day 17)  Baby Aspirin 81 Mg Chew (Aspirin) .... Take One Tablet Daily 18)  Vitamin D 1000 Unit Tabs (Cholecalciferol) .... Take One Tablet Daily 19)  Nexium 40 Mg Cpdr (Esomeprazole Magnesium) .... One Daily 20)  Oxycodone-Acetaminophen 5-325 Mg Tabs (Oxycodone-Acetaminophen) .Marland Kitchen.. 1 Tab By Mouth Three Times A Day. May Have Additional Dose Every 3 Hours Prn 21)  Ensure  Liqd (Nutritional Supplements) .... Drink One Can 1 Hour Before Meals 22)  Meclizine Hcl 12.5 Mg Tabs (Meclizine Hcl) .... Take One Tab Every 4 Hr As Needed Vertigo.  23)  Acetaminophen 650 Mg Cr-Tabs (Acetaminophen) .... Take One Tab Daily At Bedtime 24)  Mirtazapine 15 Mg Tabs (Mirtazapine) .... 1/2 Tab At Bedtime (This Patient Resides At [facility] in [place]) 25)  Miralax  Powd (Polyethylene Glycol 3350) .... Take One Pack Daily If Needed For Constipation.  Allergies (verified): 1)  ! * Sinemet 2)  Bactrim Ds (Sulfamethoxazole-Trimethoprim) 3)  Gabapentin (Gabapentin) 4)  Codeine 5)  Effexor Xr (Venlafaxine Hcl) 6)  Cymbalta (Duloxetine Hcl) 7)  Lipitor (Atorvastatin Calcium)  Social History: Smoking Status:  quit > 6 months  Physical Exam  General:  Frail appearing, using a walker, leaning forward position. Lungs:   Normal respiratory effort, chest expands symmetrically. Lungs are clear to auscultation, no crackles or wheezes. Heart:  Quadrigeminy,  2/6 HSM Msk:  pain and tender ness over right lower lumbar, pain with rotation to right hip worsened by hyperextension.  Extremities:  1+ left pedal edema and 2+ right pedal edema.     Impression & Recommendations:  Problem # 1:  SPINAL STENOSIS OF LUMBAR REGION (ICD-724.02) Assessment Unchanged  Problem # 2:  GAIT, ABNORMAL (ICD-781.2) She continues to be as active with her walker as possible  Problem # 3:  ATRIAL FIBRILLATION, PAROXYSMAL, HX OF (ICD-V12.50) Sounds like PVC's in quadrigeminal pattern  Problem # 4:  LOSS OF WEIGHT (ICD-783.21) Assessment: Improved  Problem # 5:  ANXIETY (ICD-300.00) daughter-in-law observes that her memory is worse when she is aggravated Her updated medication list for this problem includes:    Mirtazapine 15 Mg Tabs (Mirtazapine) .Marland Kitchen... 1/2 tab at bedtime (this patient resides at [facility] in [place] )  Complete Medication List: 1)  Levothroid 75 Mcg Tabs (Levothyroxine sodium) .... Take 1 tablet by mouth once a day at bedtime 2)  Boniva 3 Mg/66ml Kit (Ibandronate sodium) .... Inject every 3 months 3)  Furosemide 40 Mg Tabs (Furosemide) .... Take one in a.m. 4)  Isosorbide Mononitrate Cr 30 Mg Xr24h-tab (Isosorbide mononitrate) .... Take one tablet daily 5)  Plavix 75 Mg Tabs (Clopidogrel bisulfate) .... Take 1 tablet by mouth once a day 6)  Potassium Chloride Crys Cr 20 Meq Tbcr (Potassium chloride crys cr) .... Take 1 tablet by mouth once a day 7)  Simvastatin 20 Mg Tabs (Simvastatin) .... Take 1 tablet by mouth every night 8)  Sm Calcium/vitamin D 500-200 Mg-unit Tabs (Calcium carbonate-vitamin d) .... Take 1 tablet by mouth twice a day 9)  Zyrtec Allergy 10 Mg Tabs (Cetirizine hcl) .Marland Kitchen.. 1 tablet by mouth once a day 10)  Nitroglycerin 0.4 Mg Subl (Nitroglycerin) .... Take 1 tab sl as needed  chest pain 11)  Proair Hfa 108 (90 Base) Mcg/act Aers (Albuterol sulfate) .... 2 puffs every 4 hour prn 12)  Folic Acid 1 Mg Tabs (Folic acid) .... One dialy 13)  Toprol Xl 25 Mg Xr24h-tab (Metoprolol succinate) .... Take 2 tabs by mouth daily 14)  Premarin 0.625 Mg/gm Crea (Estrogens, conjugated) .... Apply daily vaginally 15)  Ferrous Sulfate 324 Mg Tbec (Ferrous sulfate) .... Take one tablet daily 16)  Anusol-hc 2.5 % Crea (Hydrocortisone) .... Apply two times a day 17)  Baby Aspirin 81 Mg Chew (Aspirin) .... Take one tablet daily 18)  Vitamin D 1000 Unit Tabs (Cholecalciferol) .... Take one tablet daily 19)  Nexium 40 Mg Cpdr (Esomeprazole magnesium) .... One daily 20)  Oxycodone-acetaminophen 5-325 Mg Tabs (Oxycodone-acetaminophen) .Marland Kitchen.. 1 tab by mouth three times a day. may have additional dose every  3 hours prn 21)  Ensure Liqd (Nutritional supplements) .... Drink one can 1 hour before meals 22)  Meclizine Hcl 12.5 Mg Tabs (Meclizine hcl) .... Take one tab every 4 hr as needed vertigo. 23)  Acetaminophen 650 Mg Cr-tabs (Acetaminophen) .... Take one tab daily at bedtime 24)  Mirtazapine 15 Mg Tabs (Mirtazapine) .... 1/2 tab at bedtime (this patient resides at [facility] in [place]) 25)  Miralax Powd (Polyethylene glycol 3350) .... Take one pack daily if needed for constipation.  Other Orders: Vit B12 1000 mcg (Z6109)  Patient Instructions: 1)  Please schedule a follow-up appointment in 2 months.  2)  I will call to speak to the RN about pain medication administration. Prescriptions: OXYCODONE-ACETAMINOPHEN 5-325 MG TABS (OXYCODONE-ACETAMINOPHEN) 1 tab by mouth three times a day. May have additional dose every 3 hours prn  #90 x 0   Entered and Authorized by:   Zachery Dauer MD   Signed by:   Zachery Dauer MD on 07/29/2010   Method used:   Print then Give to Patient   RxID:   [id number]    Geriatric Assessment:  Mental Status Exam: (value/max value)     Orientation to Time: 5/5    Orientation to Place: 5/5    Registration: 2/3    Attention/Calculation: 5/5    Recall: 2/3    Language-name 2 objects: 2/2    Language-repeat: 0/1    Language-follow 3-step command: 3/3    Language-read and follow direction: 1/1    Write a sentence: 1/1    Copy design: 0/1    Required > 3 attempts to register- difficulty with hearing.  But even after she had repeated the three objects to me, she continued to mix up the words.  On delayed recall- she substituted money for penny, forgot apple, and remembered table. 5th grade education. 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   Tetanus booster due: 12/30/2014    Pneumococcal vaccine: Done.  (09/30/1992)   Pneumococcal vaccine deferral: Not indicated  (06/25/2009)   Pneumococcal vaccine due: None    H. zoster vaccine: 02/19/2008: given  Colorectal Screening   Hemoccult: Done.  (12/30/2002)   Hemoccult due: Not Indicated    Colonoscopy: Done.  (08/31/2006)   Colonoscopy due: 08/31/2016  Other Screening   Pap smear: Done.  (07/31/1993)   Pap smear due: Not Indicated    Mammogram: normal  (07/01/2008)   Mammogram action/deferral: Not indicated  (02/05/2010)   Mammogram due: 07/01/2009    DXA bone density scan: Done.  (05/31/2006)   DXA bone density  action/deferral: Not indicated  (06/25/2009)   DXA scan due: None    Smoking status: quit > 6 months  (07/29/2010)  Lipids   Total Cholesterol:  135  (06/20/2008)   LDL: 50  (06/20/2008)   LDL Direct: 51  (10/06/2009)   HDL: 55  (06/20/2008)   Triglycerides: 149  (06/20/2008)    SGOT (AST): 17  (06/14/2010)   SGPT (ALT): <8 U/L  (06/14/2010)   Alkaline phosphatase: 57  (06/14/2010)   Total bilirubin: 0.5  (06/14/2010)    Lipid flowsheet reviewed?: Yes   Progress toward LDL goal: At goal  Hypertension   Last Blood Pressure: 140 / 71  (07/29/2010)   Serum creatinine: 0.92  (06/14/2010)   Serum potassium 4.9  (06/14/2010)    Hypertension flowsheet reviewed?: Yes   Progress toward BP goal: Improved  Self-Management Support :   Personal Goals (by the next clinic visit) :      Personal blood pressure goal: 140/90  (06/25/2009)     Personal LDL goal: 70  (06/25/2009)    Hypertension self-management support: Not documented    Lipid self-management support: Not documented     Appended Document: f/u/kh    Clinical Lists Changes  Orders: Added new Test order of [facility]- Est  Level 4 (16109) - Signed

## 2010-11-30 NOTE — Assessment & Plan Note (Signed)
Summary: tb test/kh  Nurse Visit   Allergies: 1)  ! * Sinemet 2)  Bactrim Ds (Sulfamethoxazole-Trimethoprim) 3)  Gabapentin (Gabapentin) 4)  Codeine 5)  Effexor Xr (Venlafaxine Hcl) 6)  Cymbalta (Duloxetine Hcl) 7)  Lipitor (Atorvastatin Calcium)  Immunizations Administered:  PPD Skin Test:    Vaccine Type: PPD    Site: left forearm    Mfr: [employer]    Dose: 0.1 ml    Route: ID    Given by: Theresia Lo RN    Exp. Date: 08/13/2011    Lot #: [id number]  Orders Added: 1)  TB Skin Test [86580] 2)  Admin 1st Vaccine [phone]

## 2010-11-30 NOTE — Assessment & Plan Note (Signed)
Summary: not feeling well/bmc   Vital Signs:  Patient profile:   77 year old female Height:      62.5 inches Weight:      124 pounds BMI:     22.40 Temp:     97.8 degrees F oral Pulse rate:   99 / minute BP sitting:   159 / 81  (right arm) Cuff size:   regular  Vitals Entered By: Tessie Fass CMA (July 01, 2010 10:24 AM) CC: right hip pain. abdominal pain and diarrhea x 2 days   Primary Care Provider:  Zachery Dauer MD  CC:  right hip pain. abdominal pain and diarrhea x 2 days.  History of Present Illness: Brought in by daughter in law, [place]. Has had diarrhea since yesterday, no nausea or vomiting. Felt cold and chilly yesterday, no fever. Hasn't had oxycodone medication for 5 days. Takes Miralax on her own if she feels like she's getting constipated, but hasn't taken it recently.   She denies dysuria and her right sided pain is in the low back. Urge incontinence has been a problem for over a year.   After she left I was called by the nurse from her assisted living facility. They couldn't use the oxycodone/acet prescription written because the # of pills was listed as 0, apparently my error. They couldn't use the second one for #90 because the significant was for every 4 hr. I will print one for Harriett Sine to fill after 9/15 so she won't have a gap again. I also printed another to fill now #90 which Harriett Sine will pick up to take to the assisted living facility.   Allergies: 1)  ! * Sinemet 2)  Bactrim Ds (Sulfamethoxazole-Trimethoprim) 3)  Gabapentin (Gabapentin) 4)  Codeine 5)  Effexor Xr (Venlafaxine Hcl) 6)  Cymbalta (Duloxetine Hcl) 7)  Lipitor (Atorvastatin Calcium)  Physical Exam  General:  Frail appearing, using a walker, leaning forward position. Lungs:  Normal respiratory effort, chest expands symmetrically. Lungs are clear to auscultation, no crackles or wheezes. Heart:  rate 99, 2/6 HSM Abdomen:  soft and non-tender.  Nl bowel sounds, Examined sitting. Extremities:   1+ left pedal edema and 1+ right pedal edema.     Impression & Recommendations:  Problem # 1:  DIARRHEA (ICD-787.91)  No ill contacts. May be narcotic withdrawal due to dysfunctional communications with the assisted living facility. There now should be 2 prescriptions available that will cover her pain. Consider going back to hydrocodone to allow refills and faxed prescriptions   Orders: [facility]- Est Level  3 (16109)  Problem # 2:  SPINAL STENOSIS OF LUMBAR REGION (ICD-724.02)  Difficulty controlling symptoms due to sensivity to medication side effects.   Orders: [facility]- Est Level  3 (60454)  Problem # 3:  SYMPTOM, INCONTINENCE, URGE (ICD-788.31) I don't believe she has a urinary tract infection and wish to avoid antibiotics which have contributed to diarrhea in the past  Complete Medication List: 1)  Levothroid 75 Mcg Tabs (Levothyroxine sodium) .... Take 1 tablet by mouth once a day at bedtime 2)  Boniva 3 Mg/54ml Kit (Ibandronate sodium) .... Inject every 3 months 3)  Furosemide 40 Mg Tabs (Furosemide) .... Take one in a.m. 4)  Isosorbide Mononitrate Cr 30 Mg Xr24h-tab (Isosorbide mononitrate) .... Take one tablet daily 5)  Plavix 75 Mg Tabs (Clopidogrel bisulfate) .... Take 1 tablet by mouth once a day 6)  Potassium Chloride Crys Cr 20 Meq Tbcr (Potassium chloride crys cr) .... Take 1 tablet by  mouth once a day 7)  Simvastatin 20 Mg Tabs (Simvastatin) .... Take 1 tablet by mouth every night 8)  Sm Calcium/vitamin D 500-200 Mg-unit Tabs (Calcium carbonate-vitamin d) .... Take 1 tablet by mouth twice a day 9)  Zyrtec Allergy 10 Mg Tabs (Cetirizine hcl) .Marland Kitchen.. 1 tablet by mouth once a day 10)  Nitroglycerin 0.4 Mg Subl (Nitroglycerin) .... Take 1 tab sl as needed chest pain 11)  Proair Hfa 108 (90 Base) Mcg/act Aers (Albuterol sulfate) .... 2 puffs every 4 hour prn 12)  Folic Acid 1 Mg Tabs (Folic acid) .... One dialy 13)  Toprol Xl 25 Mg Xr24h-tab (Metoprolol succinate) .... Take 2 tabs by  mouth daily 14)  Premarin 0.625 Mg/gm Crea (Estrogens, conjugated) .... Apply daily vaginally 15)  Ferrous Sulfate 324 Mg Tbec (Ferrous sulfate) .... Take one tablet daily 16)  Anusol-hc 2.5 % Crea (Hydrocortisone) .... Apply two times a day 17)  Baby Aspirin 81 Mg Chew (Aspirin) .... Take one tablet daily 18)  Vitamin D 1000 Unit Tabs (Cholecalciferol) .... Take one tablet daily 19)  Nexium 40 Mg Cpdr (Esomeprazole magnesium) .... One daily 20)  Oxycodone-acetaminophen 5-325 Mg Tabs (Oxycodone-acetaminophen) .Marland Kitchen.. 1 tab by mouth three times a day. may have additional dose every 3 hours prn 21)  Ensure Liqd (Nutritional supplements) .... Drink one can 1 hour before meals 22)  Meclizine Hcl 12.5 Mg Tabs (Meclizine hcl) .... Take one tab every 4 hr as needed vertigo. 23)  Acetaminophen 650 Mg Cr-tabs (Acetaminophen) .... Take one tab daily at bedtime 24)  Mirtazapine 15 Mg Tabs (Mirtazapine) .... 1/2 tab at bedtime (this patient resides at [facility] in [place]) 25)  Miralax Powd (Polyethylene glycol 3350) .... Take one pack daily if needed for constipation.  Patient Instructions: 1)  Continue same appointment. Take the Miralax only if needed for constipation from Oxycodone. Call if the diarrhea continues.  Prescriptions: OXYCODONE-ACETAMINOPHEN 5-325 MG TABS (OXYCODONE-ACETAMINOPHEN) 1 tab by mouth three times a day. May have additional dose every 3 hours prn  #90 x 0   Entered and Authorized by:   Zachery Dauer MD   Signed by:   Zachery Dauer MD on 07/01/2010   Method used:   Print then Give to Patient   RxID:   [id number] OXYCODONE-ACETAMINOPHEN 5-325 MG TABS (OXYCODONE-ACETAMINOPHEN) 1 tab by mouth three times a day. May have additional dose every 3 hours prn  #90 x 0   Entered and Authorized by:   Zachery Dauer MD   Signed by:   Zachery Dauer MD on 07/01/2010   Method used:   Print then Give to Patient   RxID:   [id number] OXYCODONE-ACETAMINOPHEN 5-325 MG TABS  (OXYCODONE-ACETAMINOPHEN) 1 tab by mouth three times a day. May have additional dose every 3 hours prn  #90 x 0   Entered and Authorized by:   Zachery Dauer MD   Signed by:   Zachery Dauer MD on 07/01/2010   Method used:   Print then Give to Patient   RxID:   [id number]  Couldn't print on prescription paper so will redo

## 2010-11-30 NOTE — Miscellaneous (Signed)
Summary: Hosp Admission for AMS  Clinical Lists Changes  Problems: Assessed LOW BACK PAIN SYNDROME, SEVERE as comment only - Tylenol 650mg  three times a day  No narcotics  Assessed HYPERTENSION, BENIGN SYSTEMIC as comment only - BP stable.  Cont home meds.   Her updated medication list for this problem includes:    Furosemide 40 Mg Tabs (Furosemide) .Marland Kitchen... Take one in a.m.    Toprol Xl 25 Mg Xr24h-tab (Metoprolol succinate) .Marland Kitchen... Take 2 tabs by mouth daily  Assessed CHF, EJECTION FRACTION > OR = 50% as comment only -  Her updated medication list for this problem includes:    Furosemide 40 Mg Tabs (Furosemide) .Marland Kitchen... Take one in a.m.    Plavix 75 Mg Tabs (Clopidogrel bisulfate) .Marland Kitchen... Take 1 tablet by mouth once a day    Toprol Xl 25 Mg Xr24h-tab (Metoprolol succinate) .Marland Kitchen... Take 2 tabs by mouth daily    Baby Aspirin 81 Mg Chew (Aspirin) .Marland Kitchen... Take one tablet daily  Assessed CORONARY, ARTERIOSCLEROSIS as comment only -  Her updated medication list for this problem includes:    Furosemide 40 Mg Tabs (Furosemide) .Marland Kitchen... Take one in a.m.    Isosorbide Mononitrate Cr 30 Mg Xr24h-tab (Isosorbide mononitrate) .Marland Kitchen... Take one tablet daily    Plavix 75 Mg Tabs (Clopidogrel bisulfate) .Marland Kitchen... Take 1 tablet by mouth once a day    Nitroglycerin 0.4 Mg Subl (Nitroglycerin) .Marland Kitchen... Take 1 tab sl as needed chest pain    Toprol Xl 25 Mg Xr24h-tab (Metoprolol succinate) .Marland Kitchen... Take 2 tabs by mouth daily    Baby Aspirin 81 Mg Chew (Aspirin) .Marland Kitchen... Take one tablet daily  Assessed RENAL DISEASE, CHRONIC, STAGE III as comment only - Cr stable.  Will give gental hydration.   Assessed HYPOTHYROIDISM, UNSPECIFIED as comment only - Last TSH check was 09/2009, 1.045.  Will check TSH.  Cont Levothroid 75 Her updated medication list for this problem includes:    Levothroid 75 Mcg Tabs (Levothyroxine sodium) .Marland Kitchen... Take 1 tablet by mouth once a day at bedtime  Assessed OSTEOPOROSIS, UNSPECIFIED as comment only -  Her  updated medication list for this problem includes:    Boniva 3 Mg/18ml Kit (Ibandronate sodium) ..... Inject every 3 months    Sm Calcium/vitamin D 500-200 Mg-unit Tabs (Calcium carbonate-vitamin d) .Marland Kitchen... Take 1 tablet by mouth twice a day    Vitamin D 1000 Unit Tabs (Cholecalciferol) .Marland Kitchen... Take one tablet daily  Assessed GASTROESOPHAGEAL REFLUX, NO ESOPHAGITIS as comment only -  Her updated medication list for this problem includes:    Nexium 40 Mg Cpdr (Esomeprazole magnesium) ..... One daily  Assessed HYPERLIPIDEMIA as comment only -  Her updated medication list for this problem includes:    Simvastatin 20 Mg Tabs (Simvastatin) .Marland Kitchen... Take 1 tablet by mouth every night  Observations: Added new observation of ROS:      The patient complains of chest pain.  The patient denies fever, syncope, prolonged cough, headaches, hemoptysis, abdominal pain, melena, and hematochezia.   (04/22/2010 0:04) Added new observation of ROS: GI: Denies abdominal pain, melena, hematochezia (04/22/2010 0:04) Added new observation of ZOX:WRUEAV: Denies hemoptysis (04/22/2010 0:04) Added new observation of ROS: NEURO: Denies headaches (04/22/2010 0:04) Added new observation of WUJ:WJXBJYN: Denies fever (04/22/2010 0:04) Added new observation of ROS: CARDIAC: Complains of chest pain; Denies syncope (04/22/2010 0:04) Added new observation of HPI: History given by EDP and granddaughter.  Patient is  [name] who resides in a SNF.  Her family brought her to  the ED today whe they noticed that she was not acting normally.  They thought she was complaining of chest pain.  In the ED she was evaluated by EDP who states that pt only answered questions with one-word answers.  She also speaks incoherently, like a "word-salad."  When I arrived to examined the patient, she was asleep.  She did not respond to my voice or commands.  She did respond to sternal rubs with a soft cry.   (04/22/2010 0:04) Added new observation of CHIEF  CMPLNT: Altered Mental Status (04/22/2010 0:04) Added new observation of PE COMMENTS:  1.  6.9> 12.5/36.3 < 194 MCV 96.2, ANC 4.7 2.  Na 138, K 4.7, Cl 99, C02 30, Bun 24, Cr 1.01, Gluc 116, Ca 9.8              3.   INR 1.04 4.  POC CE  CKMB 1.8, Trop <0.05, Myoglobin 88.6              5.  Mild left basilar atelectasis noted.  Findings of COPD.  Small bilateral pulmonary nodules better characterized on prior CTA; single visualized small right midlung nodule appears stable. 6.  Head CT:  Stable.  No acute intracranial abnormality. Atrophy.  Chronic small vessel white matter disease.  (04/22/2010 0:04) Added new observation of CERVIC NODES: No lymphadenopathy noted (04/22/2010 0:04) Added new observation of SKIN SQ INSP: turgor normal.   (04/22/2010 0:04) Added new observation of MSK EXAM: cannot assess (04/22/2010 0:04) Added new observation of NEURO EXAM: not awake.  not alert.  arousable to painful sternal rubs (04/22/2010 0:04) Added new observation of PEDAL PULSE: R radial normal, R dorsalis pedis normal, L radial normal, and L dorsalis pedis normal.   (04/22/2010 0:04) Added new observation of EXTREMITIES: No clubbing, cyanosis, edema, or deformity noted with normal full range of motion of all joints.   (04/22/2010 0:04) Added new observation of ABDOMEN EXAM: Bowel sounds positive,abdomen soft and non-tender without masses, organomegaly or hernias noted. (04/22/2010 0:04) Added new observation of HEART EXAM: Normal rate and regular rhythm. S1 and S2 normal without gallop, murmur, click, rub or other extra sounds. (04/22/2010 0:04) Added new observation of LUNG EXAM: normal respiratory effort and normal breath sounds.   (04/22/2010 0:04) Added new observation of ORAL EXAM: dry lips.  dry mm (04/22/2010 0:04) Added new observation of EYE EXAM: pinpoint pupils (04/22/2010 0:04) Added new observation of HD/FACE INSP: normocephalic and atraumatic.   (04/22/2010 0:04) Added new observation of  PEADULT: Ana Dewitt MD ~General`Gen appear ~Head`hd/face insp ~Eyes`Eye exam ~Mouth`Oral exam ~Lungs`lung exam ~Heart`Heart exam ~Abdomen`Abdomen exam ~Extremities`Extremities ~Pulses`pedal pulse ~Neurologic`Neuro exam ~Msk`MSK EXAM ~Skin`skin sq insp ~Cervical Nodes`cervic nodes ~Additional Exam`PE comments (04/22/2010 0:04) Added new observation of GEN APPEAR: pale.  not awake. arousable to painful stimuli (04/22/2010 0:04) Added new observation of PULSE RATE: 72 /min (04/22/2010 0:04) Added new observation of OXYGEN FLOW: Room air L/min (04/22/2010 0:04) Added new observation of BP DIA LYING: 59 mm Hg (04/22/2010 0:04) Added new observation of BP SYS LYING: 134 mm Hg (04/22/2010 0:04) Added new observation of O2SAT(OXIM): 99 % (04/22/2010 0:04) Added new observation of RESP RATE: 22 /min (04/22/2010 0:04) Added new observation of TEMPERATURE: 97.4 deg F (04/22/2010 0:04) Added new observation of NKA: F  (04/22/2010 0:04) Added new observation of ALLERGY REV: Done  (04/22/2010 0:04) Added new observation of PRIMARY MD: Zachery Dauer MD  (04/22/2010 0:04)      Primary Care Provider:  Zachery Dauer MD  CC:  Altered Mental Status.  History of Present Illness: History given by EDP and granddaughter.  Patient is  [name] who resides in a SNF.  Her family brought her to the ED today whe they noticed that she was not acting normally.  They thought she was complaining of chest pain.  In the ED she was evaluated by EDP who states that pt only answered questions with one-word answers.  She also speaks incoherently, like a "word-salad."  When I arrived to examined the patient, she was asleep.  She did not respond to my voice or commands.  She did respond to sternal rubs with a soft cry.      Past History:  Past Medical History: Last updated: 08/21/2008 anisocoria, DUB and labial irrit evaluated by Dr Audie Box  hallux valgus,  RLL lung lesion on chest CT 1999 venous L  leg DVT 7/00 thyroid  hemorrhage 1/01 LS spine and hips L 4-5 space narrowing - 03/02/2000 - sees Dr. August Saucer (recent injection in 9/09 by ?) CT - neck Goiter - 03/02/2000 colonoscopy diffuse diverticulosis & hem - 01/02/2003 Barium Swallow - 06/21/2001 Cardiac Cath - no change - 06/14/2006, Cardiac Cath 8/98 -, Cardiac Cath LAD stent open, RCA stent 80% open - 03/14/2005, Cardiac Cath-stents patent - 09/12/2005, Cardiolite Neg 6/00 -, Cardiolite-inferior wall thinning, EF 89% - 07/01/2004 CT - Abdominal 6 mm RL lung lesion - 12/10/2002 CT - Chest RL lesion unchanged - 08/30/2004 Vit B12 - 12/30/2002, vit B12 normal 1 year without injection - 05/28/2004  MRI - cerebellar atrophy - 07/17/2002 CT - Chest RL lesion unchanged - 06/06/2006 EGD - mild gastritis - 10/17/2006, EGD 7/99, repeat  gastric polyp - 12/01/2001, EGD benign gastric polyp - 12/29/2001 TSH nl - 01/29/2006,  Adenosine myoview low risk - 01/29/2006  Past Surgical History: Last updated: 03/20/2007 partial thyroidectomy 75 Angioplasty and stent 9/99  Appendectomy  Cervical laminectomy  Cholecystectomy   D&C 4/00 benign  coronary stent 8/00  L Rotator cuff repair ORIF R hip fracture - 03/16/2000  Splenectomy  Family History: Last updated: 06/25/2009  Htn and heart disease M died age 87  Kidney disease F died age 3 Alcohol complications killed Bro at age 44 Breast CA - daughter died age 85, Sister died age 74 Sister living - age 55, arthritis CAD, DM - son, Dorinda Hill, in [place] CVA - [place], [place], [place] mild PVD - Windy Fast, in [place]  Social History: Last updated: 08/21/2008 widowed 1976 Lives with son's family Sons Dorinda Hill and Windy Fast in [place]; Son Chrissie Noa in [place] non-smoker, but uses 1/3 tin of smokeless tobacco daily non-drinker; [employer] faith  Doing some exercises (those given to her after hip fx) daily  Risk Factors: Smoking Status: quit > 6 months (04/15/2010) Cans of tobacco/wk: yes (06/28/2007)   Review of Systems       The patient complains of chest pain.   The patient denies fever, syncope, prolonged cough, headaches, hemoptysis, abdominal pain, melena, and hematochezia.       Medications Prior to Update: 1)  Levothroid 75 Mcg Tabs (Levothyroxine Sodium) .... Take 1 Tablet By Mouth Once A Day At Bedtime 2)  Boniva 3 Mg/54ml  Kit (Ibandronate Sodium) .... Inject Every 3 Months 3)  Furosemide 40 Mg  Tabs (Furosemide) .... Take One in A.m. 4)  Isosorbide Mononitrate Cr 30 Mg Xr24h-Tab (Isosorbide Mononitrate) .... Take One Tablet Daily 5)  Plavix 75 Mg Tabs (Clopidogrel Bisulfate) .... Take 1 Tablet By Mouth Once A Day 6)  Potassium Chloride Crys Cr 20 Meq Tbcr (Potassium Chloride Crys Cr) .... Take 1 Tablet By Mouth Once A Day 7)  Simvastatin 20 Mg Tabs (Simvastatin) .... Take 1 Tablet By Mouth Every Night 8)  Sm Calcium/vitamin D 500-200 Mg-Unit Tabs (Calcium Carbonate-Vitamin D) .... Take 1 Tablet By Mouth Twice A Day 9)  Zyrtec Allergy 10 Mg Tabs (Cetirizine Hcl) .Marland Kitchen.. 1 Tablet By Mouth Once A Day 10)  Nitroglycerin 0.4 Mg Subl (Nitroglycerin) .... Take 1 Tab Sl As Needed Chest Pain 11)  Ventolin Hfa 108 (90 Base) Mcg/act Aers (Albuterol Sulfate) .... Take 2 Puffs 4 Times Daily As Needed 12)  Folic Acid 1 Mg  Tabs (Folic Acid) .... One Dialy 13)  Toprol Xl 25 Mg Xr24h-Tab (Metoprolol Succinate) .... Take 2 Tabs By Mouth Daily 14)  Premarin 0.625 Mg/gm Crea (Estrogens, Conjugated) .... Apply Daily Vaginally 15)  Ferrous Sulfate 324 Mg Tbec (Ferrous Sulfate) .... Take One Tablet Daily 16)  Anusol-Hc 2.5 % Crea (Hydrocortisone) .... Apply Two Times A Day 17)  Baby Aspirin 81 Mg Chew (Aspirin) .... Take One Tablet Daily 18)  Vitamin D 1000 Unit Tabs (Cholecalciferol) .... Take One Tablet Daily 19)  Nexium 40 Mg Cpdr (Esomeprazole Magnesium) .... One Daily 20)  Oxycodone-Acetaminophen 5-325 Mg Tabs (Oxycodone-Acetaminophen) .Marland Kitchen.. 1 Tab By Mouth Q 4 Hours As Needed Pain 21)  Oxycontin 10 Mg Xr12h-Tab (Oxycodone Hcl) .... Take 1 Tab Q 12  Hours  Allergies (verified): 1)  ! * Sinemet 2)  Bactrim Ds (Sulfamethoxazole-Trimethoprim) 3)  Gabapentin (Gabapentin) 4)  Codeine 5)  Effexor Xr (Venlafaxine Hcl) 6)  Cymbalta (Duloxetine Hcl) 7)  Lipitor (Atorvastatin Calcium)   Vital Signs:  Patient profile:   77 year old female O2 Sat:      99 % on Room air Temp:     97.4 degrees F Pulse rate:   72 / minute Resp:     22 per minute BP supine:   134 / 59  O2 Flow:  Room air  Physical Exam  General:  pale.  not awake. arousable to painful stimuli Head:  normocephalic and atraumatic.   Eyes:  pinpoint pupils Mouth:  dry lips.  dry mm Lungs:  normal respiratory effort and normal breath sounds.   Heart:  Normal rate and regular rhythm. S1 and S2 normal without gallop, murmur, click, rub or other extra sounds. Abdomen:  Bowel sounds positive,abdomen soft and non-tender without masses, organomegaly or hernias noted. Msk:  cannot assess Pulses:  R radial normal, R dorsalis pedis normal, L radial normal, and L dorsalis pedis normal.   Extremities:  No clubbing, cyanosis, edema, or deformity noted with normal full range of motion of all joints.   Neurologic:  not awake.  not alert.  arousable to painful sternal rubs Skin:  turgor normal.   Cervical Nodes:  No lymphadenopathy noted Additional Exam:  1.  6.9> 12.5/36.3 < 194 MCV 96.2, ANC 4.7 2.  Na 138, K 4.7, Cl 99, C02 30, Bun 24, Cr 1.01, Gluc 116, Ca 9.8              3.   INR 1.04 4.  POC CE  CKMB 1.8, Trop <0.05, Myoglobin 88.6              5.  Mild left basilar atelectasis noted.  Findings of COPD.  Small bilateral pulmonary nodules better characterized on prior CTA; single visualized small right midlung nodule appears stable. 6.  Head CT:  Stable.  No acute  intracranial abnormality. Atrophy.  Chronic small vessel white matter disease.      Impression & Recommendations:  Problem # 1:  Altered Mental Status Currently pt is very sommolent.  Her vitals are stable, but  she is only responding to sternal rubs.  Given that pt is on Oxycontin 10mg  two times a day and oxycodone 5/325 q 4hr, I am concerned that her current state may be secondary to opoids.  She was recently seen at [facility] clinic where her family reported delirium to baclofen and oxycontin.  At that time oxycontin was decreased from 20mg  to 10mg  two times a day.   Pt's exam is currently very difficult to obtain.  I will check on pt after Narcan given.    Will also cycle her enzymes, pt cannot give me a history and granddaughter present is not clear on what happened.    Problem # 2:  LOW BACK PAIN SYNDROME, SEVERE (ICD-724.2) Tylenol 650mg  three times a day  No narcotics  Problem # 3:  HYPERTENSION, BENIGN SYSTEMIC (ICD-401.1) BP stable.  Cont home meds.   Her updated medication list for this problem includes:    Furosemide 40 Mg Tabs (Furosemide) .Marland Kitchen... Take one in a.m.    Toprol Xl 25 Mg Xr24h-tab (Metoprolol succinate) .Marland Kitchen... Take 2 tabs by mouth daily  Problem # 4:  CHF, EJECTION FRACTION > OR = 50% (ICD-428.32)  Her updated medication list for this problem includes:    Furosemide 40 Mg Tabs (Furosemide) .Marland Kitchen... Take one in a.m.    Plavix 75 Mg Tabs (Clopidogrel bisulfate) .Marland Kitchen... Take 1 tablet by mouth once a day    Toprol Xl 25 Mg Xr24h-tab (Metoprolol succinate) .Marland Kitchen... Take 2 tabs by mouth daily    Baby Aspirin 81 Mg Chew (Aspirin) .Marland Kitchen... Take one tablet daily  Problem # 5:  CORONARY, ARTERIOSCLEROSIS (ICD-414.00)  Her updated medication list for this problem includes:    Furosemide 40 Mg Tabs (Furosemide) .Marland Kitchen... Take one in a.m.    Isosorbide Mononitrate Cr 30 Mg Xr24h-tab (Isosorbide mononitrate) .Marland Kitchen... Take one tablet daily    Plavix 75 Mg Tabs (Clopidogrel bisulfate) .Marland Kitchen... Take 1 tablet by mouth once a day    Nitroglycerin 0.4 Mg Subl (Nitroglycerin) .Marland Kitchen... Take 1 tab sl as needed chest pain    Toprol Xl 25 Mg Xr24h-tab (Metoprolol succinate) .Marland Kitchen... Take 2 tabs by mouth daily    Baby Aspirin 81  Mg Chew (Aspirin) .Marland Kitchen... Take one tablet daily  Problem # 6:  RENAL DISEASE, CHRONIC, STAGE III (ICD-585.3) Cr stable.  Will give gental hydration.    Problem # 7:  HYPOTHYROIDISM, UNSPECIFIED (ICD-244.9) Last TSH check was 09/2009, 1.045.  Will check TSH.  Cont Levothroid 75 Her updated medication list for this problem includes:    Levothroid 75 Mcg Tabs (Levothyroxine sodium) .Marland Kitchen... Take 1 tablet by mouth once a day at bedtime  Problem # 8:  OSTEOPOROSIS, UNSPECIFIED (ICD-733.00)  Her updated medication list for this problem includes:    Boniva 3 Mg/41ml Kit (Ibandronate sodium) ..... Inject every 3 months    Sm Calcium/vitamin D 500-200 Mg-unit Tabs (Calcium carbonate-vitamin d) .Marland Kitchen... Take 1 tablet by mouth twice a day    Vitamin D 1000 Unit Tabs (Cholecalciferol) .Marland Kitchen... Take one tablet daily  Problem # 9:  GASTROESOPHAGEAL REFLUX, NO ESOPHAGITIS (ICD-530.81)  Her updated medication list for this problem includes:    Nexium 40 Mg Cpdr (Esomeprazole magnesium) ..... One daily  Problem # 10:  HYPERLIPIDEMIA (ICD-272.4)  Her updated medication  list for this problem includes:    Simvastatin 20 Mg Tabs (Simvastatin) .Marland Kitchen... Take 1 tablet by mouth every night  Problem # 11:  FEN/GI Swallow evaluation by RN.  Gentle hydration.  HH diet if passes swallow eval.  Problem # 12:  Prophylaxis Heparin SQ  Complete Medication List: 1)  Levothroid 75 Mcg Tabs (Levothyroxine sodium) .... Take 1 tablet by mouth once a day at bedtime 2)  Boniva 3 Mg/60ml Kit (Ibandronate sodium) .... Inject every 3 months 3)  Furosemide 40 Mg Tabs (Furosemide) .... Take one in a.m. 4)  Isosorbide Mononitrate Cr 30 Mg Xr24h-tab (Isosorbide mononitrate) .... Take one tablet daily 5)  Plavix 75 Mg Tabs (Clopidogrel bisulfate) .... Take 1 tablet by mouth once a day 6)  Potassium Chloride Crys Cr 20 Meq Tbcr (Potassium chloride crys cr) .... Take 1 tablet by mouth once a day 7)  Simvastatin 20 Mg Tabs (Simvastatin) ....  Take 1 tablet by mouth every night 8)  Sm Calcium/vitamin D 500-200 Mg-unit Tabs (Calcium carbonate-vitamin d) .... Take 1 tablet by mouth twice a day 9)  Zyrtec Allergy 10 Mg Tabs (Cetirizine hcl) .Marland Kitchen.. 1 tablet by mouth once a day 10)  Nitroglycerin 0.4 Mg Subl (Nitroglycerin) .... Take 1 tab sl as needed chest pain 11)  Ventolin Hfa 108 (90 Base) Mcg/act Aers (Albuterol sulfate) .... Take 2 puffs 4 times daily as needed 12)  Folic Acid 1 Mg Tabs (Folic acid) .... One dialy 13)  Toprol Xl 25 Mg Xr24h-tab (Metoprolol succinate) .... Take 2 tabs by mouth daily 14)  Premarin 0.625 Mg/gm Crea (Estrogens, conjugated) .... Apply daily vaginally 15)  Ferrous Sulfate 324 Mg Tbec (Ferrous sulfate) .... Take one tablet daily 16)  Anusol-hc 2.5 % Crea (Hydrocortisone) .... Apply two times a day 17)  Baby Aspirin 81 Mg Chew (Aspirin) .... Take one tablet daily 18)  Vitamin D 1000 Unit Tabs (Cholecalciferol) .... Take one tablet daily 19)  Nexium 40 Mg Cpdr (Esomeprazole magnesium) .... One daily 20)  Oxycodone-acetaminophen 5-325 Mg Tabs (Oxycodone-acetaminophen) .Marland Kitchen.. 1 tab by mouth q 4 hours as needed pain 21)  Oxycontin 10 Mg Xr12h-tab (Oxycodone hcl) .... Take 1 tab q 12 hours

## 2010-11-30 NOTE — Progress Notes (Signed)
Summary: referral  Phone Note Call from Patient Call back at [phone]   Caller: [name] Summary of Call: pt is wanting to get her hearing checked and wants to know who she should go to. Initial call taken by: De Nurse,  August 04, 2010 12:17 PM  Follow-up for Phone Call        dtr states her hearing is bad especially from one ear. states md checked & she did not have wax in her ears. advised calling a hearing aid place so she can be checked & consider buying an aide. told her costs will be from $800 to 2 thousand for one aid. states she will check around & make an appt.  Follow-up by: Golden Circle RN,  August 04, 2010 1:55 PM  Additional Follow-up for Phone Call Additional follow up Details #1::        I called daughter-in-law [name] who would prefer seeing an audiologist first, rather than someone who sells hearing aides so we will make that referral. Additional Follow-up by: Zachery Dauer MD,  August 04, 2010 2:19 PM  New Problems: UNSPECIFIED HEARING LOSS (ICD-389.9)   New Problems: UNSPECIFIED HEARING LOSS (ICD-389.9)  Appended Document: referral Appt. made at [facility] rehab [address] for Mon 10/ 20 at 8:00.  Daughter in Chiropodist contacted and informed.

## 2010-11-30 NOTE — Assessment & Plan Note (Signed)
Summary: BONIVA/KH   Vital Signs:  Patient profile:   77 year old female Height:      62.5 inches Weight:      125 pounds BMI:     22.58 Temp:     97.6 degrees F oral Pulse rate:   84 / minute BP sitting:   152 / 60  (left arm) Cuff size:   regular  Vitals Entered By: Tessie Fass CMA (February 25, 2010 10:06 AM) CC: boniva injection Is Patient Diabetic? No   Primary Care Provider:  Zachery Dauer MD  CC:  boniva injection.  History of Present Illness: Here for IV Boniva   She has made the decision to move to [facility].  Her daughter in law wanted to clarify that it was Mrs. Porada decision.  Mrs. Aguinaldo dislikes being stuck in her room in a trailer, everytime there is a storm she is frightened, as there are large trees that hang over the area of her bedroom.  She would like to socialize, and go places.  She did not like the way amatadine made her feel and it did not seem to help her tremor, she stopped using.  Allergies: 1)  ! * Sinemet 2)  Bactrim Ds (Sulfamethoxazole-Trimethoprim) 3)  Gabapentin (Gabapentin) 4)  Codeine 5)  Effexor Xr (Venlafaxine Hcl) 6)  Cymbalta (Duloxetine Hcl) 7)  Lipitor (Atorvastatin Calcium)  Physical Exam  General:  Usual appearing, obvious tremor Lungs:  normal respiratory effort and normal breath sounds.   Heart:  Low grade murmus, regular rhythm Extremities:  Feet, overlying toes, large bunions, no hard callouses or cornes. Psych:  in good spirits   Impression & Recommendations:  Problem # 1:  OSTEOPOROSIS, UNSPECIFIED (ICD-733.00)  IVP BOniva given without complications.  Check creatitine on Monday when she comes in for a nurse visit. Lot [id number], Exo 04/2011  Her updated medication list for this problem includes:    Boniva 3 Mg/10ml Kit (Ibandronate sodium) ..... Inject every 3 months    Sm Calcium/vitamin D 500-200 Mg-unit Tabs (Calcium carbonate-vitamin d) .Marland Kitchen... Take 1 tablet by mouth twice a day    Vitamin D 1000 Unit Tabs  (Cholecalciferol) .Marland Kitchen... Take one tablet daily  Or[facility] ([facility]  Problem # 2:  HYPERTENSION, BENIGN SYSTEMIC (ICD-401.1) Toprol increased last visit for tremor,BP up today at 152/60. Her updated medication list for this problem includes:    Furosemide 40 Mg Tabs (Furosemide) .Marland Kitchen... Take one in a.m.    Toprol Xl 25 Mg Xr24h-tab (Metoprolol succinate) .Marland Kitchen... Take 2 tabs by mouth daily  Problem # 3:  FOOT PAIN (ICD-729.5) Shoes seemed to crowding toes, has overlapping toes, is using toe separaters on and off.  Visited podiatrist 3 weeks ago.    Complete Medication List: 1)  Levothroid 75 Mcg Tabs (Levothyroxine sodium) .... Take 1 tablet by mouth once a day at bedtime 2)  Boniva 3 Mg/57ml Kit (Ibandronate sodium) .... Inject every 3 months 3)  Furosemide 40 Mg Tabs (Furosemide) .... Take one in a.m. 4)  Hydrocodone-acetaminophen 5-500 Mg Tabs (Hydrocodone-acetaminophen) .... Take 1 tablet by mouth every four hours 5)  Isosorbide Mononitrate Cr 30 Mg Xr24h-tab (Isosorbide mononitrate) .... Take one tablet daily 6)  Plavix 75 Mg Tabs (Clopidogrel bisulfate) .... Take 1 tablet by mouth once a day 7)  Potassium Chloride Crys Cr 20 Meq Tbcr (Potassium chloride crys cr) .... Take 1 tablet by mouth once a day 8)  Simvastatin 20 Mg Tabs (Simvastatin) .... Take 1 tablet  by mouth every night 9)  Sm Calcium/vitamin D 500-200 Mg-unit Tabs (Calcium carbonate-vitamin d) .... Take 1 tablet by mouth twice a day 10)  Zyrtec Allergy 10 Mg Tabs (Cetirizine hcl) .Marland Kitchen.. 1 tablet by mouth once a day 11)  Nitroglycerin 0.4 Mg Subl (Nitroglycerin) .... Take 1 tab sl as needed chest pain 12)  Ventolin Hfa 108 (90 Base) Mcg/act Aers (Albuterol sulfate) .... Take 2 puffs 4 times daily as needed 13)  Folic Acid 1 Mg Tabs (Folic acid) .... One dialy 14)  Toprol Xl 25 Mg Xr24h-tab (Metoprolol succinate) .... Take 2 tabs by mouth daily 15)  Premarin 0.625 Mg/gm Crea (Estrogens, conjugated) .... Apply  daily vaginally 16)  Ferrous Sulfate 324 Mg Tbec (Ferrous sulfate) .... Take one tablet daily 17)  Biofreeze Topical  .... Apply three times a day 18)  Anusol-hc 2.5 % Crea (Hydrocortisone) .... Apply two times a day 19)  Baby Aspirin 81 Mg Chew (Aspirin) .... Take one tablet daily 20)  Vitamin D 1000 Unit Tabs (Cholecalciferol) .... Take one tablet daily 21)  Nexium 40 Mg Cpdr (Esomeprazole magnesium) .... One daily 22)  Clotrimazole-betamethasone 1-0.05 % Crea (Clotrimazole-betamethasone) .... Apply to bottom two times a day for 10 days, 30 gm tube  Other Orders: Future Orders: [facility] ([id number]) ... 01/31/2011  Patient Instructions: 1)  Monday labs and nurse visit for TB test 2)  Dr.  Sheffield Slider first available apt.

## 2010-11-30 NOTE — Miscellaneous (Signed)
Summary: PT/OT order  Clinical Lists Changes   Signed [facility] faxed request and placed in fax box

## 2010-11-30 NOTE — Letter (Signed)
Summary: Results Follow-up Letter  [facility]  [address]   [place], [place] [address]   Phone: [phone]  Fax: [phone]    06/15/2010  [address] [place], [place]  [address]  Dear Ms. [name],   The following are the results of your recent test(s):  Test     Result     Pap Smear    Normal_______  Not Normal_____       Comments: _________________________________________________________ Cholesterol LDL(Bad cholesterol):          Your goal is less than:         HDL (Good cholesterol):        Your goal is more than: _________________________________________________________ Other Tests:   _________________________________________________________  Please call for an appointment Or _________________________________________________________ _________________________________________________________ _________________________________________________________  Sincerely,  Zachery Dauer MD [facility]

## 2010-11-30 NOTE — Progress Notes (Signed)
Summary: triage back pain and hallucinations  Phone Note Call from Patient Call back at Home Phone [phone]   Caller: Daughter-in-law Alysiah Suppa Summary of Call: Pt's back is hurting seen last week and is no better. Initial call taken by: Clydell Hakim,  April 08, 2010 8:54 AM  Follow-up for Phone Call        back pain unrelieved by narcotic pain meds.she is an assistd living. took muscle relaxer & "she is seeing things" per dtr in law. told her I will ask for an order to d/c that med & will fax it to [facility]. Baclofen was started recently made appt fri with s. Saxon Follow-up by: Golden Circle RN,  April 08, 2010 10:05 AM

## 2010-11-30 NOTE — Assessment & Plan Note (Signed)
Summary: back pain./[place]/hale   Vital Signs:  Patient profile:   77 year old female Height:      62.5 inches Weight:      125 pounds BMI:     22.58 Temp:     98.0 degrees F oral Pulse rate:   101 / minute BP sitting:   138 / 69  (right arm) Cuff size:   regular  Vitals Entered By: Tessie Fass CMA (April 09, 2010 10:24 AM) CC: back pain x 3 weeks Is Patient Diabetic? No Pain Assessment Patient in pain? yes     Location: back   Primary Care Provider:  Zachery Dauer MD  CC:  back pain x 3 weeks.  History of Present Illness: Severe back pain for 3 weeks, started on Baclofen and long acting oxycontin (had been on oxycodone/APAP already).  She became confused, "I lost my mind", had hallucinations.  Pain is helped by the pain pills but for only a short time.  Pain starts in her spine and radiates around her stomach.  Denies falls or trauma.  Has been enjoying her new living situation up until this pain started.  Current Medications (verified): 1)  Levothroid 75 Mcg Tabs (Levothyroxine Sodium) .... Take 1 Tablet By Mouth Once A Day At Bedtime 2)  Boniva 3 Mg/70ml  Kit (Ibandronate Sodium) .... Inject Every 3 Months 3)  Furosemide 40 Mg  Tabs (Furosemide) .... Take One in A.m. 4)  Isosorbide Mononitrate Cr 30 Mg Xr24h-Tab (Isosorbide Mononitrate) .... Take One Tablet Daily 5)  Plavix 75 Mg Tabs (Clopidogrel Bisulfate) .... Take 1 Tablet By Mouth Once A Day 6)  Potassium Chloride Crys Cr 20 Meq Tbcr (Potassium Chloride Crys Cr) .... Take 1 Tablet By Mouth Once A Day 7)  Simvastatin 20 Mg Tabs (Simvastatin) .... Take 1 Tablet By Mouth Every Night 8)  Sm Calcium/vitamin D 500-200 Mg-Unit Tabs (Calcium Carbonate-Vitamin D) .... Take 1 Tablet By Mouth Twice A Day 9)  Zyrtec Allergy 10 Mg Tabs (Cetirizine Hcl) .Marland Kitchen.. 1 Tablet By Mouth Once A Day 10)  Nitroglycerin 0.4 Mg Subl (Nitroglycerin) .... Take 1 Tab Sl As Needed Chest Pain 11)  Ventolin Hfa 108 (90 Base) Mcg/act Aers (Albuterol Sulfate)  .... Take 2 Puffs 4 Times Daily As Needed 12)  Folic Acid 1 Mg  Tabs (Folic Acid) .... One Dialy 13)  Toprol Xl 25 Mg Xr24h-Tab (Metoprolol Succinate) .... Take 2 Tabs By Mouth Daily 14)  Premarin 0.625 Mg/gm Crea (Estrogens, Conjugated) .... Apply Daily Vaginally 15)  Ferrous Sulfate 324 Mg Tbec (Ferrous Sulfate) .... Take One Tablet Daily 16)  Biofreeze Topical .... Apply Three Times A Day 17)  Anusol-Hc 2.5 % Crea (Hydrocortisone) .... Apply Two Times A Day 18)  Baby Aspirin 81 Mg Chew (Aspirin) .... Take One Tablet Daily 19)  Vitamin D 1000 Unit Tabs (Cholecalciferol) .... Take One Tablet Daily 20)  Nexium 40 Mg Cpdr (Esomeprazole Magnesium) .... One Daily 21)  Clotrimazole-Betamethasone 1-0.05 % Crea (Clotrimazole-Betamethasone) .... Apply To Bottom Two Times A Day For 10 Days, 30 Gm Tube 22)  Oxycodone-Acetaminophen 5-325 Mg Tabs (Oxycodone-Acetaminophen) .Marland Kitchen.. 1 Tab By Mouth Q 4 Hours As Needed Pain 23)  Oxycontin 20 Mg Xr12h-Tab (Oxycodone Hcl) .... One Tab Every 12 Hours ( May Use 2 of The 10 Mg Until Used Up Then [employer]) 24)  Calcitonin (Salmon) 200 Unit/act Soln (Calcitonin (Salmon)) .... One Squirt in One Nostril Daily, Alternate Nostrils  Allergies (verified): 1)  ! * Sinemet 2)  Bactrim  Ds (Sulfamethoxazole-Trimethoprim) 3)  Gabapentin (Gabapentin) 4)  Codeine 5)  Effexor Xr (Venlafaxine Hcl) 6)  Cymbalta (Duloxetine Hcl) 7)  Lipitor (Atorvastatin Calcium)  Physical Exam  General:  More frail than usual, alert and oriented Lungs:  normal respiratory effort and normal breath sounds.   Heart:  normal rate and regular rhythm.   Msk:  boney point tenderness around lower thoracic upper lumbar at about 3 levels.  Gait is forward leaning more than usual and she is requiring a walker, had been using a cane. Neurologic:  Muscle strenth in LE symmetrical 4/5.   Impression & Recommendations:  Problem # 1:  LOW BACK PAIN SYNDROME, SEVERE (ICD-724.2)  Suspect osteoporetic  fracture, plan to Xray thoracic and lumbar spine, start calcitonin nasal, increase narcotic to 20 mg long acting and 2 short acting for breakthrough pain, recheck next week. The following medications were removed from the medication list:    Baclofen 10 Mg Tabs (Baclofen) .Marland Kitchen... Take 1/2 tab by mouth three times a day x 2 weeks Her updated medication list for this problem includes:    Baby Aspirin 81 Mg Chew (Aspirin) .Marland Kitchen... Take one tablet daily    Oxycodone-acetaminophen 5-325 Mg Tabs (Oxycodone-acetaminophen) .Marland Kitchen... 1 tab by mouth q 4 hours as needed pain    Oxycontin 20 Mg Xr12h-tab (Oxycodone hcl) ..... One tab every 12 hours ( may use 2 of the 10 mg until used up then new script)  Orders: [facility]- Est Level  3 (59563)  Problem # 2:  OSTEOPOROSIS, UNSPECIFIED (ICD-733.00) High risk for spontaneous vetebral fracture, receiving max medical treatment for such. Her updated medication list for this problem includes:    Boniva 3 Mg/18ml Kit (Ibandronate sodium) ..... Inject every 3 months    Sm Calcium/vitamin D 500-200 Mg-unit Tabs (Calcium carbonate-vitamin d) .Marland Kitchen... Take 1 tablet by mouth twice a day    Vitamin D 1000 Unit Tabs (Cholecalciferol) .Marland Kitchen... Take one tablet daily    Calcitonin (salmon) 200 Unit/act Soln (Calcitonin (salmon)) ..... One squirt in one nostril daily, alternate nostrils  Orders: Radiology other (Radiology Other) [facility]- Est Level  3 (87564)  Complete Medication List: 1)  Levothroid 75 Mcg Tabs (Levothyroxine sodium) .... Take 1 tablet by mouth once a day at bedtime 2)  Boniva 3 Mg/19ml Kit (Ibandronate sodium) .... Inject every 3 months 3)  Furosemide 40 Mg Tabs (Furosemide) .... Take one in a.m. 4)  Isosorbide Mononitrate Cr 30 Mg Xr24h-tab (Isosorbide mononitrate) .... Take one tablet daily 5)  Plavix 75 Mg Tabs (Clopidogrel bisulfate) .... Take 1 tablet by mouth once a day 6)  Potassium Chloride Crys Cr 20 Meq Tbcr (Potassium chloride crys cr) .... Take 1 tablet by mouth  once a day 7)  Simvastatin 20 Mg Tabs (Simvastatin) .... Take 1 tablet by mouth every night 8)  Sm Calcium/vitamin D 500-200 Mg-unit Tabs (Calcium carbonate-vitamin d) .... Take 1 tablet by mouth twice a day 9)  Zyrtec Allergy 10 Mg Tabs (Cetirizine hcl) .Marland Kitchen.. 1 tablet by mouth once a day 10)  Nitroglycerin 0.4 Mg Subl (Nitroglycerin) .... Take 1 tab sl as needed chest pain 11)  Ventolin Hfa 108 (90 Base) Mcg/act Aers (Albuterol sulfate) .... Take 2 puffs 4 times daily as needed 12)  Folic Acid 1 Mg Tabs (Folic acid) .... One dialy 13)  Toprol Xl 25 Mg Xr24h-tab (Metoprolol succinate) .... Take 2 tabs by mouth daily 14)  Premarin 0.625 Mg/gm Crea (Estrogens, conjugated) .... Apply daily vaginally 15)  Ferrous Sulfate 324 Mg  Tbec (Ferrous sulfate) .... Take one tablet daily 16)  Biofreeze Topical  .... Apply three times a day 17)  Anusol-hc 2.5 % Crea (Hydrocortisone) .... Apply two times a day 18)  Baby Aspirin 81 Mg Chew (Aspirin) .... Take one tablet daily 19)  Vitamin D 1000 Unit Tabs (Cholecalciferol) .... Take one tablet daily 20)  Nexium 40 Mg Cpdr (Esomeprazole magnesium) .... One daily 21)  Clotrimazole-betamethasone 1-0.05 % Crea (Clotrimazole-betamethasone) .... Apply to bottom two times a day for 10 days, 30 gm tube 22)  Oxycodone-acetaminophen 5-325 Mg Tabs (Oxycodone-acetaminophen) .Marland Kitchen.. 1 tab by mouth q 4 hours as needed pain 23)  Oxycontin 20 Mg Xr12h-tab (Oxycodone hcl) .... One tab every 12 hours ( may use 2 of the 10 mg until used up then new script) 24)  Calcitonin (salmon) 200 Unit/act Soln (Calcitonin (salmon)) .... One squirt in one nostril daily, alternate nostrils  Other Orders: Vit B12 1000 mcg (U9811)  Patient Instructions: 1)  Increase pain meds Oxycontin to 20 mg two times a day, and still can use oxycdodone/APAP two times a day for break through pain 2)  Nasal spray is also effective in healing the back  3)  Stop Baclofen 4)  Please Xray the  back Prescriptions: OXYCONTIN 20 MG XR12H-TAB (OXYCODONE HCL) one tab every 12 hours ( may use 2 of the 10 mg until used up then new script) Brand medically necessary #60 x 0   Entered and Authorized by:   Luretha Murphy NP   Signed by:   Luretha Murphy NP on 04/09/2010   Method used:   Print then Give to Patient   RxID:   [id number] CALCITONIN (SALMON) 200 UNIT/ACT SOLN (CALCITONIN (SALMON)) one squirt in one nostril daily, alternate nostrils Brand medically necessary #1 x 6   Entered and Authorized by:   Luretha Murphy NP   Signed by:   Luretha Murphy NP on 04/09/2010   Method used:   Print then Give to Patient   RxID:   [id number]    Medication Administration  Injection # 1:    Medication: Vit B12 1000 mcg    Diagnosis: ANEMIA, 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

## 2010-11-30 NOTE — Miscellaneous (Signed)
Summary: Percocet refill  Clinical Lists Changes  Medications: Rx of OXYCODONE-ACETAMINOPHEN 5-325 MG TABS (OXYCODONE-ACETAMINOPHEN) 1 tab by mouth q 4 hours as needed pain;  #90 x 0;  Signed;  Entered by: Zachery Dauer MD;  Authorized by: Zachery Dauer MD;  Method used: Printed then [facility]  [id number] [address] [place], [place], [place]  [address], Ph: [phone], Fax: [phone]    Prescriptions: OXYCODONE-ACETAMINOPHEN 5-325 MG TABS (OXYCODONE-ACETAMINOPHEN) 1 tab by mouth q 4 hours as needed pain  #90 x 0   Entered and Authorized by:   Zachery Dauer MD   Signed by:   Zachery Dauer MD on 04/30/2010   Method used:   Printed then faxed to ...       [facility]  [address] [id number] (retail)       [address]       [place], [place]  [address]       Ph: [phone]       Fax: [phone]   RxID:   [id number]   Appended Document: Percocet refill mailed hard [facility] to [facility]

## 2010-11-30 NOTE — Assessment & Plan Note (Signed)
Summary: F/U VISIT/[facility]   Vital Signs:  Patient profile:   77 year old female Height:      62.5 inches Weight:      130.8 pounds BMI:     23.63 Pulse rate:   82 / minute BP sitting:   128 / 61  (right arm)  Vitals Entered By: Arlyss Repress CMA, (September 30, 2010 10:22 AM) CC: blood work. f/up per dr.Lizabeth Fellner and b12 injection today. Is Patient Diabetic? No Pain Assessment Patient in pain? no        Primary Care Provider:  Zachery Dauer MD  CC:  blood work. f/up per dr.Nikesh Teschner and b12 injection today.Marland Kitchen  History of Present Illness: Exercising every day; "they don't let me sit long," also doing therapy and using bicycle. Socially active with [employer], [employer], etc. No falls.   Back has gotten worse (daughter-in-law says it's better)  ; only taking scheduled medicines, no PRNs, just doesn't ask for them.  Not aware of increased leg swelling. Sleeps on one thick pillow.   Good appetite and requests cutting Ensure down to one can daily.   Still dizzy if moves head suddenly, no nausea, hasn't used Ondansitron.   Tremor isn't as bad-- waxes and wanes.   Due for Vit. B12 shot. Dr Clarene Duke requested labs.   Habits & Providers  Alcohol-Tobacco-Diet     Tobacco Status: quit > 6 months  Current Medications (verified): 1)  Levothroid 75 Mcg Tabs (Levothyroxine Sodium) .... Take 1 Tablet By Mouth Once A Day At Bedtime 2)  Boniva 3 Mg/55ml  Kit (Ibandronate Sodium) .... Inject Every 3 Months 3)  Furosemide 40 Mg  Tabs (Furosemide) .... Take One in A.m. 4)  Isosorbide Mononitrate Cr 30 Mg Xr24h-Tab (Isosorbide Mononitrate) .... Take One Tablet Daily 5)  Plavix 75 Mg Tabs (Clopidogrel Bisulfate) .... Take 1 Tablet By Mouth Once A Day 6)  Potassium Chloride Crys Cr 20 Meq Tbcr (Potassium Chloride Crys Cr) .... Take 1 Tablet By Mouth Once A Day 7)  Simvastatin 20 Mg Tabs (Simvastatin) .... Take 1 Tablet By Mouth Every Night 8)  Sm Calcium/vitamin D 500-200 Mg-Unit Tabs (Calcium Carbonate-Vitamin D)  .... Take 1 Tablet By Mouth Twice A Day 9)  Zyrtec Allergy 10 Mg Tabs (Cetirizine Hcl) .Marland Kitchen.. 1 Tablet By Mouth Once A Day 10)  Nitroglycerin 0.4 Mg Subl (Nitroglycerin) .... Take 1 Tab Sl As Needed Chest Pain 11)  Proair Hfa 108 (90 Base) Mcg/act Aers (Albuterol Sulfate) .... 2 Puffs Every 4 Hour Prn 12)  Folic Acid 1 Mg  Tabs (Folic Acid) .... One Dialy 13)  Toprol Xl 50 Mg Xr24h-Tab (Metoprolol Succinate) .... Take 1 Tab By Mouth Daily 14)  Premarin 0.625 Mg/gm Crea (Estrogens, Conjugated) .... Apply Daily Vaginally 15)  Ferrous Sulfate 324 Mg Tbec (Ferrous Sulfate) .... Take One Tablet Daily 16)  Anusol-Hc 2.5 % Crea (Hydrocortisone) .... Apply Two Times A Day 17)  Baby Aspirin 81 Mg Chew (Aspirin) .... Take One Tablet Daily 18)  Vitamin D 1000 Unit Tabs (Cholecalciferol) .... Take One Tablet Daily 19)  Nexium 40 Mg Cpdr (Esomeprazole Magnesium) .... One Daily 20)  Oxycodone-Acetaminophen 5-325 Mg Tabs (Oxycodone-Acetaminophen) .Marland Kitchen.. 1 Tab By Mouth Three Times A Day. May Have Additional Dose Every 3 Hours Prn 21)  Ensure  Liqd (Nutritional Supplements) .... Drink One Can 1 Hour Before Lunch 22)  Meclizine Hcl 12.5 Mg Tabs (Meclizine Hcl) .... Take One Tab Every 4 Hr As Needed Vertigo. 23)  Acetaminophen 650 Mg Cr-Tabs (  Acetaminophen) .... Take One Tab Daily At Bedtime 24)  Mirtazapine 15 Mg Tabs (Mirtazapine) .... 1/2 Tab At Bedtime (This Patient Resides At [facility] in [place]) 25)  Miralax  Powd (Polyethylene Glycol 3350) .... Take One Pack Daily If Needed For Constipation.  Allergies (verified): 1)  ! * Sinemet 2)  Bactrim Ds (Sulfamethoxazole-Trimethoprim) 3)  Gabapentin (Gabapentin) 4)  Codeine 5)  Effexor Xr (Venlafaxine Hcl) 6)  Cymbalta (Duloxetine Hcl) 7)  Lipitor (Atorvastatin Calcium)  Physical Exam  General:  Frail appearing, using a walker, leaning forward position. Neck:  3-4 cm NVD at 30 degrees. Equivocal HJR Lungs:  Normal respiratory effort,  chest expands symmetrically. Lungs are clear to auscultation, no crackles or wheezes. Heart:  RR,  2/6 HSM left upper sternal border. Psych:  Less depressed affect.  memory intact for recent and remote, good eye contact, and slightly anxious.     Impression & Recommendations:  Problem # 1:  CHF, EJECTION FRACTION > OR = 50% (ICD-428.32) Wt gain could represent fluid retention. Asked her assisted living facility to change her diet to 2 gm Na Her updated medication list for this problem includes:    Furosemide 40 Mg Tabs (Furosemide) .Marland Kitchen... Take one in a.m.    Plavix 75 Mg Tabs (Clopidogrel bisulfate) .Marland Kitchen... Take 1 tablet by mouth once a day    Toprol Xl 50 Mg Xr24h-tab (Metoprolol succinate) .Marland Kitchen... Take 1 tab by mouth daily    Baby Aspirin 81 Mg Chew (Aspirin) .Marland Kitchen... Take one tablet daily  Problem # 2:  HYPERTENSION, BENIGN SYSTEMIC (ICD-401.1) Assessment: Unchanged Changed Toprol XL to one 50 instead of 2 - 25 mg. Her updated medication list for this problem includes:    Furosemide 40 Mg Tabs (Furosemide) .Marland Kitchen... Take one in a.m.    Toprol Xl 50 Mg Xr24h-tab (Metoprolol succinate) .Marland Kitchen... Take 1 tab by mouth daily  Orders: Basic Met-[facility] (04540-98119) [facility]- Est  Level 4 (14782)  Problem # 3:  ADJUSTMENT DISORDER WITHOUT DEPRESSED MOOD (ICD-309.9) Assessment: Improved  Problem # 4:  SPINAL STENOSIS OF LUMBAR REGION (ICD-724.02) Adequately controlled. Orders: [facility]- Est  Level 4 (95621)  Problem # 5:  PERNICIOUS ANEMIA (ICD-281.0) B12 shot.  Her updated medication list for this problem includes:    Folic Acid 1 Mg Tabs (Folic acid) ..... One dialy    Ferrous Sulfate 324 Mg Tbec (Ferrous sulfate) .Marland Kitchen... Take one tablet daily  Orders: CBC-[facility] (30865)  Complete Medication List: 1)  Levothroid 75 Mcg Tabs (Levothyroxine sodium) .... Take 1 tablet by mouth once a day at bedtime 2)  Boniva 3 Mg/33ml Kit (Ibandronate sodium) .... Inject every 3 months 3)  Furosemide 40 Mg Tabs (Furosemide)  .... Take one in a.m. 4)  Isosorbide Mononitrate Cr 30 Mg Xr24h-tab (Isosorbide mononitrate) .... Take one tablet daily 5)  Plavix 75 Mg Tabs (Clopidogrel bisulfate) .... Take 1 tablet by mouth once a day 6)  Potassium Chloride Crys Cr 20 Meq Tbcr (Potassium chloride crys cr) .... Take 1 tablet by mouth once a day 7)  Simvastatin 20 Mg Tabs (Simvastatin) .... Take 1 tablet by mouth every night 8)  Sm Calcium/vitamin D 500-200 Mg-unit Tabs (Calcium carbonate-vitamin d) .... Take 1 tablet by mouth twice a day 9)  Zyrtec Allergy 10 Mg Tabs (Cetirizine hcl) .Marland Kitchen.. 1 tablet by mouth once a day 10)  Nitroglycerin 0.4 Mg Subl (Nitroglycerin) .... Take 1 tab sl as needed chest pain 11)  Proair Hfa 108 (90 Base) Mcg/act Aers (Albuterol  sulfate) .... 2 puffs every 4 hour prn 12)  Folic Acid 1 Mg Tabs (Folic acid) .... One dialy 13)  Toprol Xl 50 Mg Xr24h-tab (Metoprolol succinate) .... Take 1 tab by mouth daily 14)  Premarin 0.625 Mg/gm Crea (Estrogens, conjugated) .... Apply daily vaginally 15)  Ferrous Sulfate 324 Mg Tbec (Ferrous sulfate) .... Take one tablet daily 16)  Anusol-hc 2.5 % Crea (Hydrocortisone) .... Apply two times a day 17)  Baby Aspirin 81 Mg Chew (Aspirin) .... Take one tablet daily 18)  Vitamin D 1000 Unit Tabs (Cholecalciferol) .... Take one tablet daily 19)  Nexium 40 Mg Cpdr (Esomeprazole magnesium) .... One daily 20)  Oxycodone-acetaminophen 5-325 Mg Tabs (Oxycodone-acetaminophen) .Marland Kitchen.. 1 tab by mouth three times a day. may have additional dose every 3 hours prn 21)  Ensure Liqd (Nutritional supplements) .... Drink one can 1 hour before lunch 22)  Meclizine Hcl 12.5 Mg Tabs (Meclizine hcl) .... Take one tab every 4 hr as needed vertigo. 23)  Acetaminophen 650 Mg Cr-tabs (Acetaminophen) .... Take one tab daily at bedtime 24)  Mirtazapine 15 Mg Tabs (Mirtazapine) .... 1/2 tab at bedtime (this patient resides at [facility] in [place]) 25)  Miralax Powd  (Polyethylene glycol 3350) .... Take one pack daily if needed for constipation.  Other Orders: Direct LDL-[facility] [phone]) TSH-[facility] [phone]) Vit B12 1000 mcg (G9562)  Patient Instructions: 1)  Please follow up in 6 weeks; come sooner if you have increased leg swelling or shortness of breath lying down. 2)  Limit your Sodium(salt) to less than 2 grams a day (slightly less than 1/2 teaspoon) to prevent fluid retention, swelling, or worsening of symptoms .  Prescriptions: TOPROL XL 50 MG XR24H-TAB (METOPROLOL SUCCINATE) Take 1 tab by mouth daily  #30 x 11   Entered and Authorized by:   Zachery Dauer MD   Signed by:   Zachery Dauer MD on 09/30/2010   Method used:   Print then Give to Patient   RxID:   [id number] TOPROL XL 50 MG XR24H-TAB (METOPROLOL SUCCINATE) Take 1 tab by mouth daily  #30 x 11   Entered and Authorized by:   Zachery Dauer MD   Signed by:   Zachery Dauer MD on 09/30/2010   Method used:   Electronically to        [facility]  [address] [id number] (retail)       [address]       [place], [place]  [address]       Ph: [phone]       Fax: [phone]   RxID:   [id number]    Medication Administration  Injection # 1:    Medication: Vit B12 1000 mcg    Diagnosis: ANEMIA, 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)    Route: IM    Site: L deltoid    Exp Date: 05/31/2012    Lot #: [id number]    Mfr: [employer]    Patient tolerated injection without complications    Given by: Arlyss Repress CMA, (September 30, 2010 11:27 AM)  Orders Added: 1)  CBC-[facility] [85027] 2)  Direct LDL-[facility] [60109-32355] 3)  TSH-[facility] [73220-25427] 4)  Basic Met-[facility] [06237-62831] 5)  [facility]- Est  Level 4 [51761] 6)  Vit B12 1000 mcg [J3420]      Medication Administration  Injection # 1:    Medication: Vit B12 1000 mcg    Diagnosis: ANEMIA, SECONDARY TO CHRONIC  BLOOD LOSS (ICD-280.0)    Route: IM    Site: L deltoid    Exp Date: 05/31/2012    Lot #:  [id number]    Mfr: [employer]    Patient tolerated injection without complications    Given by: Arlyss Repress CMA, (September 30, 2010 11:27 AM)  Orders Added: 1)  CBC-[facility] [85027] 2)  Direct LDL-[facility] [45409-81191] 3)  TSH-[facility] [47829-56213] 4)  Basic Met-[facility] [08657-84696] 5)  [facility]- Est  Level 4 [29528] 6)  Vit B12 1000 mcg [J3420]    Prevention & Chronic Care Immunizations   Influenza vaccine: Historical  (07/01/2010)   Influenza vaccine due: 07/21/2009    Tetanus booster: 12/29/2004: Done.   Tetanus booster due: 12/30/2014    Pneumococcal vaccine: Done.  (09/30/1992)   Pneumococcal vaccine deferral: Not indicated  (06/25/2009)   Pneumococcal vaccine due: None    H. zoster vaccine: 02/19/2008: given  Colorectal Screening   Hemoccult: Done.  (12/30/2002)   Hemoccult due: Not Indicated    Colonoscopy: Done.  (08/31/2006)   Colonoscopy due: 08/31/2016  Other Screening   Pap smear: Done.  (07/31/1993)   Pap smear due: Not Indicated    Mammogram: normal  (07/01/2008)   Mammogram action/deferral: Not indicated  (02/05/2010)   Mammogram due: 07/01/2009    DXA bone density scan: Done.  (05/31/2006)   DXA bone density action/deferral: Not indicated  (06/25/2009)   DXA scan due: None    Smoking status: quit > 6 months  (09/30/2010)  Lipids   Total Cholesterol: 135  (06/20/2008)   LDL: 50  (06/20/2008)   LDL Direct: 51  (10/06/2009)   HDL: 55  (06/20/2008)   Triglycerides: 149  (06/20/2008)    SGOT (AST): 17  (06/14/2010)   SGPT (ALT): <8 U/L  (06/14/2010)   Alkaline phosphatase: 57  (06/14/2010)   Total bilirubin: 0.5  (06/14/2010)  Hypertension   Last Blood Pressure: 128 / 61  (09/30/2010)   Serum creatinine: 0.92  (06/14/2010)   Serum potassium 4.9  (06/14/2010)    Hypertension flowsheet reviewed?: Yes   Progress toward BP goal: At goal  Self-Management Support :   Personal Goals (by the next clinic visit) :      Personal blood pressure goal:  140/90  (06/25/2009)     Personal LDL goal: 70  (06/25/2009)    Hypertension self-management support: Not documented    Lipid self-management support: Not documented

## 2010-11-30 NOTE — Progress Notes (Signed)
Summary: Rx Prob  Phone Note Call from Patient Call back at [phone]   Caller: Daughter-Nancy Susette Racer Summary of Call: Pt is out of pain pills.  Needs new rx today.  Daughter had rx and took it over there,but it is not good until the 28th and the ones written yesterday she said to discard because she had the other one.  Now needs new one due to the one she has is not good until the 28th. Initial call taken by: Clydell Hakim,  August 20, 2010 11:58 AM    Prescriptions: OXYCODONE-ACETAMINOPHEN 5-325 MG TABS (OXYCODONE-ACETAMINOPHEN) 1 tab by mouth three times a day. May have additional dose every 3 hours prn  #90 x 0   Entered and Authorized by:   Denny Levy MD   Signed by:   Denny Levy MD on 08/20/2010   Method used:   Print then Give to Patient   RxID:   [id number]

## 2010-11-30 NOTE — Assessment & Plan Note (Signed)
Summary: back pain and pernieal irritation   Vital Signs:  Patient profile:   77 year old female Height:      62.5 inches Weight:      123.4 pounds BMI:     22.29 Temp:     98.0 degrees F oral Pulse rate:   82 / minute BP sitting:   124 / 72  (left arm) Cuff size:   regular  Vitals Entered By: Garen Grams LPN (April 02, 1609 1:43 PM) CC: low back/abd pain with frequent urination Is Patient Diabetic? No Pain Assessment Patient in pain? yes     Location: lower back/abd   Primary Care Provider:  Zachery Dauer MD  CC:  low back/abd pain with frequent urination.  History of Present Illness: Pt is here for multiple problems. 1.  Back pain-  pt has hx of spinal stenosis with MRI findings in 2009.  Pt has been receiving injections for the pain and had one on tuesday of this week.  The shot did not help but it did not seem to make it worse.  Pt states that the pain is in the same area worse with movement, no pinpoint tenderness, does radiate down her hips.  Pt stats though the pain is worse than usual.  Pt was on vicodin but was changed to percocet recently.  Pt states that it does help but she is still needing it every 6 hours.  Pt denies any bowel incontenance but one of her other problems is bladder incontinance.  Denies any trauma or event that caused more pain.  2.  Urinary incontenance-  Pt has had this problem in the [past, seems to be a little worse recentyl have 2-3 accidents daily.  Pt denies any increase frequency or urgency and denies dysuria.  Pt states that the urine does seem to be a little darker.  Pt states she is having back pain as described above but not colicky in nature. No abdominal pain out of the ordinary.  Ptstates that she does have problems with constipation but not more than usual.  Denies fever, chills, nausea, vomiting, diarrhea SOB or CP     Habits & Providers  Alcohol-Tobacco-Diet     Tobacco Status: never  Current Medications (verified): 1)  Levothroid  75 Mcg Tabs (Levothyroxine Sodium) .... Take 1 Tablet By Mouth Once A Day At Bedtime 2)  Boniva 3 Mg/61ml  Kit (Ibandronate Sodium) .... Inject Every 3 Months 3)  Furosemide 40 Mg  Tabs (Furosemide) .... Take One in A.m. 4)  Hydrocodone-Acetaminophen 5-500 Mg Tabs (Hydrocodone-Acetaminophen) .... Take 1 Tablet By Mouth Every Four Hours 5)  Isosorbide Mononitrate Cr 30 Mg Xr24h-Tab (Isosorbide Mononitrate) .... Take One Tablet Daily 6)  Plavix 75 Mg Tabs (Clopidogrel Bisulfate) .... Take 1 Tablet By Mouth Once A Day 7)  Potassium Chloride Crys Cr 20 Meq Tbcr (Potassium Chloride Crys Cr) .... Take 1 Tablet By Mouth Once A Day 8)  Simvastatin 20 Mg Tabs (Simvastatin) .... Take 1 Tablet By Mouth Every Night 9)  Sm Calcium/vitamin D 500-200 Mg-Unit Tabs (Calcium Carbonate-Vitamin D) .... Take 1 Tablet By Mouth Twice A Day 10)  Zyrtec Allergy 10 Mg Tabs (Cetirizine Hcl) .Marland Kitchen.. 1 Tablet By Mouth Once A Day 11)  Nitroglycerin 0.4 Mg Subl (Nitroglycerin) .... Take 1 Tab Sl As Needed Chest Pain 12)  Ventolin Hfa 108 (90 Base) Mcg/act Aers (Albuterol Sulfate) .... Take 2 Puffs 4 Times Daily As Needed 13)  Folic Acid 1 Mg  Tabs (Folic  Acid) .... One Dialy 14)  Toprol Xl 25 Mg Xr24h-Tab (Metoprolol Succinate) .... Take 2 Tabs By Mouth Daily 15)  Premarin 0.625 Mg/gm Crea (Estrogens, Conjugated) .... Apply Daily Vaginally 16)  Ferrous Sulfate 324 Mg Tbec (Ferrous Sulfate) .... Take One Tablet Daily 17)  Biofreeze Topical .... Apply Three Times A Day 18)  Anusol-Hc 2.5 % Crea (Hydrocortisone) .... Apply Two Times A Day 19)  Baby Aspirin 81 Mg Chew (Aspirin) .... Take One Tablet Daily 20)  Vitamin D 1000 Unit Tabs (Cholecalciferol) .... Take One Tablet Daily 21)  Nexium 40 Mg Cpdr (Esomeprazole Magnesium) .... One Daily 22)  Clotrimazole-Betamethasone 1-0.05 % Crea (Clotrimazole-Betamethasone) .... Apply To Bottom Two Times A Day For 10 Days, 30 Gm Tube 23)  Oxycodone-Acetaminophen 5-325 Mg Tabs  (Oxycodone-Acetaminophen) .Marland Kitchen.. 1 Tab By Mouth Q 4 Hours As Needed Pain 24)  Oxycontin 10 Mg Xr12h-Tab (Oxycodone Hcl) .... Take 1 Tab By Mouth Two Times A Day 25)  Baclofen 10 Mg Tabs (Baclofen) .... Take 1/2 Tab By Mouth Three Times A Day X 2 Weeks  Allergies (verified): 1)  ! * Sinemet 2)  Bactrim Ds (Sulfamethoxazole-Trimethoprim) 3)  Gabapentin (Gabapentin) 4)  Codeine 5)  Effexor Xr (Venlafaxine Hcl) 6)  Cymbalta (Duloxetine Hcl) 7)  Lipitor (Atorvastatin Calcium)  Past History:  Past medical, surgical, family and social histories (including risk factors) reviewed, and no changes noted (except as noted below).  Past Medical History: Reviewed history from 08/21/2008 and no changes required. 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 August Saucer (recent injection in 9/09 by ?) CT - neck Goiter - 03/02/2000 colonoscopy diffuse diverticulosis & hem - 01/02/2003 Barium Swallow - 06/21/2001 Cardiac Cath - no change - 06/14/2006, Cardiac Cath 8/98 -, Cardiac Cath LAD stent open, RCA stent 80% open - 03/14/2005, Cardiac Cath-stents patent - 09/12/2005, Cardiolite Neg 6/00 -, Cardiolite-inferior wall thinning, EF 89% - 07/01/2004 CT - Abdominal 6 mm RL lung lesion - 12/10/2002 CT - Chest RL lesion unchanged - 08/30/2004 Vit B12 - 12/30/2002, vit B12 normal 1 year without injection - 05/28/2004  MRI - cerebellar atrophy - 07/17/2002 CT - Chest RL lesion unchanged - 06/06/2006 EGD - mild gastritis - 10/17/2006, EGD 7/99, repeat  gastric polyp - 12/01/2001, EGD benign gastric polyp - 12/29/2001 TSH nl - 01/29/2006,  Adenosine myoview low risk - 01/29/2006  Past Surgical History: Reviewed history from 03/20/2007 and no changes required. partial thyroidectomy 75 Angioplasty and stent 9/99  Appendectomy  Cervical laminectomy  Cholecystectomy    D&C 4/00 benign  coronary stent 8/00  L Rotator cuff repair ORIF R hip fracture - 03/16/2000  Splenectomy  Family History: Reviewed history from 06/25/2009 and no changes required.  Htn and heart disease M died age 37  Kidney disease F died age 47 Alcohol complications killed Bro at age 75 Breast CA - daughter died age 106, Sister died age 5 Sister living - age 39, arthritis CAD, DM - son, Dorinda Hill, in [place] CVA - [place], [place], [place] mild PVD - Windy Fast, in [place]  Social History: Reviewed history from 08/21/2008 and no changes required. widowed 1976 Lives with son's family Sons Dorinda Hill and Windy Fast in [place]; Son Chrissie Noa in [place] non-smoker, but uses 1/3 tin of smokeless tobacco daily non-drinker; [employer] faith  Doing some exercises (those given to  her after hip fx) daily  Review of Systems       denies fever, chills, nausea, vomiting, diarrhea please see hpi  Physical Exam  General:  Looked well, NAD Head:  normocephalic and atraumatic, no visible bruising  Eyes:   PERRLA Mouth:  MMM Lungs:  normal respiratory effort and normal breath sounds.   Heart:  normal rate and regular rhythm, low grade murmur.   Abdomen:  BS+, NT, no suprpubic tenderness Genitalia:  inflammed perineum mostly irratation questionable satellite lesions Msk:  left side lumbar paraspinal spasm, no CVA tenderness, no spinous tenderness NVI peripherally Walk with walker   Impression & Recommendations:  Problem # 1:  SPINAL STENOSIS OF LUMBAR REGION (ICD-724.02) Assessment Deteriorated  Pt is to continue getting injections if helping, will allow pt to continue taking percocet as needed but will add oxycontin 10mg  two times a day for hopefully more pain control. Pt may need to be titrated up.  Pt also is having muscle spasm associated this time with exacerbation, see below.  F/u in 2 weeks with Dr. Sheffield Slider.  No red flags today  Orders: [facility]- Est  Level 4 (99214)  Problem # 2:  MUSCLE SPASM, LUMBAR REGION  (ICD-724.8)  will give baclofen for 2 weeks until f/u with dr. Sheffield Slider.  Dr. Sheffield Slider can decide if need to increase dose or stop medication at that time The following medications were removed from the medication list:    Hydrocodone-acetaminophen 5-500 Mg Tabs (Hydrocodone-acetaminophen) .Marland Kitchen... Take 1 tablet by mouth every four hours Her updated medication list for this problem includes:    Baby Aspirin 81 Mg Chew (Aspirin) .Marland Kitchen... Take one tablet daily    Oxycodone-acetaminophen 5-325 Mg Tabs (Oxycodone-acetaminophen) .Marland Kitchen... 1 tab by mouth q 4 hours as needed pain    Oxycontin 10 Mg Xr12h-tab (Oxycodone hcl) .Marland Kitchen... Take 1 tab by mouth two times a day    Baclofen 10 Mg Tabs (Baclofen) .Marland Kitchen... Take 1/2 tab by mouth three times a day x 2 weeks  Orders: [facility]- Est  Level 4 (78295)  Problem # 3:  VULVITIS (ICD-616.10) Assessment: Unchanged will do clotrimtheazole 10 day course again to see if helps.  Pt states improvement last time.  No bleeding.  Problem # 4:  OTHER URINARY INCONTINENCE (ICD-788.39)  pt has atrophy of vaginal skin. no dysuria, UA not the best collection but with no dysuria, no abdominal pain and no cva tenderness no fever suspicion of UTI or cycstitis low.  Will have pt do premarin cream daily.  Dr. Sheffield Slider to readdress at f/u.  Another possible cause could be constipation, may need bowel regimen.  If continue may need catheter urine samlple.  Pt given red flags for UTI   Orders: [facility]- Est  Level 4 (62130)  Complete Medication List: 1)  Levothroid 75 Mcg Tabs (Levothyroxine sodium) .... Take 1 tablet by mouth once a day at bedtime 2)  Boniva 3 Mg/65ml Kit (Ibandronate sodium) .... Inject every 3 months 3)  Furosemide 40 Mg Tabs (Furosemide) .... Take one in a.m. 4)  Isosorbide Mononitrate Cr 30 Mg Xr24h-tab (Isosorbide mononitrate) .... Take one tablet daily 5)  Plavix 75 Mg Tabs (Clopidogrel bisulfate) .... Take 1 tablet by mouth once a day 6)  Potassium Chloride Crys Cr 20 Meq Tbcr  (Potassium chloride crys cr) .... Take 1 tablet by mouth once a day 7)  Simvastatin 20 Mg Tabs (Simvastatin) .... Take 1 tablet by mouth every night 8)  Sm Calcium/vitamin D 500-200 Mg-unit Tabs (Calcium  carbonate-vitamin d) .... Take 1 tablet by mouth twice a day 9)  Zyrtec Allergy 10 Mg Tabs (Cetirizine hcl) .Marland Kitchen.. 1 tablet by mouth once a day 10)  Nitroglycerin 0.4 Mg Subl (Nitroglycerin) .... Take 1 tab sl as needed chest pain 11)  Ventolin Hfa 108 (90 Base) Mcg/act Aers (Albuterol sulfate) .... Take 2 puffs 4 times daily as needed 12)  Folic Acid 1 Mg Tabs (Folic acid) .... One dialy 13)  Toprol Xl 25 Mg Xr24h-tab (Metoprolol succinate) .... Take 2 tabs by mouth daily 14)  Premarin 0.625 Mg/gm Crea (Estrogens, conjugated) .... Apply daily vaginally 15)  Ferrous Sulfate 324 Mg Tbec (Ferrous sulfate) .... Take one tablet daily 16)  Biofreeze Topical  .... Apply three times a day 17)  Anusol-hc 2.5 % Crea (Hydrocortisone) .... Apply two times a day 18)  Baby Aspirin 81 Mg Chew (Aspirin) .... Take one tablet daily 19)  Vitamin D 1000 Unit Tabs (Cholecalciferol) .... Take one tablet daily 20)  Nexium 40 Mg Cpdr (Esomeprazole magnesium) .... One daily 21)  Clotrimazole-betamethasone 1-0.05 % Crea (Clotrimazole-betamethasone) .... Apply to bottom two times a day for 10 days, 30 gm tube 22)  Oxycodone-acetaminophen 5-325 Mg Tabs (Oxycodone-acetaminophen) .Marland Kitchen.. 1 tab by mouth q 4 hours as needed pain 23)  Oxycontin 10 Mg Xr12h-tab (Oxycodone hcl) .... Take 1 tab by mouth two times a day 24)  Baclofen 10 Mg Tabs (Baclofen) .... Take 1/2 tab by mouth three times a day x 2 weeks  Other Orders: Urinalysis-[facility] (00000)  Patient Instructions: 1)  Good to meet you 2)  I want you to take this new medication 2 times daily.  It acts longer 3)  You can still get the percocet if this does not help. 4)  Also I am giving you a medicine called baclofen for the muscle spasm. Take this pill three times a day for  the next 1-2 weeks or until you see Dr. Sheffield Slider.   5)  I want you to use the premarin daily 6)  also for the little yeast you have I want you to use the cream  I am giving you.  You used this in the past Apply to area 1-2 times daily.  7)  Follow up with Dr. Sheffield Slider in 1-2 weeks Prescriptions: CLOTRIMAZOLE-BETAMETHASONE 1-0.05 % CREA (CLOTRIMAZOLE-BETAMETHASONE) apply to bottom two times a day for 10 days, 30 GM tube  #30gm tube x 1   Entered and Authorized by:   Antoine Primas DO   Signed by:   Antoine Primas DO on 04/02/2010   Method used:   Electronically to        [facility]* (retail)       [address]       [place], [place]  [address]       Ph: [phone]       Fax: [phone]   RxID:   [id number] PREMARIN 0.625 MG/GM CREA (ESTROGENS, CONJUGATED) Apply daily vaginally  #1 tube x 3   Entered and Authorized by:   Antoine Primas DO   Signed by:   Antoine Primas DO on 04/02/2010   Method used:   Electronically to        [facility]* (retail)       [address]       [place], [place]  [address]       Ph: [phone]       Fax: [phone]   RxID:   [id number] BACLOFEN 10 MG TABS (BACLOFEN)  take 1/2 tab by mouth three times a day x 2 weeks  #45 x 3   Entered and Authorized by:   Antoine Primas DO   Signed by:   Antoine Primas DO on 04/02/2010   Method used:   Electronically to        [facility]* (retail)       [address]       [place], [place]  [address]       Ph: [phone]       Fax: [phone]   RxID:   [id number] OXYCONTIN 10 MG XR12H-TAB (OXYCODONE HCL) take 1 tab by mouth two times a day  #42 x 0   Entered and Authorized by:   Antoine Primas DO   Signed by:   Antoine Primas DO on 04/02/2010   Method used:   Handwritten   RxID:   [id number]   Laboratory Results   Urine Tests  Date/Time Received: April 02, 2010 1:44 PM  Date/Time Reported: April 02, 2010 1:59 PM   Routine Urinalysis   Color: yellow Appearance: Clear Glucose:  negative   (Normal Range: Negative) Bilirubin: negative   (Normal Range: Negative) Ketone: negative   (Normal Range: Negative) Spec. Gravity: 1.010   (Normal Range: 1.003-1.035) Blood: trace-intact   (Normal Range: Negative) pH: 6.0   (Normal Range: 5.0-8.0) Protein: negative   (Normal Range: Negative) Urobilinogen: 0.2   (Normal Range: 0-1) Nitrite: negative   (Normal Range: Negative) Leukocyte Esterace: large   (Normal Range: Negative)  Urine Microscopic WBC/HPF: >20 RBC/HPF: 0-3 Bacteria/HPF: 3+ Epithelial/HPF: 5-10    Comments: ...........test performed by...........Marland KitchenTerese Door, CMA

## 2010-11-30 NOTE — Miscellaneous (Signed)
Summary: Change to ProAir HFA on formulary  Clinical Lists Changes  Medications: Changed medication from VENTOLIN HFA 108 (90 BASE) MCG/ACT AERS (ALBUTEROL SULFATE) Take 2 puffs 4 times daily as needed to PROAIR HFA 108 (90 BASE) MCG/ACT AERS (ALBUTEROL SULFATE) 2 puffs every 4 hour prn - Signed Rx of PROAIR HFA 108 (90 BASE) MCG/ACT AERS (ALBUTEROL SULFATE) 2 puffs every 4 hour prn;  #1 x 11;  Signed;  Entered by: Zachery Dauer MD;  Authorized by: Zachery Dauer MD;  Method used: Historical    Prescriptions: PROAIR HFA 108 (90 BASE) MCG/ACT AERS (ALBUTEROL SULFATE) 2 puffs every 4 hour prn  #1 x 11   Entered and Authorized by:   Zachery Dauer MD   Signed by:   Zachery Dauer MD on 05/11/2010   Method used:   Historical   RxID:   [id number]

## 2010-11-30 NOTE — Assessment & Plan Note (Signed)
Summary: vicodin refill  Prescriptions: HYDROCODONE-ACETAMINOPHEN 5-500 MG TABS (HYDROCODONE-ACETAMINOPHEN) Take 1 tablet by mouth every four hours  #120 x 2   Entered and Authorized by:   Zachery Dauer MD   Signed by:   Zachery Dauer MD on 02/26/2010   Method used:   Historical   RxID:   [id number]  fax form returned to [facility]  Appended Document: vicodin refill pharmacy does not have Rx for Hydrocodone-acetaminophen. called in verbally.

## 2010-11-30 NOTE — Miscellaneous (Signed)
  Clinical Lists Changes  Medications: Rx of OXYCODONE-ACETAMINOPHEN 5-325 MG TABS (OXYCODONE-ACETAMINOPHEN) 1 tab by mouth three times a day. May have additional dose every 3 hours prn;  #120 x 0;  Signed;  Entered by: Luretha Murphy NP;  Authorized by: Luretha Murphy NP;  Method used: Handwritten  Script was not given as Dr. Jennette Kettle took care of this earlier today. Luretha Murphy NP  August 20, 2010 3:29 PM   Prescriptions: OXYCODONE-ACETAMINOPHEN 5-325 MG TABS (OXYCODONE-ACETAMINOPHEN) 1 tab by mouth three times a day. May have additional dose every 3 hours prn  #120 x 0   Entered and Authorized by:   Luretha Murphy NP   Signed by:   Luretha Murphy NP on 08/20/2010   Method used:   Handwritten   RxID:   [id number]   Appended Document:  driver from [facility]  picked up rx for Oxycodone as written above.  Appended Document:  driver picked up RX written  by Dr.  Jennette Kettle not Luretha Murphy.

## 2010-11-30 NOTE — Progress Notes (Signed)
  Phone Note Outgoing Call   Summary of Call: Was asked by staff to call Gilmer Mor at the nursing facility regarding Ms. Leidner's pain meds as Dr. Sheffield Slider is out this afternoon.  She is in bad back pain from her OA and lumbar stenosis and hydrocodone only helping for 2 hours and only written for every 6.  Will change to percocet q 4.  However they need signed Rx faxed to them at [phone].  I do not currently have access to a fax machine.  Staff is going to ask preceptor to print and fax Rx.  Initial call taken by: Lamar Laundry, MD May 27th, 2011 5:15PM    New/Updated Medications: OXYCODONE-ACETAMINOPHEN 5-325 MG TABS (OXYCODONE-ACETAMINOPHEN) 1 tab by mouth q 4 hours as needed pain Prescriptions: OXYCODONE-ACETAMINOPHEN 5-325 MG TABS (OXYCODONE-ACETAMINOPHEN) 1 tab by mouth q 4 hours as needed pain  #40 x 0   Entered and Authorized by:   Ardeen Garland  MD   Signed by:   Ardeen Garland  MD on 03/26/2010   Method used:   Handwritten   RxID:   [id number]   Appended Document:  Hand wrote Rx for above RX for oxycodone.

## 2010-11-30 NOTE — Miscellaneous (Signed)
Summary: PT form signed  Clinical Lists Changes   PT report signed and placed in fax box to send back to [facility].   Appended Document: PT form signed Repeat form from PT discarded.

## 2010-11-30 NOTE — Assessment & Plan Note (Signed)
Summary: READ PPD/KH  Nurse Visit   Allergies: 1)  ! * Sinemet 2)  Bactrim Ds (Sulfamethoxazole-Trimethoprim) 3)  Gabapentin (Gabapentin) 4)  Codeine 5)  Effexor Xr (Venlafaxine Hcl) 6)  Cymbalta (Duloxetine Hcl) 7)  Lipitor (Atorvastatin Calcium)  PPD Results    Date of reading: 03/03/2010    Results: 0 mm    Interpretation: negative  Orders Added: 1)  No Charge Patient Arrived (NCPA0) [NCPA0]

## 2010-11-30 NOTE — Assessment & Plan Note (Signed)
Summary: FU/KH   Vital Signs:  Patient profile:   77 year old female Height:      62.5 inches Weight:      126 pounds BMI:     22.76 Temp:     97.6 degrees F oral BP sitting:   160 / 78  (left arm) Cuff size:   regular  Vitals Entered By: Tessie Fass CMA (Mar 11, 2010 9:44 AM) CC: F/U Is Patient Diabetic? No Pain Assessment Patient in pain? yes     Location: right foot   Primary Care Provider:  Zachery Dauer MD  CC:  F/U.  History of Present Illness: Unable to tolerate Amatadine or Sinemet because of nausea.  She brough the remaining pills in today.  She is able to cope with her tremor.  She has moved into an AL facility [facility]), she is happy but recongnizes many residents do not have their memories.  She is having problems with her roommate,  and would like to change rooms.  She feels that the facility is willing to work with her on this matter.  Current Medications (verified): 1)  Levothroid 75 Mcg Tabs (Levothyroxine Sodium) .... Take 1 Tablet By Mouth Once A Day At Bedtime 2)  Boniva 3 Mg/73ml  Kit (Ibandronate Sodium) .... Inject Every 3 Months 3)  Furosemide 40 Mg  Tabs (Furosemide) .... Take One in A.m. 4)  Hydrocodone-Acetaminophen 5-500 Mg Tabs (Hydrocodone-Acetaminophen) .... Take 1 Tablet By Mouth Every Four Hours 5)  Isosorbide Mononitrate Cr 30 Mg Xr24h-Tab (Isosorbide Mononitrate) .... Take One Tablet Daily 6)  Plavix 75 Mg Tabs (Clopidogrel Bisulfate) .... Take 1 Tablet By Mouth Once A Day 7)  Potassium Chloride Crys Cr 20 Meq Tbcr (Potassium Chloride Crys Cr) .... Take 1 Tablet By Mouth Once A Day 8)  Simvastatin 20 Mg Tabs (Simvastatin) .... Take 1 Tablet By Mouth Every Night 9)  Sm Calcium/vitamin D 500-200 Mg-Unit Tabs (Calcium Carbonate-Vitamin D) .... Take 1 Tablet By Mouth Twice A Day 10)  Zyrtec Allergy 10 Mg Tabs (Cetirizine Hcl) .Marland Kitchen.. 1 Tablet By Mouth Once A Day 11)  Nitroglycerin 0.4 Mg Subl (Nitroglycerin) .... Take 1 Tab Sl As Needed Chest  Pain 12)  Ventolin Hfa 108 (90 Base) Mcg/act Aers (Albuterol Sulfate) .... Take 2 Puffs 4 Times Daily As Needed 13)  Folic Acid 1 Mg  Tabs (Folic Acid) .... One Dialy 14)  Toprol Xl 25 Mg Xr24h-Tab (Metoprolol Succinate) .... Take 2 Tabs By Mouth Daily 15)  Premarin 0.625 Mg/gm Crea (Estrogens, Conjugated) .... Apply Daily Vaginally 16)  Ferrous Sulfate 324 Mg Tbec (Ferrous Sulfate) .... Take One Tablet Daily 17)  Biofreeze Topical .... Apply Three Times A Day 18)  Anusol-Hc 2.5 % Crea (Hydrocortisone) .... Apply Two Times A Day 19)  Baby Aspirin 81 Mg Chew (Aspirin) .... Take One Tablet Daily 20)  Vitamin D 1000 Unit Tabs (Cholecalciferol) .... Take One Tablet Daily 21)  Nexium 40 Mg Cpdr (Esomeprazole Magnesium) .... One Daily 22)  Clotrimazole-Betamethasone 1-0.05 % Crea (Clotrimazole-Betamethasone) .... Apply To Bottom Two Times A Day For 10 Days, 30 Gm Tube  Allergies (verified): 1)  ! * Sinemet 2)  Bactrim Ds (Sulfamethoxazole-Trimethoprim) 3)  Gabapentin (Gabapentin) 4)  Codeine 5)  Effexor Xr (Venlafaxine Hcl) 6)  Cymbalta (Duloxetine Hcl) 7)  Lipitor (Atorvastatin Calcium)  Physical Exam  General:  Looked well Lungs:  normal respiratory effort and normal breath sounds.   Heart:  normal rate and regular rhythm, low grade murmur.   Extremities:  no edmea   Impression & Recommendations:  Problem # 1:  TREMOR (ICD-781.0)  GI intolerance to both amantadine and Sinemet  Orders: [facility]- Est Level  3 (04540)  Problem # 2:  PERNICIOUS ANEMIA (ICD-281.0)  B12 injection given Her updated medication list for this problem includes:    Folic Acid 1 Mg Tabs (Folic acid) ..... One dialy    Ferrous Sulfate 324 Mg Tbec (Ferrous sulfate) .Marland Kitchen... Take one tablet daily  Orders: [facility]- Est Level  3 (98119)  Problem # 3:  OSTEOPOROSIS, UNSPECIFIED (ICD-733.00)  Receiving IV Boniva q 3 months, denies leg pain Her updated medication list for this problem includes:    Boniva 3 Mg/32ml Kit  (Ibandronate sodium) ..... Inject every 3 months    Sm Calcium/vitamin D 500-200 Mg-unit Tabs (Calcium carbonate-vitamin d) .Marland Kitchen... Take 1 tablet by mouth twice a day    Vitamin D 1000 Unit Tabs (Cholecalciferol) .Marland Kitchen... Take one tablet daily  Orders: [facility]- Est Level  3 (14782)  Complete Medication List: 1)  Levothroid 75 Mcg Tabs (Levothyroxine sodium) .... Take 1 tablet by mouth once a day at bedtime 2)  Boniva 3 Mg/11ml Kit (Ibandronate sodium) .... Inject every 3 months 3)  Furosemide 40 Mg Tabs (Furosemide) .... Take one in a.m. 4)  Hydrocodone-acetaminophen 5-500 Mg Tabs (Hydrocodone-acetaminophen) .... Take 1 tablet by mouth every four hours 5)  Isosorbide Mononitrate Cr 30 Mg Xr24h-tab (Isosorbide mononitrate) .... Take one tablet daily 6)  Plavix 75 Mg Tabs (Clopidogrel bisulfate) .... Take 1 tablet by mouth once a day 7)  Potassium Chloride Crys Cr 20 Meq Tbcr (Potassium chloride crys cr) .... Take 1 tablet by mouth once a day 8)  Simvastatin 20 Mg Tabs (Simvastatin) .... Take 1 tablet by mouth every night 9)  Sm Calcium/vitamin D 500-200 Mg-unit Tabs (Calcium carbonate-vitamin d) .... Take 1 tablet by mouth twice a day 10)  Zyrtec Allergy 10 Mg Tabs (Cetirizine hcl) .Marland Kitchen.. 1 tablet by mouth once a day 11)  Nitroglycerin 0.4 Mg Subl (Nitroglycerin) .... Take 1 tab sl as needed chest pain 12)  Ventolin Hfa 108 (90 Base) Mcg/act Aers (Albuterol sulfate) .... Take 2 puffs 4 times daily as needed 13)  Folic Acid 1 Mg Tabs (Folic acid) .... One dialy 14)  Toprol Xl 25 Mg Xr24h-tab (Metoprolol succinate) .... Take 2 tabs by mouth daily 15)  Premarin 0.625 Mg/gm Crea (Estrogens, conjugated) .... Apply daily vaginally 16)  Ferrous Sulfate 324 Mg Tbec (Ferrous sulfate) .... Take one tablet daily 17)  Biofreeze Topical  .... Apply three times a day 18)  Anusol-hc 2.5 % Crea (Hydrocortisone) .... Apply two times a day 19)  Baby Aspirin 81 Mg Chew (Aspirin) .... Take one tablet daily 20)  Vitamin D  1000 Unit Tabs (Cholecalciferol) .... Take one tablet daily 21)  Nexium 40 Mg Cpdr (Esomeprazole magnesium) .... One daily 22)  Clotrimazole-betamethasone 1-0.05 % Crea (Clotrimazole-betamethasone) .... Apply to bottom two times a day for 10 days, 30 gm tube  Other Orders: Vit B12 1000 mcg (N5621)  Patient Instructions: 1)  Please schedule a follow-up appointment in 1 month in Dr. Martin Majestic clinic so that you are sure to see him.    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

## 2010-11-30 NOTE — Miscellaneous (Signed)
Summary: Admission to AL forms  Clinical Lists Changes-admission to AL  Observations: Added new observation of DM PROGRESS: N/A (03/08/2010 11:33) Added new observation of DM FSREVIEW: N/A (03/08/2010 11:33)  Admission forms for [facility] completed.  FL2 completed and intitial PPD given on 03/01/10.  Faxed to [phone]      Prevention & Chronic Care Immunizations   Influenza vaccine: Fluvax MCR  (08/27/2009)   Influenza vaccine due: 07/21/2009    Tetanus booster: 12/29/2004: Done.   Tetanus booster due: 12/30/2014    Pneumococcal vaccine: Done.  (09/30/1992)   Pneumococcal vaccine deferral: Not indicated  (06/25/2009)   Pneumococcal vaccine due: None    H. zoster vaccine: 02/19/2008: given  Colorectal Screening   Hemoccult: Done.  (12/30/2002)   Hemoccult due: Not Indicated    Colonoscopy: Done.  (08/31/2006)   Colonoscopy due: 08/31/2016  Other Screening   Pap smear: Done.  (07/31/1993)   Pap smear due: Not Indicated    Mammogram: normal  (07/01/2008)   Mammogram action/deferral: Not indicated  (02/05/2010)   Mammogram due: 07/01/2009    DXA bone density scan: Done.  (05/31/2006)   DXA bone density action/deferral: Not indicated  (06/25/2009)   DXA scan due: None    Smoking status: never  (01/14/2010)  Lipids   Total Cholesterol: 135  (06/20/2008)   LDL: 50  (06/20/2008)   LDL Direct: 51  (10/06/2009)   HDL: 55  (06/20/2008)   Triglycerides: 149  (06/20/2008)    SGOT (AST): 20  (10/06/2009)   SGPT (ALT): 9  (10/06/2009)   Alkaline phosphatase: 68  (10/06/2009)   Total bilirubin: 0.4  (10/06/2009)  Hypertension   Last Blood Pressure: 152 / 60  (02/25/2010)   Serum creatinine: 1.11  (10/06/2009)   Serum potassium 4.4  (10/06/2009)  Self-Management Support :   Personal Goals (by the next clinic visit) :      Personal blood pressure goal: 140/90  (06/25/2009)     Personal LDL goal: 70  (06/25/2009)    Hypertension self-management  support: Not documented    Lipid self-management support: Not documented

## 2010-11-30 NOTE — Progress Notes (Signed)
Summary: Triage  Phone Note Call from Patient Call back at Home Phone [phone]   Summary of Call: Pt is requesting to speak with a nurse about her leg. Initial call taken by: Haydee Salter,  November 08, 2007 9:34 AM  Follow-up for Phone Call        c/o pain in r hip & leg. difficulty walking. has appt with ortho in [place] next Tues. wanted to know if she could be seen at [facility] hosp & avoid trip to [place]. taking oxycontin with relief. explained that her doctor here wanted her to see a specialist for this hip & leg pain. urged her to keep that appt. states her family lives with her & is able to take her. she agreed to keep the appt Follow-up by: Golden Circle RN,  November 08, 2007 9:42 AM

## 2010-11-30 NOTE — Assessment & Plan Note (Signed)
Summary: hfu per [place],df   Vital Signs:  Patient profile:   77 year old female Height:      62.5 inches Weight:      123.9 pounds Pulse rate:   78 / minute BP sitting:   128 / 60  (right arm)  Vitals Entered By: Arlyss Repress CMA, (November 03, 2009 2:08 PM) CC: hospital f/up. Is Patient Diabetic? No Pain Assessment Patient in pain? no        Primary Care Provider:  Zachery Dauer MD  CC:  hospital f/up.Marland Kitchen  History of Present Illness: During the fall for which she was recently hospitalized, says she fell into the chair, didn't hit her head and awoke immediately.   Saw the cardiologist.Took the Holter monitor off at their office shortly before this visit  No lunch. Sleeps late Breakfast at 11 AM   Poo appetite, good taste, but poor smell. No nausea, vomiting or diarrhea   Pain right SI joint recently  Current Medications (verified): 1)  Levothroid 75 Mcg Tabs (Levothyroxine Sodium) .... Take 1 Tablet By Mouth Once A Day 2)  Boniva 3 Mg/23ml  Kit (Ibandronate Sodium) .... Inject Every 3 Months 3)  Furosemide 40 Mg  Tabs (Furosemide) .... Take One in A.m. 4)  Hydrocodone-Acetaminophen 5-500 Mg Tabs (Hydrocodone-Acetaminophen) .... Take 1 Tablet By Mouth Every Four Hours 5)  Isosorbide Mononitrate Cr 30 Mg Xr24h-Tab (Isosorbide Mononitrate) .... Take One Tablet Daily 6)  Plavix 75 Mg Tabs (Clopidogrel Bisulfate) .... Take 1 Tablet By Mouth Once A Day 7)  Potassium Chloride Crys Cr 20 Meq Tbcr (Potassium Chloride Crys Cr) .... Take 1 Tablet By Mouth Once A Day 8)  Simvastatin 20 Mg Tabs (Simvastatin) .... Take 1 Tablet By Mouth Every Night 9)  Sm Calcium/vitamin D 500-200 Mg-Unit Tabs (Calcium Carbonate-Vitamin D) .... Take 1 Tablet By Mouth Twice A Day 10)  Zyrtec Allergy 10 Mg Tabs (Cetirizine Hcl) .Marland Kitchen.. 1 Tablet By Mouth Once A Day 11)  Nitroglycerin 0.4 Mg Subl (Nitroglycerin) .... Take 1 Tab Sl As Needed Chest Pain 12)  Ventolin Hfa 108 (90 Base) Mcg/act Aers (Albuterol  Sulfate) .... Take 2 Puffs 4 Times Daily As Needed 13)  Folic Acid 1 Mg  Tabs (Folic Acid) .... One Dialy 14)  Lyrica 50 Mg  Caps (Pregabalin) .... Take 2 Tabs At Bedtime and One During The Night. 15)  Toprol Xl 25 Mg Xr24h-Tab (Metoprolol Succinate) .... Take 1 and 1/2 Tab Daily 16)  Premarin 0.625 Mg/gm Crea (Estrogens, Conjugated) .... Apply Daily Vaginally 17)  Ferrous Sulfate 324 Mg Tbec (Ferrous Sulfate) .... Take One Tablet Daily 18)  Biofreeze Topical .... Apply Three Times A Day 19)  Anusol-Hc 2.5 % Crea (Hydrocortisone) .... Apply Two Times A Day 20)  Baby Aspirin 81 Mg Chew (Aspirin) .... Take One Tablet Daily 21)  Vitamin D 1000 Unit Tabs (Cholecalciferol) .... Take One Tablet Daily 22)  Nexium 40 Mg Cpdr (Esomeprazole Magnesium) .... One Daily 23)  Clotrimazole-Betamethasone 1-0.05 % Crea (Clotrimazole-Betamethasone) .... Apply To Bottom Two Times A Day For 10 Days, 30 Gm Tube  Allergies (verified): 1)  ! * Sinemet 2)  Bactrim Ds (Sulfamethoxazole-Trimethoprim) 3)  Gabapentin (Gabapentin) 4)  Codeine 5)  Effexor Xr (Venlafaxine Hcl) 6)  Cymbalta (Duloxetine Hcl) 7)  Lipitor (Atorvastatin Calcium)  Physical Exam  General:  underweight appearing.   Lungs:  CTAB, no wheezes, rales rhoncii Heart:  RRR, no rubs, gallops, murmurs Msk:  Moves as well as usual without apparent back  pain.  Extremities:  1+ left pedal edema and trace right pedal edema.   Neurologic:  alert, cranial nerves II-XII intact.  Rest tremor left arm without cogwheeling.  Psych:  memory intact for recent and remote, normally interactive, and good eye contact.     Impression & Recommendations:  Problem # 1:  SYNCOPE, HX OF (ICD-V12.49) arrhythmia being rule out  Orders: [facility]- Est  Level 4 (16109)  Problem # 2:  HYPERTENSION, BENIGN SYSTEMIC (ICD-401.1)  Her updated medication list for this problem includes:    Furosemide 40 Mg Tabs (Furosemide) .Marland Kitchen... Take one in a.m.    Toprol Xl 25 Mg Xr24h-tab  (Metoprolol succinate) .Marland Kitchen... Take 1 and 1/2 tab daily  Orders: [facility]- Est  Level 4 (60454)  Problem # 3:  CORONARY, ARTERIOSCLEROSIS (ICD-414.00) stable Her updated medication list for this problem includes:    Furosemide 40 Mg Tabs (Furosemide) .Marland Kitchen... Take one in a.m.    Isosorbide Mononitrate Cr 30 Mg Xr24h-tab (Isosorbide mononitrate) .Marland Kitchen... Take one tablet daily    Plavix 75 Mg Tabs (Clopidogrel bisulfate) .Marland Kitchen... Take 1 tablet by mouth once a day    Nitroglycerin 0.4 Mg Subl (Nitroglycerin) .Marland Kitchen... Take 1 tab sl as needed chest pain    Toprol Xl 25 Mg Xr24h-tab (Metoprolol succinate) .Marland Kitchen... Take 1 and 1/2 tab daily    Baby Aspirin 81 Mg Chew (Aspirin) .Marland Kitchen... Take one tablet daily  Orders: [facility]- Est  Level 4 (99214)  Problem # 4:  PARKINSON'S DISEASE (ICD-332.0) Not progressing Orders: [facility]- Est  Level 4 (09811)  Problem # 5:  SPINAL STENOSIS OF LUMBAR REGION (ICD-724.02) To try a dose of Lyrica during the night if the pain is keeping her awake.  Complete Medication List: 1)  Levothroid 75 Mcg Tabs (Levothyroxine sodium) .... Take 1 tablet by mouth once a day 2)  Boniva 3 Mg/89ml Kit (Ibandronate sodium) .... Inject every 3 months 3)  Furosemide 40 Mg Tabs (Furosemide) .... Take one in a.m. 4)  Hydrocodone-acetaminophen 5-500 Mg Tabs (Hydrocodone-acetaminophen) .... Take 1 tablet by mouth every four hours 5)  Isosorbide Mononitrate Cr 30 Mg Xr24h-tab (Isosorbide mononitrate) .... Take one tablet daily 6)  Plavix 75 Mg Tabs (Clopidogrel bisulfate) .... Take 1 tablet by mouth once a day 7)  Potassium Chloride Crys Cr 20 Meq Tbcr (Potassium chloride crys cr) .... Take 1 tablet by mouth once a day 8)  Simvastatin 20 Mg Tabs (Simvastatin) .... Take 1 tablet by mouth every night 9)  Sm Calcium/vitamin D 500-200 Mg-unit Tabs (Calcium carbonate-vitamin d) .... Take 1 tablet by mouth twice a day 10)  Zyrtec Allergy 10 Mg Tabs (Cetirizine hcl) .Marland Kitchen.. 1 tablet by mouth once a day 11)  Nitroglycerin  0.4 Mg Subl (Nitroglycerin) .... Take 1 tab sl as needed chest pain 12)  Ventolin Hfa 108 (90 Base) Mcg/act Aers (Albuterol sulfate) .... Take 2 puffs 4 times daily as needed 13)  Folic Acid 1 Mg Tabs (Folic acid) .... One dialy 14)  Lyrica 50 Mg Caps (Pregabalin) .... Take 2 tabs at bedtime and one during the night. 15)  Toprol Xl 25 Mg Xr24h-tab (Metoprolol succinate) .... Take 1 and 1/2 tab daily 16)  Premarin 0.625 Mg/gm Crea (Estrogens, conjugated) .... Apply daily vaginally 17)  Ferrous Sulfate 324 Mg Tbec (Ferrous sulfate) .... Take one tablet daily 18)  Biofreeze Topical  .... Apply three times a day 19)  Anusol-hc 2.5 % Crea (Hydrocortisone) .... Apply two times a day 20)  Baby Aspirin 81 Mg  Chew (Aspirin) .... Take one tablet daily 21)  Vitamin D 1000 Unit Tabs (Cholecalciferol) .... Take one tablet daily 22)  Nexium 40 Mg Cpdr (Esomeprazole magnesium) .... One daily 23)  Clotrimazole-betamethasone 1-0.05 % Crea (Clotrimazole-betamethasone) .... Apply to bottom two times a day for 10 days, 30 gm tube  Other Orders: Vit B12 1000 mcg ([id number])  Patient Instructions: 1)  Eat 3 times daily in hope of gaining weight  2)  Increase Lyrica to take a dose during the night if the leg and foot pain wake you up. It will also help appetite.  3)  Please schedule a follow-up appointment in 1 month.  Prescriptions: LYRICA 50 MG  CAPS (PREGABALIN) Take 2 tabs at bedtime and one during the night.  #90 x 3   Entered and Authorized by:   Zachery Dauer MD   Signed by:   Zachery Dauer MD on 11/03/2009   Method used:   Print then Give to Patient   RxID:   [id number] NITROGLYCERIN 0.4 MG SUBL (NITROGLYCERIN) Take 1 tab SL as needed chest pain  #25 x 12   Entered and Authorized by:   Zachery Dauer MD   Signed by:   Zachery Dauer MD on 11/03/2009   Method used:   Electronically to        [facility]  [address] [id number] (retail)       [address]       [place], [place]  [address]        Ph: [phone]       Fax: [phone]   RxID:   [id number]    Medication Administration  Injection # 1:    Medication: Vit B12 1000 mcg    Diagnosis: ANEMIA, 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)    Route: IM    Site: L deltoid    Exp Date: 06/01/2011    Lot #: [id number]    Mfr: [employer]    Patient tolerated injection without complications    Given by: Arlyss Repress CMA, (November 03, 2009 3:35 PM)  Orders Added: 1)  Vit B12 1000 mcg [J3420] 2)  [facility]- Est  Level 4 [33295]   Prevention & Chronic Care Immunizations   Influenza vaccine: Fluvax MCR  (08/27/2009)   Influenza vaccine due: 07/21/2009    Tetanus booster: 12/29/2004: Done.   Tetanus booster due: 12/30/2014    Pneumococcal vaccine: Done.  (09/30/1992)   Pneumococcal vaccine deferral: Not indicated  (06/25/2009)   Pneumococcal vaccine due: None    H. zoster vaccine: 02/19/2008: given  Colorectal Screening   Hemoccult: Done.  (12/30/2002)   Hemoccult due: Not Indicated    Colonoscopy: Done.  (08/31/2006)   Colonoscopy due: 08/31/2016  Other Screening   Pap smear: Done.  (07/31/1993)   Pap smear due: Not Indicated    Mammogram: normal  (07/01/2008)   Mammogram due: 07/01/2009    DXA bone density scan: Done.  (05/31/2006)   DXA bone density action/deferral: Not indicated  (06/25/2009)   DXA scan due: None    Smoking status: never  (10/06/2009)  Lipids   Total Cholesterol: 135  (06/20/2008)   LDL: 50  (06/20/2008)   LDL Direct: 51  (10/06/2009)   HDL: 55  (06/20/2008)   Triglycerides: 149  (06/20/2008)    SGOT (AST): 20  (10/06/2009)   SGPT (ALT): 9  (10/06/2009)   Alkaline phosphatase: 68  (10/06/2009)   Total bilirubin: 0.4  (10/06/2009)  Lipid flowsheet reviewed?: Yes   Progress toward LDL goal: At goal  Hypertension   Last Blood Pressure: 128 / 60  (11/03/2009)   Serum creatinine: 1.11  (10/06/2009)   Serum potassium 4.4  (10/06/2009)    Hypertension flowsheet  reviewed?: Yes   Progress toward BP goal: At goal  Self-Management Support :   Personal Goals (by the next clinic visit) :      Personal blood pressure goal: 140/90  (06/25/2009)     Personal LDL goal: 70  (06/25/2009)    Hypertension self-management support: Not documented    Lipid self-management support: Not documented

## 2010-11-30 NOTE — Assessment & Plan Note (Signed)
Summary: FU tremor, leg pains/KH   Vital Signs:  Patient profile:   77 year old female Height:      62.5 inches Weight:      123 pounds Temp:     98.1 degrees F oral Pulse rate:   101 / minute BP sitting:   140 / 76  (left arm) Cuff size:   regular  Vitals Entered By: Tessie Fass CMA, (December 17, 2009 9:51 AM) CC: F/U Is Patient Diabetic? No Pain Assessment Patient in pain? yes     Location: legs Intensity: 6   Primary Care Provider:  Zachery Dauer MD  CC:  F/U.  History of Present Illness: She continues to have constant discomfort in her legs, mainly cramps. Stopped Lyrica because she thought it was making them worse.   right low back also hurting and plans to make an appointment to have it injected again.   The tremor has become worse making her uncomfortable and having difficulty eating.   Increasingly at odds with her daughter-in-law who she lives with. Her son won't take more than $150/month for her living expenses, but her daughter-in-law demands money for the grandson and for projects around the house and threatens her not to tell the son about it. The daughter-in-law quit smoking and has less illness. Mrs Infantino asks if her $80 SS check would cover expenses at an assisted living facility.   Habits & Providers  Alcohol-Tobacco-Diet     Tobacco Status: quit  Current Medications (verified): 1)  Levothroid 75 Mcg Tabs (Levothyroxine Sodium) .... Take 1 Tablet By Mouth Once A Day At Bedtime 2)  Boniva 3 Mg/42ml  Kit (Ibandronate Sodium) .... Inject Every 3 Months 3)  Furosemide 40 Mg  Tabs (Furosemide) .... Take One in A.m. 4)  Hydrocodone-Acetaminophen 5-500 Mg Tabs (Hydrocodone-Acetaminophen) .... Take 1 Tablet By Mouth Every Four Hours 5)  Isosorbide Mononitrate Cr 30 Mg Xr24h-Tab (Isosorbide Mononitrate) .... Take One Tablet Daily 6)  Plavix 75 Mg Tabs (Clopidogrel Bisulfate) .... Take 1 Tablet By Mouth Once A Day 7)  Potassium Chloride Crys Cr 20 Meq Tbcr  (Potassium Chloride Crys Cr) .... Take 1 Tablet By Mouth Once A Day 8)  Simvastatin 20 Mg Tabs (Simvastatin) .... Take 1 Tablet By Mouth Every Night 9)  Sm Calcium/vitamin D 500-200 Mg-Unit Tabs (Calcium Carbonate-Vitamin D) .... Take 1 Tablet By Mouth Twice A Day 10)  Zyrtec Allergy 10 Mg Tabs (Cetirizine Hcl) .Marland Kitchen.. 1 Tablet By Mouth Once A Day 11)  Nitroglycerin 0.4 Mg Subl (Nitroglycerin) .... Take 1 Tab Sl As Needed Chest Pain 12)  Ventolin Hfa 108 (90 Base) Mcg/act Aers (Albuterol Sulfate) .... Take 2 Puffs 4 Times Daily As Needed 13)  Folic Acid 1 Mg  Tabs (Folic Acid) .... One Dialy 14)  Toprol Xl 25 Mg Xr24h-Tab (Metoprolol Succinate) .... Take 1 and 1/2 Tab Daily 15)  Premarin 0.625 Mg/gm Crea (Estrogens, Conjugated) .... Apply Daily Vaginally 16)  Ferrous Sulfate 324 Mg Tbec (Ferrous Sulfate) .... Take One Tablet Daily 17)  Biofreeze Topical .... Apply Three Times A Day 18)  Anusol-Hc 2.5 % Crea (Hydrocortisone) .... Apply Two Times A Day 19)  Baby Aspirin 81 Mg Chew (Aspirin) .... Take One Tablet Daily 20)  Vitamin D 1000 Unit Tabs (Cholecalciferol) .... Take One Tablet Daily 21)  Nexium 40 Mg Cpdr (Esomeprazole Magnesium) .... One Daily 22)  Clotrimazole-Betamethasone 1-0.05 % Crea (Clotrimazole-Betamethasone) .... Apply To Bottom Two Times A Day For 10 Days, 30 Gm Tube 23)  Ropinirole Hcl 0.25 Mg Tabs (Ropinirole Hcl) .... Take 1 Tab Three Times A Day For 1 Week.then Take 2 Tabs Three Times A Day  Allergies (verified): 1)  ! * Sinemet 2)  Bactrim Ds (Sulfamethoxazole-Trimethoprim) 3)  Gabapentin (Gabapentin) 4)  Codeine 5)  Effexor Xr (Venlafaxine Hcl) 6)  Cymbalta (Duloxetine Hcl) 7)  Lipitor (Atorvastatin Calcium)  Social History: Smoking Status:  quit  Physical Exam  General:  Look well, alert.  Moved well onto exam table. Lungs:  CTAB, no wheezes, rales rhoncii Heart:  regular rhythm and Grade   2/6 systolic ejection murmur.   Msk:  straight leg raise negative for  pain.  Hallux valgus, marked on left. Spacer between toes of right foot.  Pulses:  1+ pedal and post tibial pulses bilterally, skin with good cap refill Extremities:  1+ left pedal edema and trace right pedal edema.   Neurologic:  alert, cranial nerves II-XII intact.  Rest tremor and postural tremor left arm > R without cogwheeling.  Tremor worse will walking. Stance only mildly widened. Short stable steps. Discontinuous turns. Romberg stable.    Impression & Recommendations:  Problem # 1:  PARKINSON'S DISEASE (ICD-332.0) Carefully begin Ropinarole. Adjust dose upwards in 2 weeks when she calls, if she's tolerating it. Warned about sleepiness. Not driving.  Orders: [facility]- Est  Level 4 (38756)  Problem # 2:  HYPERTENSION, BENIGN SYSTEMIC (ICD-401.1) Assessment: Improved  Her updated medication list for this problem includes:    Furosemide 40 Mg Tabs (Furosemide) .Marland Kitchen... Take one in a.m.    Toprol Xl 25 Mg Xr24h-tab (Metoprolol succinate) .Marland Kitchen... Take 1 and 1/2 tab daily  Orders: [facility]- Est  Level 4 (43329)  Problem # 3:  CORONARY, ARTERIOSCLEROSIS (ICD-414.00) No recent chest pain  Her updated medication list for this problem includes:    Furosemide 40 Mg Tabs (Furosemide) .Marland Kitchen... Take one in a.m.    Isosorbide Mononitrate Cr 30 Mg Xr24h-tab (Isosorbide mononitrate) .Marland Kitchen... Take one tablet daily    Plavix 75 Mg Tabs (Clopidogrel bisulfate) .Marland Kitchen... Take 1 tablet by mouth once a day    Nitroglycerin 0.4 Mg Subl (Nitroglycerin) .Marland Kitchen... Take 1 tab sl as needed chest pain    Toprol Xl 25 Mg Xr24h-tab (Metoprolol succinate) .Marland Kitchen... Take 1 and 1/2 tab daily    Baby Aspirin 81 Mg Chew (Aspirin) .Marland Kitchen... Take one tablet daily  Problem # 4:  SPINAL STENOSIS OF LUMBAR REGION (ICD-724.02) Hopefully leg symptoms will be helped by Requip. May need another  Orders: [facility]- Est  Level 4 (51884)  Problem # 5:  GAIT, ABNORMAL (ICD-781.2) Stable using cane  Problem # 6:  PERNICIOUS ANEMIA (ICD-281.0) B12 shot  today Her updated medication list for this problem includes:    Folic Acid 1 Mg Tabs (Folic acid) ..... One dialy    Ferrous Sulfate 324 Mg Tbec (Ferrous sulfate) .Marland Kitchen... Take one tablet daily  Complete Medication List: 1)  Levothroid 75 Mcg Tabs (Levothyroxine sodium) .... Take 1 tablet by mouth once a day at bedtime 2)  Boniva 3 Mg/12ml Kit (Ibandronate sodium) .... Inject every 3 months 3)  Furosemide 40 Mg Tabs (Furosemide) .... Take one in a.m. 4)  Hydrocodone-acetaminophen 5-500 Mg Tabs (Hydrocodone-acetaminophen) .... Take 1 tablet by mouth every four hours 5)  Isosorbide Mononitrate Cr 30 Mg Xr24h-tab (Isosorbide mononitrate) .... Take one tablet daily 6)  Plavix 75 Mg Tabs (Clopidogrel bisulfate) .... Take 1 tablet by mouth once a day 7)  Potassium Chloride Crys Cr 20 Meq Tbcr (Potassium  chloride crys cr) .... Take 1 tablet by mouth once a day 8)  Simvastatin 20 Mg Tabs (Simvastatin) .... Take 1 tablet by mouth every night 9)  Sm Calcium/vitamin D 500-200 Mg-unit Tabs (Calcium carbonate-vitamin d) .... Take 1 tablet by mouth twice a day 10)  Zyrtec Allergy 10 Mg Tabs (Cetirizine hcl) .Marland Kitchen.. 1 tablet by mouth once a day 11)  Nitroglycerin 0.4 Mg Subl (Nitroglycerin) .... Take 1 tab sl as needed chest pain 12)  Ventolin Hfa 108 (90 Base) Mcg/act Aers (Albuterol sulfate) .... Take 2 puffs 4 times daily as needed 13)  Folic Acid 1 Mg Tabs (Folic acid) .... One dialy 14)  Toprol Xl 25 Mg Xr24h-tab (Metoprolol succinate) .... Take 1 and 1/2 tab daily 15)  Premarin 0.625 Mg/gm Crea (Estrogens, conjugated) .... Apply daily vaginally 16)  Ferrous Sulfate 324 Mg Tbec (Ferrous sulfate) .... Take one tablet daily 17)  Biofreeze Topical  .... Apply three times a day 18)  Anusol-hc 2.5 % Crea (Hydrocortisone) .... Apply two times a day 19)  Baby Aspirin 81 Mg Chew (Aspirin) .... Take one tablet daily 20)  Vitamin D 1000 Unit Tabs (Cholecalciferol) .... Take one tablet daily 21)  Nexium 40 Mg Cpdr  (Esomeprazole magnesium) .... One daily 22)  Clotrimazole-betamethasone 1-0.05 % Crea (Clotrimazole-betamethasone) .... Apply to bottom two times a day for 10 days, 30 gm tube 23)  Ropinirole Hcl 0.25 Mg Tabs (Ropinirole hcl) .... Take 1 tab three times a day for 1 week.then take 2 tabs three times a day  Other Orders: Vit B12 1000 mcg (Z6109)  Patient Instructions: 1)  Please schedule a follow-up appointment in 1 month.  2)  You discontinued the Lyrica 3)  You will start Ropinarole three times a day to help the tremor and leg discomfort. You have to build up the dose gradually.  4)  First week take 0.25 mg three times a day, second week  Two tabs (0.5 mg)  three times a day. Then call Dr Sheffield Slider about how you are doing.  Prescriptions: ROPINIROLE HCL 0.25 MG TABS (ROPINIROLE HCL) Take 1 tab three times a day for 1 week.Then take 2 tabs three times a day  #100 x 0   Entered and Authorized by:   Zachery Dauer MD   Signed by:   Zachery Dauer MD on 12/17/2009   Method used:   Electronically to        [facility]  [address] [id number]       [address]       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  Tetanus booster due: 12/30/2014    Pneumococcal vaccine: Done.  (09/30/1992)   Pneumococcal vaccine deferral: Not  indicated  (06/25/2009)   Pneumococcal vaccine due: None    H. zoster vaccine: 02/19/2008: given  Colorectal Screening   Hemoccult: Done.  (12/30/2002)   Hemoccult due: Not Indicated    Colonoscopy: Done.  (08/31/2006)   Colonoscopy due: 08/31/2016  Other Screening   Pap smear: Done.  (07/31/1993)   Pap smear due: Not Indicated    Mammogram: normal  (07/01/2008)   Mammogram due: 07/01/2009    DXA bone density scan: Done.  (05/31/2006)   DXA bone density action/deferral: Not indicated  (06/25/2009)   DXA scan due: None    Smoking status: quit  (12/17/2009)  Lipids   Total Cholesterol: 135  (06/20/2008)   LDL: 50  (06/20/2008)   LDL Direct: 51  (10/06/2009)   HDL: 55  (06/20/2008)   Triglycerides: 149  (06/20/2008)    SGOT (AST): 20  (10/06/2009)   SGPT (ALT): 9  (10/06/2009)   Alkaline phosphatase: 68  (10/06/2009)   Total bilirubin: 0.4  (10/06/2009)    Lipid flowsheet reviewed?: Yes   Progress toward LDL goal: At goal  Hypertension   Last Blood Pressure: 140 / 76  (12/17/2009)   Serum creatinine: 1.11  (10/06/2009)   Serum potassium 4.4  (10/06/2009)    Hypertension flowsheet reviewed?: Yes   Progress toward BP goal: At goal  Self-Management Support :   Personal Goals (by the next clinic visit) :      Personal blood pressure goal: 140/90  (06/25/2009)     Personal LDL goal: 70  (06/25/2009)    Hypertension self-management support: Not documented    Lipid self-management support: Not documented

## 2010-11-30 NOTE — Progress Notes (Signed)
Summary: phnm sg  Phone Note Call from Patient Call back at [phone]   Caller: Nancy-daughter Summary of Call: [facility] called her last night and was told that they were calling in a antibiotic and wants to know if we need this.  Initial call taken by: De Nurse,  June 28, 2010 11:52 AM  Follow-up for Phone Call        Harriett Sine states her mom went to [facility] for stomach pain on 06/22/10. states someone called her mom last night & that they were calling in antibiotics. dtr called the pharmacy & there is no med that was called in. wants to know who at the hospital would have called in and what med is it.  to pcp. told dtr I will call her back when I get more info- Follow-up by: Golden Circle RN,  June 28, 2010 12:03 PM  Additional Follow-up for Phone Call Additional follow up Details #1::        states that the doctor at [facility] called the [facility] and the doc there is getting her the antibiotic - she doesn't need pain meds.  pls destroy Additional Follow-up by: De Nurse,  June 28, 2010 2:28 PM    Additional Follow-up for Phone Call Additional follow up Details #2::    fyi to pcp Follow-up by: Golden Circle RN,  June 28, 2010 2:51 PM  Additional Follow-up for Phone Call Additional follow up Details #3:: Details for Additional Follow-up Action Taken: ER had called recommending antibiotic based on urine culture growing 75K E. Coli. daughter-in-law, Harriett Sine, concerned that an antibiotic will cause complications. I called the patient who complains of her leg pain, but denies dysuria or fever. Based on chronic colonization we will not treat with an antibiotic. She is not getting the Percocet in a timely manner, so I will change the administration to three times a day scheduled and every 3 hour as needed. Will send these orders to [facility] by fax.  Additional Follow-up by: Zachery Dauer MD,  June 29, 2010 10:16 AM  New/Updated Medications: OXYCODONE-ACETAMINOPHEN 5-325 MG TABS  (OXYCODONE-ACETAMINOPHEN) 1 tab by mouth three times a day. May have additional dose every 3 hours prn Prescriptions: OXYCODONE-ACETAMINOPHEN 5-325 MG TABS (OXYCODONE-ACETAMINOPHEN) 1 tab by mouth three times a day. May have additional dose every 3 hours prn  #0 x 0   Entered and Authorized by:   Zachery Dauer MD   Signed by:   Zachery Dauer MD on 06/29/2010   Method used:   Printed then [facility]ed [facility] ...       [facility]  [address] [id number]       [address]       [place], [place]  [address]       Ph: [phone]       Fax: [phone]   RxID:   [id number]

## 2010-11-30 NOTE — Miscellaneous (Signed)
Summary: MAR review  for [facility]  Clinical Lists Changes  MAR for 04/30/10 Still had  Oxycontin 10 that was discontinued at time of hospital discharge, so crossed this and Baclofen,and Calcitonin spray off.  Signed and placed in fax back box.

## 2010-11-30 NOTE — Progress Notes (Signed)
Summary: meds prob  Phone Note Call from Patient Call back at Home Phone [phone]   Caller: Patient Summary of Call: needs to talk to nurse about side affects with Ropinirol HCL 25mg   Initial call taken by: De Nurse,  December 21, 2009 4:37 PM  Follow-up for Phone Call        used for RLS. has had diarrhea & fast heart rate since. took 8 pills then stopped yesterday. wants to speak with pcp about this... Follow-up by: Golden Circle RN,  December 21, 2009 4:48 PM  Additional Follow-up for Phone Call Additional follow up Details #1::        The nausea and diarrhea stopped, but hasn't been able to sleep the past few days. Last Requip dose was Saturday.  She would like to take it because it helped the tremor. She agreed to try one every evening starting tomorrow then gradually increase to three times a day if being tolerated. She will report how she is responding. Additional Follow-up by: Zachery Dauer MD,  December 21, 2009 5:14 PM

## 2010-11-30 NOTE — Miscellaneous (Signed)
Summary: re: pain/TS  Clinical Lists Changes paged Dr.Mayans. pt c/o increased back pain. hydrocodone 5/500 every 6 hrs, not helping. eases pain for only 2 hours.  please call Gilmer Mor @ [phone].  Arlyss Repress CMA,  Mar 26, 2010 5:01 PM

## 2010-11-30 NOTE — Letter (Signed)
Summary: Results Follow-up Letter  [facility]  [address]   [place], [place] [address]   Phone: [phone]  Fax: [phone]    06/15/2010  [address] [place], [place]  [address]  Dear Ms. Knapper,   The following are the results of your recent test(s): atient: [name]  Tests: (1) CBC NO Diff (Complete Blood Count) (10000)   WBC                       8.0 K/uL                    4.0-10.5   RBC                       4.06 MIL/uL                 3.87-5.11   Hemoglobin                12.8 g/dL                   41.3-24.4   Hematocrit                40.2 %                      36.0-46.0   MCV                       99.0 fL                     78.0-100.0 ! MCH                       31.5 pg                     26.0-34.0   MCHC                      31.8 g/dL                   01.0-27.2   RDW                       12.9 %                      11.5-15.5   Platelet Count            237 K/uL                    150-400  Tests: (2) Comprehensive Metabolic Panel (53664)   Sodium                    139 mEq/L                   135-145   Potassium                 4.9 mEq/L                   3.5-5.3   Chloride                  101 mEq/L                   96-112   CO2  27 mEq/L                    19-32   Glucose                   94 mg/dL                    63-87   BUN                       20 mg/dL                    5-64   Creatinine                0.92 mg/dL                  0.40-1.20   Bilirubin, Total          0.5 mg/dL                   3.3-2.9   Alkaline Phosphatase      57 U/L                      39-117   AST/SGOT                  17 U/L                      0-37   ALT/SGPT                  <8 U/L                      0-35   Total Protein             7.2 g/dL                    5.1-8.8   Albumin                   4.6 g/dL                    4.1-6.6   Calcium                   10.0 mg/dL                  0.6-30.1  Tests: (3)  Prealbumin (60109)   Prealbumin                26.6 mg/dL                  32.3-55.7 You are not anemic and not yet malnourished despite your weight loss. If you take the supplements and exercise as much as you can do safely, you will slowly build muscle and stop the weight loss. Document Creation Date: 06/15/2010 12:37 AM _______________________________________________________________________  Sincerely,  Zachery Dauer MD [facility]           Appended Document: Results Follow-up Letter mailed

## 2010-11-30 NOTE — Assessment & Plan Note (Signed)
Summary: f/u back pain, confusionmj   Vital Signs:  Patient profile:   77 year old female Height:      62.5 inches Weight:      124.4 pounds Temp:     98.1 degrees F Pulse rate:   80 / minute BP sitting:   124 / 64  (left arm) Cuff size:   regular  Vitals Entered By: Arlyss Repress CMA, (April 15, 2010 10:15 AM) CC: f/up back pain. feels some better. c/o being forgetful since started oxycontin. Pain Assessment Patient in pain? yes     Location: back Intensity: 8 Onset of pain  Chronic   Primary Care Provider:  Zachery Dauer MD  CC:  f/up back pain. feels some better. c/o being forgetful since started oxycontin.Marland Kitchen  History of Present Illness: The hallucinations stopped after discontinuing the Baclofen, but on the Oxycontin 20 mg she has been intermittently confused. Her daughter-in-law, [name] is concerned that 3 pain medications are available to [name]. Vicodin is still on the assisted living facility [facility] sheet.   [name] reports continued severe pain. She is able to ambulate to the dining area 3 times daily. Is using the nasal calcitonin. Has been taking the oxycodone/acet as needed.   Habits & Providers  Alcohol-Tobacco-Diet     Tobacco Status: quit > 6 months  Current Medications (verified): 1)  Levothroid 75 Mcg Tabs (Levothyroxine Sodium) .... Take 1 Tablet By Mouth Once A Day At Bedtime 2)  Boniva 3 Mg/19ml  Kit (Ibandronate Sodium) .... Inject Every 3 Months 3)  Furosemide 40 Mg  Tabs (Furosemide) .... Take One in A.m. 4)  Isosorbide Mononitrate Cr 30 Mg Xr24h-Tab (Isosorbide Mononitrate) .... Take One Tablet Daily 5)  Plavix 75 Mg Tabs (Clopidogrel Bisulfate) .... Take 1 Tablet By Mouth Once A Day 6)  Potassium Chloride Crys Cr 20 Meq Tbcr (Potassium Chloride Crys Cr) .... Take 1 Tablet By Mouth Once A Day 7)  Simvastatin 20 Mg Tabs (Simvastatin) .... Take 1 Tablet By Mouth Every Night 8)  Sm Calcium/vitamin D 500-200 Mg-Unit Tabs (Calcium Carbonate-Vitamin D) ....  Take 1 Tablet By Mouth Twice A Day 9)  Zyrtec Allergy 10 Mg Tabs (Cetirizine Hcl) .Marland Kitchen.. 1 Tablet By Mouth Once A Day 10)  Nitroglycerin 0.4 Mg Subl (Nitroglycerin) .... Take 1 Tab Sl As Needed Chest Pain 11)  Ventolin Hfa 108 (90 Base) Mcg/act Aers (Albuterol Sulfate) .... Take 2 Puffs 4 Times Daily As Needed 12)  Folic Acid 1 Mg  Tabs (Folic Acid) .... One Dialy 13)  Toprol Xl 25 Mg Xr24h-Tab (Metoprolol Succinate) .... Take 2 Tabs By Mouth Daily 14)  Premarin 0.625 Mg/gm Crea (Estrogens, Conjugated) .... Apply Daily Vaginally 15)  Ferrous Sulfate 324 Mg Tbec (Ferrous Sulfate) .... Take One Tablet Daily 16)  Biofreeze Topical .... Apply Three Times A Day 17)  Anusol-Hc 2.5 % Crea (Hydrocortisone) .... Apply Two Times A Day 18)  Baby Aspirin 81 Mg Chew (Aspirin) .... Take One Tablet Daily 19)  Vitamin D 1000 Unit Tabs (Cholecalciferol) .... Take One Tablet Daily 20)  Nexium 40 Mg Cpdr (Esomeprazole Magnesium) .... One Daily 21)  Oxycodone-Acetaminophen 5-325 Mg Tabs (Oxycodone-Acetaminophen) .Marland Kitchen.. 1 Tab By Mouth Q 4 Hours As Needed Pain 22)  Oxycontin 10 Mg Xr12h-Tab (Oxycodone Hcl) .... Take 1 Tab Q 12 Hours  Allergies (verified): 1)  ! * Sinemet 2)  Bactrim Ds (Sulfamethoxazole-Trimethoprim) 3)  Gabapentin (Gabapentin) 4)  Codeine 5)  Effexor Xr (Venlafaxine Hcl) 6)  Cymbalta (Duloxetine Hcl)  7)  Lipitor (Atorvastatin Calcium)  Social History: Smoking Status:  quit > 6 months  Physical Exam  General:  Dentures out, looks more desheveled. Lungs:  normal respiratory effort and normal breath sounds.   Heart:  normal rate and regular rhythm.   Msk:  Less boney point tenderness around lower thoracic upper lumbar at about 3 levels.  Gait is forward leaning more than usual and she is requiring a walker, had been using a cane.  Neurologic:  alert & oriented X3.  worried expression. Stable wide-based gait with the walker.  Psych:  Less anxious than last week    Impression &  Recommendations:  Problem # 1:  LOW BACK PAIN SYNDROME, SEVERE (ICD-724.2)  xrays were negative for compression fracture  Her updated medication list for this problem includes:    Baby Aspirin 81 Mg Chew (Aspirin) .Marland Kitchen... Take one tablet daily    Oxycodone-acetaminophen 5-325 Mg Tabs (Oxycodone-acetaminophen) .Marland Kitchen... 1 tab by mouth q 4 hours as needed pain    Oxycontin 10 Mg Xr12h-tab (Oxycodone hcl) .Marland Kitchen... Take 1 tab q 12 hours  Orders: [facility]- Est Level  3 (11914)  Problem # 2:  DRUG-INDUCED DELIRIUM (ICD-292.81)  Will decrease Oxycontin from 20 to 10. May wean her off of it.   Orders: [facility]- Est Level  3 ([id number])  Problem # 3:  DYSURIA (ICD-788.1)  Denies fever, chills,  burning, frequency or urgency. Back discomfort is worse with voiding.   Orders: [facility]- Est Level  3 (62130)  Complete Medication List: 1)  Levothroid 75 Mcg Tabs (Levothyroxine sodium) .... Take 1 tablet by mouth once a day at bedtime 2)  Boniva 3 Mg/56ml Kit (Ibandronate sodium) .... Inject every 3 months 3)  Furosemide 40 Mg Tabs (Furosemide) .... Take one in a.m. 4)  Isosorbide Mononitrate Cr 30 Mg Xr24h-tab (Isosorbide mononitrate) .... Take one tablet daily 5)  Plavix 75 Mg Tabs (Clopidogrel bisulfate) .... Take 1 tablet by mouth once a day 6)  Potassium Chloride Crys Cr 20 Meq Tbcr (Potassium chloride crys cr) .... Take 1 tablet by mouth once a day 7)  Simvastatin 20 Mg Tabs (Simvastatin) .... Take 1 tablet by mouth every night 8)  Sm Calcium/vitamin D 500-200 Mg-unit Tabs (Calcium carbonate-vitamin d) .... Take 1 tablet by mouth twice a day 9)  Zyrtec Allergy 10 Mg Tabs (Cetirizine hcl) .Marland Kitchen.. 1 tablet by mouth once a day 10)  Nitroglycerin 0.4 Mg Subl (Nitroglycerin) .... Take 1 tab sl as needed chest pain 11)  Ventolin Hfa 108 (90 Base) Mcg/act Aers (Albuterol sulfate) .... Take 2 puffs 4 times daily as needed 12)  Folic Acid 1 Mg Tabs (Folic acid) .... One dialy 13)  Toprol Xl 25 Mg Xr24h-tab (Metoprolol  succinate) .... Take 2 tabs by mouth daily 14)  Premarin 0.625 Mg/gm Crea (Estrogens, conjugated) .... Apply daily vaginally 15)  Ferrous Sulfate 324 Mg Tbec (Ferrous sulfate) .... Take one tablet daily 16)  Anusol-hc 2.5 % Crea (Hydrocortisone) .... Apply two times a day 17)  Baby Aspirin 81 Mg Chew (Aspirin) .... Take one tablet daily 18)  Vitamin D 1000 Unit Tabs (Cholecalciferol) .... Take one tablet daily 19)  Nexium 40 Mg Cpdr (Esomeprazole magnesium) .... One daily 20)  Oxycodone-acetaminophen 5-325 Mg Tabs (Oxycodone-acetaminophen) .Marland Kitchen.. 1 tab by mouth q 4 hours as needed pain 21)  Oxycontin 10 Mg Xr12h-tab (Oxycodone hcl) .... Take 1 tab q 12 hours  Patient Instructions: 1)  Please schedule a follow-up appointment in 1 month.  2)  Daughter in law, [name], will call in 1 -2 weeks about whether we should further decrease the pain medicine.  3)  Decrease the oxycontin to 10 mg every 12 hours 4)  Discontinue the Calcitonin nasal spray after the current bottle is usd up.     Prevention & Chronic Care Immunizations   Influenza vaccine: Fluvax MCR  (08/27/2009)   Influenza vaccine due: 07/21/2009    Tetanus booster: 12/29/2004: Done.   Tetanus booster due: 12/30/2014    Pneumococcal vaccine: Done.  (09/30/1992)   Pneumococcal vaccine deferral: Not indicated  (06/25/2009)   Pneumococcal vaccine due: None    H. zoster vaccine: 02/19/2008: given  Colorectal Screening   Hemoccult: Done.  (12/30/2002)   Hemoccult due: Not Indicated    Colonoscopy: Done.  (08/31/2006)   Colonoscopy due: 08/31/2016  Other Screening   Pap smear: Done.  (07/31/1993)   Pap smear due: Not Indicated    Mammogram: normal  (07/01/2008)   Mammogram action/deferral: Not indicated  (02/05/2010)   Mammogram due: 07/01/2009    DXA bone density scan: Done.  (05/31/2006)   DXA bone density action/deferral: Not indicated  (06/25/2009)   DXA scan due: None    Smoking status: quit > 6 months   (04/15/2010)  Lipids   Total Cholesterol: 135  (06/20/2008)   LDL: 50  (06/20/2008)   LDL Direct: 51  (10/06/2009)   HDL: 55  (06/20/2008)   Triglycerides: 149  (06/20/2008)    SGOT (AST): 20  (10/06/2009)   SGPT (ALT): 9  (10/06/2009)   Alkaline phosphatase: 68  (10/06/2009)   Total bilirubin: 0.4  (10/06/2009)    Lipid flowsheet reviewed?: Yes   Progress toward LDL goal: At goal  Hypertension   Last Blood Pressure: 124 / 64  (04/15/2010)   Serum creatinine: 1.11  (10/06/2009)   Serum potassium 4.4  (10/06/2009)    Hypertension flowsheet reviewed?: Yes   Progress toward BP goal: At goal  Self-Management Support :   Personal Goals (by the next clinic visit) :      Personal blood pressure goal: 140/90  (06/25/2009)     Personal LDL goal: 70  (06/25/2009)    Hypertension self-management support: Not documented    Lipid self-management support: Not documented

## 2010-11-30 NOTE — Assessment & Plan Note (Signed)
Summary: FU AND BONIVA/KH   Vital Signs:  Patient profile:   77 year old female Weight:      126 pounds Pulse rate:   84 / minute BP sitting:   129 / 70  (right arm)  Vitals Entered By: Arlyss Repress CMA, (November 26, 2009 10:35 AM) CC: Boniva and B12 injection Is Patient Diabetic? No Pain Assessment Patient in pain? yes     Location: back Intensity: 7 Onset of pain  Chronic   Primary Care Provider:  Zachery Dauer MD  CC:  Boniva and B12 injection.  History of Present Illness: Here for IVP Boniva, last creatinine was 1.11.  Has been receiving since 09/2006.  Discussed maximum of 5 year course, as data tells [place] that individuals may be at risk for fractures related to lack of bone turnover.  She deneis any leg pain, she does have a flair of her low back pain.  Low back pain in on the right, radiates down her leg.  It is not severe, and her hydrocodone is effective.  She reports worsening of her left resting tremor and feels that her right hand is starting.  Habits & Providers  Alcohol-Tobacco-Diet     Tobacco Status: never  Current Medications (verified): 1)  Levothroid 75 Mcg Tabs (Levothyroxine Sodium) .... Take 1 Tablet By Mouth Once A Day 2)  Boniva 3 Mg/42ml  Kit (Ibandronate Sodium) .... Inject Every 3 Months 3)  Furosemide 40 Mg  Tabs (Furosemide) .... Take One in A.m. 4)  Hydrocodone-Acetaminophen 5-500 Mg Tabs (Hydrocodone-Acetaminophen) .... Take 1 Tablet By Mouth Every Four Hours 5)  Isosorbide Mononitrate Cr 30 Mg Xr24h-Tab (Isosorbide Mononitrate) .... Take One Tablet Daily 6)  Plavix 75 Mg Tabs (Clopidogrel Bisulfate) .... Take 1 Tablet By Mouth Once A Day 7)  Potassium Chloride Crys Cr 20 Meq Tbcr (Potassium Chloride Crys Cr) .... Take 1 Tablet By Mouth Once A Day 8)  Simvastatin 20 Mg Tabs (Simvastatin) .... Take 1 Tablet By Mouth Every Night 9)  Sm Calcium/vitamin D 500-200 Mg-Unit Tabs (Calcium Carbonate-Vitamin D) .... Take 1 Tablet By Mouth Twice A Day 10)   Zyrtec Allergy 10 Mg Tabs (Cetirizine Hcl) .Marland Kitchen.. 1 Tablet By Mouth Once A Day 11)  Nitroglycerin 0.4 Mg Subl (Nitroglycerin) .... Take 1 Tab Sl As Needed Chest Pain 12)  Ventolin Hfa 108 (90 Base) Mcg/act Aers (Albuterol Sulfate) .... Take 2 Puffs 4 Times Daily As Needed 13)  Folic Acid 1 Mg  Tabs (Folic Acid) .... One Dialy 14)  Lyrica 50 Mg  Caps (Pregabalin) .... Take 2 Tabs At Bedtime and One During The Night. 15)  Toprol Xl 25 Mg Xr24h-Tab (Metoprolol Succinate) .... Take 1 and 1/2 Tab Daily 16)  Premarin 0.625 Mg/gm Crea (Estrogens, Conjugated) .... Apply Daily Vaginally 17)  Ferrous Sulfate 324 Mg Tbec (Ferrous Sulfate) .... Take One Tablet Daily 18)  Biofreeze Topical .... Apply Three Times A Day 19)  Anusol-Hc 2.5 % Crea (Hydrocortisone) .... Apply Two Times A Day 20)  Baby Aspirin 81 Mg Chew (Aspirin) .... Take One Tablet Daily 21)  Vitamin D 1000 Unit Tabs (Cholecalciferol) .... Take One Tablet Daily 22)  Nexium 40 Mg Cpdr (Esomeprazole Magnesium) .... One Daily 23)  Clotrimazole-Betamethasone 1-0.05 % Crea (Clotrimazole-Betamethasone) .... Apply To Bottom Two Times A Day For 10 Days, 30 Gm Tube  Allergies (verified): 1)  ! * Sinemet 2)  Bactrim Ds (Sulfamethoxazole-Trimethoprim) 3)  Gabapentin (Gabapentin) 4)  Codeine 5)  Effexor Xr (Venlafaxine Hcl) 6)  Cymbalta (Duloxetine Hcl) 7)  Lipitor (Atorvastatin Calcium)  Physical Exam  General:  Look well, alert.  Moved well onto exam table. Msk:  straight leg raise negative for pain.   Impression & Recommendations:  Problem # 1:  OSTEOPOROSIS, UNSPECIFIED (ICD-733.00)  3 mg IVP Boniva given, patient counseled on 5 year treatment plan, if she developes pain in her legs she shoud report.  Will need to check renal function at next visit, last checked on month ago.  Patient tolerated the procedule well. Her updated medication list for this problem includes:    Boniva 3 Mg/62ml Kit (Ibandronate sodium) ..... Inject every 3  months    Sm Calcium/vitamin D 500-200 Mg-unit Tabs (Calcium carbonate-vitamin d) .Marland Kitchen... Take 1 tablet by mouth twice a day    Vitamin D 1000 Unit Tabs (Cholecalciferol) .Marland Kitchen... Take one tablet daily  Orders: Provider Misc Charge- [facility] (Misc)  Complete Medication List: 1)  Levothroid 75 Mcg Tabs (Levothyroxine sodium) .... Take 1 tablet by mouth once a day 2)  Boniva 3 Mg/62ml Kit (Ibandronate sodium) .... Inject every 3 months 3)  Furosemide 40 Mg Tabs (Furosemide) .... Take one in a.m. 4)  Hydrocodone-acetaminophen 5-500 Mg Tabs (Hydrocodone-acetaminophen) .... Take 1 tablet by mouth every four hours 5)  Isosorbide Mononitrate Cr 30 Mg Xr24h-tab (Isosorbide mononitrate) .... Take one tablet daily 6)  Plavix 75 Mg Tabs (Clopidogrel bisulfate) .... Take 1 tablet by mouth once a day 7)  Potassium Chloride Crys Cr 20 Meq Tbcr (Potassium chloride crys cr) .... Take 1 tablet by mouth once a day 8)  Simvastatin 20 Mg Tabs (Simvastatin) .... Take 1 tablet by mouth every night 9)  Sm Calcium/vitamin D 500-200 Mg-unit Tabs (Calcium carbonate-vitamin d) .... Take 1 tablet by mouth twice a day 10)  Zyrtec Allergy 10 Mg Tabs (Cetirizine hcl) .Marland Kitchen.. 1 tablet by mouth once a day 11)  Nitroglycerin 0.4 Mg Subl (Nitroglycerin) .... Take 1 tab sl as needed chest pain 12)  Ventolin Hfa 108 (90 Base) Mcg/act Aers (Albuterol sulfate) .... Take 2 puffs 4 times daily as needed 13)  Folic Acid 1 Mg Tabs (Folic acid) .... One dialy 14)  Lyrica 50 Mg Caps (Pregabalin) .... Take 2 tabs at bedtime and one during the night. 15)  Toprol Xl 25 Mg Xr24h-tab (Metoprolol succinate) .... Take 1 and 1/2 tab daily 16)  Premarin 0.625 Mg/gm Crea (Estrogens, conjugated) .... Apply daily vaginally 17)  Ferrous Sulfate 324 Mg Tbec (Ferrous sulfate) .... Take one tablet daily 18)  Biofreeze Topical  .... Apply three times a day 19)  Anusol-hc 2.5 % Crea (Hydrocortisone) .... Apply two times a day 20)  Baby Aspirin 81 Mg Chew (Aspirin)  .... Take one tablet daily 21)  Vitamin D 1000 Unit Tabs (Cholecalciferol) .... Take one tablet daily 22)  Nexium 40 Mg Cpdr (Esomeprazole magnesium) .... One daily 23)  Clotrimazole-betamethasone 1-0.05 % Crea (Clotrimazole-betamethasone) .... Apply to bottom two times a day for 10 days, 30 gm tube  Other Orders: Vit B12 1000 mcg (U0454)  Patient Instructions: 1)  Please schedule a follow-up appointment in 1 month.    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

## 2010-11-30 NOTE — Consult Note (Signed)
Summary: [facility] No Change  [facility]   Imported By: De Nurse 03/19/2010 14:18:45  _____________________________________________________________________  External Attachment:    Type:   Image     Comment:   External Document

## 2010-11-30 NOTE — Progress Notes (Signed)
  Phone Note Call from Patient   Caller: [name] -in law Call For: [phone] Summary of Call: [name] want [name] to call the assisted living facility and give the ok to have [name] stop taking Ensure for breakfast and dinner.  Only want to take once a day because patient feels she's gaining too much weight. Initial call taken by: Abundio Miu,  September 15, 2010 1:50 PM

## 2010-11-30 NOTE — Assessment & Plan Note (Signed)
Summary: Fax back to [facility] about vitamins  I did not sign a request to stop Vit D, Calcium, FeSO4, folic acid, potassium even if Medicaid won't cover them. Placed in the fax back file.

## 2010-11-30 NOTE — Assessment & Plan Note (Signed)
Summary: F/U/KH   Vital Signs:  Patient profile:   77 year old female Height:      62.5 inches Weight:      123 pounds BMI:     22.22 Temp:     97.6 degrees F oral BP sitting:   148 / 75  (left arm) Cuff size:   regular  Vitals Entered By: Tessie Fass CMA (June 24, 2010 10:22 AM) CC: F/U Pain Assessment Patient in pain? yes     Location: right hip Intensity: 8   Primary Care Provider:  Zachery Dauer MD  CC:  F/U.  History of Present Illness: Patient was seen in the ER yesterday after one episode of vomiting.  She has had ongoing nausea for "a while" per her daughter in law.  Appitite has been poor resulting in weight loss.  Work up [employer] with normal pattern, normal labs, urine micro 5-10 WBC hpf.  Does not like her living situation, moved to assisted living a few months ago.  Feels that she is not getting her medications correctly.  Daughter in law has looked into this, and although she may get them late she is getting them.  [name] endorses feeling depressed.  Left hip pain persists, using Percocet three times a day.  Pain in between right 2 and 3 toes, development of a corn per the patient.  Has been using toe separaters that are effetive on the left foot but do not seem to work on the right.  Current Medications (verified): 1)  Levothroid 75 Mcg Tabs (Levothyroxine Sodium) .... Take 1 Tablet By Mouth Once A Day At Bedtime 2)  Boniva 3 Mg/52ml  Kit (Ibandronate Sodium) .... Inject Every 3 Months 3)  Furosemide 40 Mg  Tabs (Furosemide) .... Take One in A.m. 4)  Isosorbide Mononitrate Cr 30 Mg Xr24h-Tab (Isosorbide Mononitrate) .... Take One Tablet Daily 5)  Plavix 75 Mg Tabs (Clopidogrel Bisulfate) .... Take 1 Tablet By Mouth Once A Day 6)  Potassium Chloride Crys Cr 20 Meq Tbcr (Potassium Chloride Crys Cr) .... Take 1 Tablet By Mouth Once A Day 7)  Simvastatin 20 Mg Tabs (Simvastatin) .... Take 1 Tablet By Mouth Every Night 8)  Sm Calcium/vitamin D 500-200 Mg-Unit Tabs  (Calcium Carbonate-Vitamin D) .... Take 1 Tablet By Mouth Twice A Day 9)  Zyrtec Allergy 10 Mg Tabs (Cetirizine Hcl) .Marland Kitchen.. 1 Tablet By Mouth Once A Day 10)  Nitroglycerin 0.4 Mg Subl (Nitroglycerin) .... Take 1 Tab Sl As Needed Chest Pain 11)  Proair Hfa 108 (90 Base) Mcg/act Aers (Albuterol Sulfate) .... 2 Puffs Every 4 Hour Prn 12)  Folic Acid 1 Mg  Tabs (Folic Acid) .... One Dialy 13)  Toprol Xl 25 Mg Xr24h-Tab (Metoprolol Succinate) .... Take 2 Tabs By Mouth Daily 14)  Premarin 0.625 Mg/gm Crea (Estrogens, Conjugated) .... Apply Daily Vaginally 15)  Ferrous Sulfate 324 Mg Tbec (Ferrous Sulfate) .... Take One Tablet Daily 16)  Anusol-Hc 2.5 % Crea (Hydrocortisone) .... Apply Two Times A Day 17)  Baby Aspirin 81 Mg Chew (Aspirin) .... Take One Tablet Daily 18)  Vitamin D 1000 Unit Tabs (Cholecalciferol) .... Take One Tablet Daily 19)  Nexium 40 Mg Cpdr (Esomeprazole Magnesium) .... One Daily 20)  Oxycodone-Acetaminophen 5-325 Mg Tabs (Oxycodone-Acetaminophen) .Marland Kitchen.. 1 Tab By Mouth Q 4 Hours As Needed Pain 21)  Ensure  Liqd (Nutritional Supplements) .... Drink One Can 1 Hour Before Meals 22)  Meclizine Hcl 12.5 Mg Tabs (Meclizine Hcl) .... Take One Tab Every 4  Hr As Needed Vertigo. 23)  Acetaminophen 650 Mg Cr-Tabs (Acetaminophen) .... Take One Tab Daily At Bedtime 24)  Mirtazapine 15 Mg Tabs (Mirtazapine) .... 1/2 Tab Qhs 25)  Mirtazapine 15 Mg Tabs (Mirtazapine) .... 1/2 Tab At Bedtime (This Patient Resides At [facility] in [place])  Allergies (verified): 1)  ! * Sinemet 2)  Bactrim Ds (Sulfamethoxazole-Trimethoprim) 3)  Gabapentin (Gabapentin) 4)  Codeine 5)  Effexor Xr (Venlafaxine Hcl) 6)  Cymbalta (Duloxetine Hcl) 7)  Lipitor (Atorvastatin Calcium)  Review of Systems General:  Complains of loss of appetite and weight loss; denies chills, fever, malaise, and weakness. CV:  Denies chest pain or discomfort; sensation of heart beating fast. GI:  Complains of  nausea and vomiting. GU:  Denies dysuria. MS:  Complains of low back pain. Psych:  Complains of depression; denies anxiety and panic attacks.  Physical Exam  General:  Usual appearing, using a walker, leaning forward position. Lungs:  normal respiratory effort and normal breath sounds.   Heart:  rate 100, 3/6 HSM Abdomen:  soft and non-tender.   Msk:  pain and tender ness over right trochanteric bursa, pain with rotation of right hip Extremities:  hallux valgus causing pressure between 2 and 3 right toes.  very tender over soft corn formation. Psych:  depressed affect.     Impression & Recommendations:  Problem # 1:  LOSS OF WEIGHT (ICD-783.21)  Begin mirtazapine to treat loss of appetite and depression  Orders: [facility]- Est  Level 4 (44010)  Problem # 2:  SPINAL STENOSIS OF LUMBAR REGION (ICD-724.02) chronic pain secondary, treat with three times a day prn Percocet.  Problem # 3:  HYPERTENSION, BENIGN SYSTEMIC (ICD-401.1)  BP back up from low reading weeks ago Her updated medication list for this problem includes:    Furosemide 40 Mg Tabs (Furosemide) .Marland Kitchen... Take one in a.m.    Toprol Xl 25 Mg Xr24h-tab (Metoprolol succinate) .Marland Kitchen... Take 2 tabs by mouth daily  Orders: [facility]- Est  Level 4 (99214)  Problem # 4:  HALLUX VALGUS, ACQUIRED (ICD-735.0) Causing pressure between toes, recommend medicated corn pad.  Problem # 5:  ADJUSTMENT DISORDER WITHOUT DEPRESSED MOOD (ICD-309.9) add antidepressant to treat mood, promote sleep and improve appetitie, mirtazapine. Orders: [facility]- Est  Level 4 (99214)  Complete Medication List: 1)  Levothroid 75 Mcg Tabs (Levothyroxine sodium) .... Take 1 tablet by mouth once a day at bedtime 2)  Boniva 3 Mg/47ml Kit (Ibandronate sodium) .... Inject every 3 months 3)  Furosemide 40 Mg Tabs (Furosemide) .... Take one in a.m. 4)  Isosorbide Mononitrate Cr 30 Mg Xr24h-tab (Isosorbide mononitrate) .... Take one tablet daily 5)  Plavix 75 Mg Tabs  (Clopidogrel bisulfate) .... Take 1 tablet by mouth once a day 6)  Potassium Chloride Crys Cr 20 Meq Tbcr (Potassium chloride crys cr) .... Take 1 tablet by mouth once a day 7)  Simvastatin 20 Mg Tabs (Simvastatin) .... Take 1 tablet by mouth every night 8)  Sm Calcium/vitamin D 500-200 Mg-unit Tabs (Calcium carbonate-vitamin d) .... Take 1 tablet by mouth twice a day 9)  Zyrtec Allergy 10 Mg Tabs (Cetirizine hcl) .Marland Kitchen.. 1 tablet by mouth once a day 10)  Nitroglycerin 0.4 Mg Subl (Nitroglycerin) .... Take 1 tab sl as needed chest pain 11)  Proair Hfa 108 (90 Base) Mcg/act Aers (Albuterol sulfate) .... 2 puffs every 4 hour prn 12)  Folic Acid 1 Mg Tabs (Folic acid) .... One dialy 13)  Toprol Xl 25 Mg Xr24h-tab (  Metoprolol succinate) .... Take 2 tabs by mouth daily 14)  Premarin 0.625 Mg/gm Crea (Estrogens, conjugated) .... Apply daily vaginally 15)  Ferrous Sulfate 324 Mg Tbec (Ferrous sulfate) .... Take one tablet daily 16)  Anusol-hc 2.5 % Crea (Hydrocortisone) .... Apply two times a day 17)  Baby Aspirin 81 Mg Chew (Aspirin) .... Take one tablet daily 18)  Vitamin D 1000 Unit Tabs (Cholecalciferol) .... Take one tablet daily 19)  Nexium 40 Mg Cpdr (Esomeprazole magnesium) .... One daily 20)  Oxycodone-acetaminophen 5-325 Mg Tabs (Oxycodone-acetaminophen) .Marland Kitchen.. 1 tab by mouth q 4 hours as needed pain 21)  Ensure Liqd (Nutritional supplements) .... Drink one can 1 hour before meals 22)  Meclizine Hcl 12.5 Mg Tabs (Meclizine hcl) .... Take one tab every 4 hr as needed vertigo. 23)  Acetaminophen 650 Mg Cr-tabs (Acetaminophen) .... Take one tab daily at bedtime 24)  Mirtazapine 15 Mg Tabs (Mirtazapine) .... 1/2 tab qhs 25)  Mirtazapine 15 Mg Tabs (Mirtazapine) .... 1/2 tab at bedtime (this patient resides at [facility] in [place])  Other Orders: Vit B12 1000 mcg (Z6109)  Patient Instructions: 1)  Please schedule a follow-up appointment in 1 month.  2)  Corn pads with  medicaion 3)  Mirtazapine at bedtime to help with appetite and treat depression Prescriptions: MIRTAZAPINE 15 MG TABS (MIRTAZAPINE) 1/2 tab at bedtime (this patient resides at [facility] in [place])  #30 x 3   Entered by:   Luretha Murphy NP   Authorized by:   Zachery Dauer MD   Signed by:   Luretha Murphy NP on 06/24/2010   Method used:   Electronically to        [facility]* (retail)       [address]       [place], [place]  [address]       Ph: [phone]       Fax: [phone]   RxID:   [id number] MIRTAZAPINE 15 MG TABS (MIRTAZAPINE) 1/2 qhs  #30 x 3   Entered by:   Luretha Murphy NP   Authorized by:   Zachery Dauer MD   Signed by:   Luretha Murphy NP on 06/24/2010   Method used:   Print then Give to Patient   RxID:   [id number] MIRTAZAPINE 15 MG TABS (MIRTAZAPINE) 1/2 tab qhs Brand medically necessary #30 x 3   Entered by:   Luretha Murphy NP   Authorized by:   Zachery Dauer MD   Signed by:   Luretha Murphy NP on 06/24/2010   Method used:   Print then Give to Patient   RxID:   [id number]    Medication Administration  Injection # 1:    Medication: Vit B12 1000 mcg    Diagnosis: PERNICIOUS ANEMIA (ICD-281.0)    Route: IM    Site: R deltoid    Exp Date: 12/02/2011    Lot #: [id number]    Mfr: [employer]    Patient tolerated injection without complications    Given by: Arlyss Repress CMA, (June 24, 2010 10:50 AM)  Orders Added: 1)  Vit B12 1000 mcg [J3420] 2)  [facility]- Est  Level 4 [29518]

## 2010-11-30 NOTE — Progress Notes (Signed)
Summary: triage  Phone Note Call from Patient Call back at [phone]   Caller: Daughter-in-law Mackenzee Becvar Summary of Call: Thinks she has a bladder infection.  Can she be seen today? Initial call taken by: Clydell Hakim,  April 02, 2010 8:54 AM  Follow-up for Phone Call        spoke with dtr in law. c/o back pain. got shot for her back pain at [facility]. burns when she urinates. appt at 1:30 with Dr. Katrinka Blazing. she is aware she will not be seeing her pcp. advised drinking plenty of water & take tyl  Follow-up by: Golden Circle RN,  April 02, 2010 9:01 AM  Additional Follow-up for Phone Call Additional follow up Details #1::        She has had problems with vaginitis in the past to consider if she has external dysuria. If it is internal with frequency and urgency, she'll need a cath urinalysis and C&S, since she has had bladder infections before Additional Follow-up by: Zachery Dauer MD,  April 02, 2010 10:14 AM

## 2010-11-30 NOTE — Miscellaneous (Signed)
Summary: rx for oxycodone/ts  Clinical Lists Changes faxed rx to [facility] .Marland KitchenArlyss Repress CMA,  July 30, 2010 9:28 AM (oxycodone 5-325)

## 2010-11-30 NOTE — Assessment & Plan Note (Signed)
Summary: dizzy/[place]   Vital Signs:  Patient profile:   77 year old female Height:      62.5 inches Weight:      122.7 pounds Pulse rate:   78 / minute BP sitting:   102 / 66  (right arm)  Vitals Entered By: Arlyss Repress CMA, (June 14, 2010 1:39 PM) CC: dizziness off and on x 3 weeks. pt reports 'room spinning'. turned over in bed and dizziness started again Is Patient Diabetic? No Pain Assessment Patient in pain? no        Primary Care Provider:  Zachery Dauer MD  CC:  dizziness off and on x 3 weeks. pt reports 'room spinning'. turned over in bed and dizziness started again.  History of Present Illness: Past month + she has had dizziness described as vision goes dark and momemtarily loses consciousness. Happens most often when standing, but turned in bed at 3 AM and felt like the room going around. Poor appetite but no vomiting or diarrhea.   Heart is fast all the time. No chest pain but gets short of breath. Used her MDI yesterday which helped getting breath.   Back pain is controlled enough to cope with it on current meds. Taking 1 -2 Percocets.   Cramps left neck at times.   Habits & Providers  Alcohol-Tobacco-Diet     Tobacco Status: quit  Current Medications (verified): 1)  Levothroid 75 Mcg Tabs (Levothyroxine Sodium) .... Take 1 Tablet By Mouth Once A Day At Bedtime 2)  Boniva 3 Mg/11ml  Kit (Ibandronate Sodium) .... Inject Every 3 Months 3)  Furosemide 40 Mg  Tabs (Furosemide) .... Take One in A.m. 4)  Isosorbide Mononitrate Cr 30 Mg Xr24h-Tab (Isosorbide Mononitrate) .... Take One Tablet Daily 5)  Plavix 75 Mg Tabs (Clopidogrel Bisulfate) .... Take 1 Tablet By Mouth Once A Day 6)  Potassium Chloride Crys Cr 20 Meq Tbcr (Potassium Chloride Crys Cr) .... Take 1 Tablet By Mouth Once A Day 7)  Simvastatin 20 Mg Tabs (Simvastatin) .... Take 1 Tablet By Mouth Every Night 8)  Sm Calcium/vitamin D 500-200 Mg-Unit Tabs (Calcium Carbonate-Vitamin D) .... Take 1 Tablet By  Mouth Twice A Day 9)  Zyrtec Allergy 10 Mg Tabs (Cetirizine Hcl) .Marland Kitchen.. 1 Tablet By Mouth Once A Day 10)  Nitroglycerin 0.4 Mg Subl (Nitroglycerin) .... Take 1 Tab Sl As Needed Chest Pain 11)  Proair Hfa 108 (90 Base) Mcg/act Aers (Albuterol Sulfate) .... 2 Puffs Every 4 Hour Prn 12)  Folic Acid 1 Mg  Tabs (Folic Acid) .... One Dialy 13)  Toprol Xl 25 Mg Xr24h-Tab (Metoprolol Succinate) .... Take 2 Tabs By Mouth Daily 14)  Premarin 0.625 Mg/gm Crea (Estrogens, Conjugated) .... Apply Daily Vaginally 15)  Ferrous Sulfate 324 Mg Tbec (Ferrous Sulfate) .... Take One Tablet Daily 16)  Anusol-Hc 2.5 % Crea (Hydrocortisone) .... Apply Two Times A Day 17)  Baby Aspirin 81 Mg Chew (Aspirin) .... Take One Tablet Daily 18)  Vitamin D 1000 Unit Tabs (Cholecalciferol) .... Take One Tablet Daily 19)  Nexium 40 Mg Cpdr (Esomeprazole Magnesium) .... One Daily 20)  Oxycodone-Acetaminophen 5-325 Mg Tabs (Oxycodone-Acetaminophen) .Marland Kitchen.. 1 Tab By Mouth Q 4 Hours As Needed Pain 21)  Ensure  Liqd (Nutritional Supplements) .... Drink One Can 1 Hour Before Meals 22)  Meclizine Hcl 12.5 Mg Tabs (Meclizine Hcl) .... Take One Tab Every 4 Hr As Needed Vertigo. 23)  Acetaminophen 650 Mg Cr-Tabs (Acetaminophen) .... Take One Tab Daily At Bedtime  Allergies (  verified): 1)  ! * Sinemet 2)  Bactrim Ds (Sulfamethoxazole-Trimethoprim) 3)  Gabapentin (Gabapentin) 4)  Codeine 5)  Effexor Xr (Venlafaxine Hcl) 6)  Cymbalta (Duloxetine Hcl) 7)  Lipitor (Atorvastatin Calcium)  Social History: Smoking Status:  quit  Physical Exam  General:  Looked well, groomed, and alert.  Using walker. Chest Wall:  Compressed by kyphosis Lungs:  normal respiratory effort and normal breath sounds.   Heart:  Normal rate and regular rhythm. S1 and S2 normal without gallop, murmur, click, rub or other extra sounds, but frequent premature beats. PVC's on EKG Abdomen:  Bowel sounds positive,abdomen soft and non-tender without masses, organomegaly  or hernias noted. Msk:  Very kyphotic and stooped.  Extremities:  1+ left pedal edema and 1+ right pedal edema.   Psych:  normally interactive and good eye contact.  Usual mildly anxious demeanor   Impression & Recommendations:  Problem # 1:  BENIGN POSITIONAL VERTIGO (ICD-386.11) With most of symptoms more likely due to low blood pressure  Her updated medication list for this problem includes:    Zyrtec Allergy 10 Mg Tabs (Cetirizine hcl) .Marland Kitchen... 1 tablet by mouth once a day    Meclizine Hcl 12.5 Mg Tabs (Meclizine hcl) .Marland Kitchen... Take one tab every 4 hr as needed vertigo.  Orders: [facility]- Est  Level 4 (96295)  Problem # 2:  LOSS OF WEIGHT (ICD-783.21) Try supplement of calories since Mirtazepine can cause OH and Megace can worsen CHF Orders: Comp Met-[facility] (28413-24401) Miscellaneous Lab Charge-[facility] (02725) [facility]- Est  Level 4 (36644)  Problem # 3:  HYPERTENSION, BENIGN SYSTEMIC (ICD-401.1) Controlled. Her updated medication list for this problem includes:    Furosemide 40 Mg Tabs (Furosemide) .Marland Kitchen... Take one in a.m.    Toprol Xl 25 Mg Xr24h-tab (Metoprolol succinate) .Marland Kitchen... Take 2 tabs by mouth daily  Problem # 4:  SYNCOPE, HX OF (ICD-V12.49) No complete episode. No arrhythmia on EKG other than Occ PVC Orders: [facility]- Est  Level 4 (03474)  Problem # 5:  DRUG-INDUCED DELIRIUM (ICD-292.81) resolved, but she and daughter-in-law report that her memory is not back to usual.   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 One dialy    Ferrous Sulfate 324 Mg Tbec (Ferrous sulfate) .Marland Kitchen... Take one tablet daily  Orders: CBC-[facility] (25956)  Problem # 7:  LOW BACK PAIN SYNDROME, SEVERE (ICD-724.2) Better controlled, but has to wait for pain med. Awakens her in the early AM. Will add extended release Acetaminophen at bedtime in hopes of better sleep.  Her updated  medication list for this problem includes:    Baby Aspirin 81 Mg Chew (Aspirin) .Marland Kitchen... Take one tablet daily    Oxycodone-acetaminophen 5-325 Mg Tabs (Oxycodone-acetaminophen) .Marland Kitchen... 1 tab by mouth q 4 hours as needed pain    Acetaminophen 650 Mg Cr-tabs (Acetaminophen) .Marland Kitchen... Take one tab daily at bedtime  Complete Medication List: 1)  Levothroid 75 Mcg Tabs (Levothyroxine sodium) .... Take 1 tablet by mouth once a day at bedtime 2)  Boniva 3 Mg/27ml Kit (Ibandronate sodium) .... Inject every 3 months 3)  Furosemide 40 Mg Tabs (Furosemide) .... Take one in a.m. 4)  Isosorbide Mononitrate Cr 30 Mg Xr24h-tab (Isosorbide mononitrate) .... Take one tablet daily 5)  Plavix 75 Mg Tabs (Clopidogrel bisulfate) .... Take 1 tablet by mouth once a day 6)  Potassium Chloride Crys Cr 20  Meq Tbcr (Potassium chloride crys cr) .... Take 1 tablet by mouth once a day 7)  Simvastatin 20 Mg Tabs (Simvastatin) .... Take 1 tablet by mouth every night 8)  Sm Calcium/vitamin D 500-200 Mg-unit Tabs (Calcium carbonate-vitamin d) .... Take 1 tablet by mouth twice a day 9)  Zyrtec Allergy 10 Mg Tabs (Cetirizine hcl) .Marland Kitchen.. 1 tablet by mouth once a day 10)  Nitroglycerin 0.4 Mg Subl (Nitroglycerin) .... Take 1 tab sl as needed chest pain 11)  Proair Hfa 108 (90 Base) Mcg/act Aers (Albuterol sulfate) .... 2 puffs every 4 hour prn 12)  Folic Acid 1 Mg Tabs (Folic acid) .... One dialy 13)  Toprol Xl 25 Mg Xr24h-tab (Metoprolol succinate) .... Take 2 tabs by mouth daily 14)  Premarin 0.625 Mg/gm Crea (Estrogens, conjugated) .... Apply daily vaginally 15)  Ferrous Sulfate 324 Mg Tbec (Ferrous sulfate) .... Take one tablet daily 16)  Anusol-hc 2.5 % Crea (Hydrocortisone) .... Apply two times a day 17)  Baby Aspirin 81 Mg Chew (Aspirin) .... Take one tablet daily 18)  Vitamin D 1000 Unit Tabs (Cholecalciferol) .... Take one tablet daily 19)  Nexium 40 Mg Cpdr (Esomeprazole magnesium) .... One daily 20)  Oxycodone-acetaminophen  5-325 Mg Tabs (Oxycodone-acetaminophen) .Marland Kitchen.. 1 tab by mouth q 4 hours as needed pain 21)  Ensure Liqd (Nutritional supplements) .... Drink one can 1 hour before meals 22)  Meclizine Hcl 12.5 Mg Tabs (Meclizine hcl) .... Take one tab every 4 hr as needed vertigo. 23)  Acetaminophen 650 Mg Cr-tabs (Acetaminophen) .... Take one tab daily at bedtime  Other Orders: 12 Lead EKG (12 Lead EKG)  Patient Instructions: 1)  Drink lots of fluids when it's hot. Stand up slowly and wait a few minutes before starting to walk. Continue to stay active.  2)  Ask for the meclizine vertigo pill is the head spinning lasts more than a few minutes.  3)  Take a can of Ensure 30-60 minutes before meals as an appetizer. Eat a snack at bedtime.  4)  Keep your appointment already scheduled in a couple weeks.  Prescriptions: ACETAMINOPHEN 650 MG CR-TABS (ACETAMINOPHEN) Take one tab daily at bedtime  #30 x 11   Entered and Authorized by:   Zachery Dauer MD   Signed by:   Zachery Dauer MD on 06/14/2010   Method used:   Print then Give to Patient   RxID:   [id number] MECLIZINE HCL 12.5 MG TABS (MECLIZINE HCL) Take one tab every 4 hr as needed vertigo.  #30 x 1   Entered and Authorized by:   Zachery Dauer MD   Signed by:   Zachery Dauer MD on 06/14/2010   Method used:   Print then Give to Patient   RxID:   [id number]     Prevention & Chronic Care Immunizations   Influenza vaccine: Fluvax MCR  (08/27/2009)   Influenza vaccine due: 07/21/2009    Tetanus booster: 12/29/2004: Done.   Tetanus booster due: 12/30/2014    Pneumococcal vaccine: Done.  (09/30/1992)   Pneumococcal vaccine deferral: Not indicated  (06/25/2009)   Pneumococcal vaccine due: None    H. zoster vaccine: 02/19/2008: given  Colorectal Screening   Hemoccult: Done.  (12/30/2002)   Hemoccult due: Not Indicated    Colonoscopy: Done.  (08/31/2006)   Colonoscopy due: 08/31/2016  Other Screening   Pap smear: Done.  (07/31/1993)   Pap smear  due: Not Indicated    Mammogram: normal  (07/01/2008)   Mammogram action/deferral: Not indicated  (  02/05/2010)   Mammogram due: 07/01/2009    DXA bone density scan: Done.  (05/31/2006)   DXA bone density action/deferral: Not indicated  (06/25/2009)   DXA scan due: None    Smoking status: quit  (06/14/2010)  Lipids   Total Cholesterol: 135  (06/20/2008)   LDL: 50  (06/20/2008)   LDL Direct: 51  (10/06/2009)   HDL: 55  (06/20/2008)   Triglycerides: 149  (06/20/2008)    SGOT (AST): 20  (10/06/2009)   SGPT (ALT): 9  (10/06/2009) CMP ordered    Alkaline phosphatase: 68  (10/06/2009)   Total bilirubin: 0.4  (10/06/2009)   Progress toward LDL goal: At goal  Hypertension   Last Blood Pressure: 102 / 66  (06/14/2010)   Serum creatinine: 1.11  (10/06/2009)   Serum potassium 4.4  (10/06/2009) CMP ordered     Hypertension flowsheet reviewed?: Yes   Progress toward BP goal: At goal  Self-Management Support :   Personal Goals (by the next clinic visit) :      Personal blood pressure goal: 140/90  (06/25/2009)     Personal LDL goal: 70  (06/25/2009)    Hypertension self-management support: Not documented    Lipid self-management support: Not documented

## 2010-12-02 NOTE — Assessment & Plan Note (Signed)
Summary: cough/congestion/eo   Vital Signs:  Patient profile:   77 year old female Height:      62.5 inches Weight:      128.9 pounds BMI:     23.28 O2 Sat:      98 % on Room air Temp:     98.1 degrees F oral Pulse rate:   90 / minute BP sitting:   143 / 84  (left arm) Cuff size:   regular  Vitals Entered By: Garen Grams LPN (November 11, 2010 3:47 PM)  O2 Flow:  Room air CC: cough/congestion x 1 wwek Is Patient Diabetic? No Pain Assessment Patient in pain? no        Primary Care Provider:  Zachery Dauer MD  CC:  cough/congestion x 1 wwek.  History of Present Illness: 1. Cough/congestion:  Pt has had this for the past week.  She lives at an ALF and there has been a lot of people that have been sick there.  Some people have had pneumonia and have required to go to the hospital.  She thinks that she has been getting worse.  She is now a little short of breath.    ROS: denies fevers, chills, malaise.  Habits & Providers  Alcohol-Tobacco-Diet     Tobacco Status: quit > 6 months     Year Quit: 1960  Current Medications (verified): 1)  Levothroid 75 Mcg Tabs (Levothyroxine Sodium) .... Take 1 Tablet By Mouth Once A Day At Bedtime 2)  Boniva 3 Mg/44ml  Kit (Ibandronate Sodium) .... Inject Every 3 Months 3)  Furosemide 40 Mg  Tabs (Furosemide) .... Take One in A.m. 4)  Isosorbide Mononitrate Cr 30 Mg Xr24h-Tab (Isosorbide Mononitrate) .... Take One Tablet Daily 5)  Plavix 75 Mg Tabs (Clopidogrel Bisulfate) .... Take 1 Tablet By Mouth Once A Day 6)  Potassium Chloride Crys Cr 20 Meq Tbcr (Potassium Chloride Crys Cr) .... Take 1 Tablet By Mouth Once A Day 7)  Simvastatin 20 Mg Tabs (Simvastatin) .... Take 1 Tablet By Mouth Every Night 8)  Sm Calcium/vitamin D 500-200 Mg-Unit Tabs (Calcium Carbonate-Vitamin D) .... Take 1 Tablet By Mouth Twice A Day 9)  Nitroglycerin 0.4 Mg Subl (Nitroglycerin) .... Take 1 Tab Sl As Needed Chest Pain 10)  Proair Hfa 108 (90 Base) Mcg/act Aers  (Albuterol Sulfate) .... 2 Puffs Every 4 Hour Prn 11)  Folic Acid 1 Mg  Tabs (Folic Acid) .... One Dialy 12)  Toprol Xl 50 Mg Xr24h-Tab (Metoprolol Succinate) .... Take 1 Tab By Mouth Daily 13)  Premarin 0.625 Mg/gm Crea (Estrogens, Conjugated) .... Apply Daily Vaginally 14)  Ferrous Sulfate 324 Mg Tbec (Ferrous Sulfate) .... Take One Tablet Daily 15)  Baby Aspirin 81 Mg Chew (Aspirin) .... Take One Tablet Daily 16)  Vitamin D 1000 Unit Tabs (Cholecalciferol) .... Take One Tablet Daily 17)  Nexium 40 Mg Cpdr (Esomeprazole Magnesium) .... One Daily 18)  Oxycodone-Acetaminophen 5-325 Mg Tabs (Oxycodone-Acetaminophen) .Marland Kitchen.. 1 Tab By Mouth Three Times A Day. May Have Additional Dose Every 3 Hours Prn 19)  Ensure  Liqd (Nutritional Supplements) .... Drink One Can 1 Hour Before Lunch 20)  Acetaminophen 650 Mg Cr-Tabs (Acetaminophen) .... Take One Tab Daily At Bedtime 21)  Mirtazapine 15 Mg Tabs (Mirtazapine) .... 1/2 Tab At Bedtime (This Patient Resides At [facility] [place] [facility] [place]) 22)  Miralax  Powd (Polyethylene Glycol 3350) .... Take One Pack Daily If Needed For Constipation.  Allergies: 1)  ! * Sinemet 2)  Bactrim Ds (Sulfamethoxazole-Trimethoprim) 3)  Gabapentin (Gabapentin) 4)  Codeine 5)  Effexor Xr (Venlafaxine Hcl) 6)  Cymbalta (Duloxetine Hcl) 7)  Lipitor (Atorvastatin Calcium)  Past History:  Past Medical History: Reviewed history from 08/21/2008 and no changes required. 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 August Saucer (recent injection in 9/09 by ?) CT - neck Goiter - 03/02/2000 colonoscopy diffuse diverticulosis & hem - 01/02/2003 Barium Swallow - 06/21/2001 Cardiac Cath - no change - 06/14/2006, Cardiac Cath 8/98 -, Cardiac Cath LAD stent open, RCA stent 80% open - 03/14/2005, Cardiac  Cath-stents patent - 09/12/2005, Cardiolite Neg 6/00 -, Cardiolite-inferior wall thinning, EF 89% - 07/01/2004 CT - Abdominal 6 mm RL lung lesion - 12/10/2002 CT - Chest RL lesion unchanged - 08/30/2004 Vit B12 - 12/30/2002, vit B12 normal 1 year without injection - 05/28/2004  MRI - cerebellar atrophy - 07/17/2002 CT - Chest RL lesion unchanged - 06/06/2006 EGD - mild gastritis - 10/17/2006, EGD 7/99, repeat  gastric polyp - 12/01/2001, EGD benign gastric polyp - 12/29/2001 TSH nl - 01/29/2006,  Adenosine myoview low risk - 01/29/2006  Social History: Reviewed history from 08/21/2008 and no changes required. widowed 1976 Lives at ALF Sons Dorinda Hill and Beaver in [place]; Son Chrissie Noa in [place] non-smoker, but uses 1/3 tin of smokeless tobacco daily non-drinker; [employer] faith  Doing some exercises (those given to her after hip fx) daily  Physical Exam  General:  Vitals reviewed.  POx 98%.  sitting comfortably in chair.  no acute distress Mouth:  pharynx pink and moist.   Neck:  supple, full ROM, and no masses.   Lungs:  Coarse breath sounds throughout.  Decreased air movements at the bases.  No wheezing.  Minimal accesory muscle use.  Able to talk in full sentences Heart:  Normal rate and regular rhythm. S1 and S2 normal without gallop, murmur, click, rub or other extra sounds. Abdomen:  soft and non-tender.   Extremities:  2+ left pedal edema and 2+ right pedal edema.   Skin:  turgor normal and color normal.     Impression & Recommendations:  Problem # 1:  DYSPNEA (ICD-786.05) Assessment New  ? Pneumonia vs bronchitis.  POx is okay and she is able to speak in full sentences.  No fevers.  Precepted with patients PCP (Dr. Sheffield Slider) and he felt like she was near her baseline.  Will treat her with Avelox to cover pneumonia.  Will check CBC.  Will start Avelox with close follow up.  Precautions given for worsening of symptoms  Orders: CBC-[facility] (40981) CXR- 2view (CXR) [facility]- Est  Level 4 (19147)  Complete  Medication List: 1)  Levothroid 75 Mcg Tabs (Levothyroxine sodium) .... Take 1 tablet by mouth once a day at bedtime 2)  Boniva 3 Mg/38ml Kit (Ibandronate sodium) .... Inject every 3 months 3)  Furosemide 40 Mg Tabs (Furosemide) .... Take one in a.m. 4)  Isosorbide Mononitrate Cr 30 Mg Xr24h-tab (Isosorbide mononitrate) .... Take one tablet daily 5)  Plavix 75 Mg Tabs (Clopidogrel bisulfate) .... Take 1 tablet by mouth once a day 6)  Potassium Chloride Crys Cr 20 Meq Tbcr (Potassium chloride crys cr) .... Take 1 tablet by mouth once a day 7)  Simvastatin 20 Mg Tabs (Simvastatin) .... Take 1 tablet by mouth every night 8)  Sm Calcium/vitamin D 500-200 Mg-unit Tabs (Calcium carbonate-vitamin d) .... Take 1 tablet by mouth twice a day 9)  Nitroglycerin 0.4 Mg Subl (Nitroglycerin) .... Take 1 tab sl as needed chest pain 10)  Proair Hfa 108 (90 Base) Mcg/act Aers (Albuterol sulfate) .... 2 puffs every 4 hour prn 11)  Folic Acid 1 Mg Tabs (Folic acid) .... One dialy 12)  Toprol Xl 50 Mg Xr24h-tab (Metoprolol succinate) .... Take 1 tab by mouth daily 13)  Premarin 0.625 Mg/gm Crea (Estrogens, conjugated) .... Apply daily vaginally 14)  Ferrous Sulfate 324 Mg Tbec (Ferrous sulfate) .... Take one tablet daily 15)  Baby Aspirin 81 Mg Chew (Aspirin) .... Take one tablet daily 16)  Vitamin D 1000 Unit Tabs (Cholecalciferol) .... Take one tablet daily 17)  Nexium 40 Mg Cpdr (Esomeprazole magnesium) .... One daily 18)  Oxycodone-acetaminophen 5-325 Mg Tabs (Oxycodone-acetaminophen) .Marland Kitchen.. 1 tab by mouth three times a day. may have additional dose every 3 hours prn 19)  Ensure Liqd (Nutritional supplements) .... Drink one can 1 hour before lunch 20)  Acetaminophen 650 Mg Cr-tabs (Acetaminophen) .... Take one tab daily at bedtime 21)  Mirtazapine 15 Mg Tabs (Mirtazapine) .... 1/2 tab at bedtime (this patient resides at [facility] [place]) 22)  Miralax Powd (Polyethylene glycol 3350) ....  Take one pack daily if needed for constipation. 23)  Avelox 400 Mg Tabs (Moxifloxacin hcl) .Marland Kitchen.. 1 tab by mouth daily for 7 days  Patient Instructions: 1)  You either have bronchitis or pneumonia 2)  I am going to start you on an antibiotic to cover both 3)  Use your inhaler every 4 hours 4)  We will also get a CXR to make sure 5)  If you start feeling worse please let us know 6)  If you start having more shortness of breath then you should go to the Emergency Room 7)  Please schedule a follow up appointment tomorrow to check on how you are doing Prescriptions: AVELOX 400 MG TABS (MOXIFLOXACIN HCL) 1 tab by mouth daily for 7 days  #7 x 0   Entered and Authorized by:   Angelena Sole MD   Signed by:   Angelena Sole MD on 11/11/2010   Method used:   [facility] to        [facility]  [address] [id number]       [address]       [place], [place]  [address]       Ph: [phone]       Fax: [phone]   RxID:   [id number]    Orders Added: 1)  CBC-[facility] [85027] 2)  CXR- 2view [CXR] 3)  [facility]- Est  Level 4 [34742]

## 2010-12-02 NOTE — Assessment & Plan Note (Signed)
Summary: hurt leg,df   Vital Signs:  Patient profile:   77 year old female Height:      62.5 inches Weight:      130 pounds BMI:     23.48 Temp:     98.2 degrees F oral Pulse rate:   84 / minute BP sitting:   127 / 74  (left arm) Cuff size:   regular  Vitals Entered By: Garen Grams LPN (October 26, 2010 2:31 PM)  Primary Provider:  Zachery Dauer MD  CC:  right leg pain x 1 week.  History of Present Illness: Pt presents complaining of swelling and pain in her right leg for about one week.  She has had pain in her leg before, but this is worse than usual.  She says it hurts to walk, and the pain is aggravated by movement, and alleviated by sitting still.  She says she had to call for a percoset in the middle of the night the other night which is unusual for her.    She says she has pain that starts in her back and shoots down the back of her leg.  She also complains of pain in her anterior thigh, calf, and anterior lower leg.  It is tender to touch.  She denies fever/chills.  Denies chest pain or shortness of breath worse than normal.  Denies falls.  Pt has been taking all medications as perscribed.    Allergies: 1)  ! * Sinemet 2)  Bactrim Ds (Sulfamethoxazole-Trimethoprim) 3)  Gabapentin (Gabapentin) 4)  Codeine 5)  Effexor Xr (Venlafaxine Hcl) 6)  Cymbalta (Duloxetine Hcl) 7)  Lipitor (Atorvastatin Calcium)  Review of Systems       Negative except HPI.   Physical Exam  General:  Well-developed,well-nourished,in no acute distress; alert,appropriate and cooperative throughout examination Lungs:  Normal respiratory effort, chest expands symmetrically. Lungs are clear to auscultation, no crackles or wheezes. Heart:  Normal rate and regular rhythm. S1 and S2 normal without gallop, murmur, click, rub or other extra sounds. Pulses:  2+ pulses in bilateral upper extremity and left lower extremity.  Pulse in right lower extremity difficult to palpate 2/2 swelling.  Extremities:   1+ pitting edema of right leg.  Tenderness with palpation.  No erythema or warmth. No lesions.  Tenderness in calf.  Tenderness of anteiror thigh.   Gait: Pt able to stand with walker alone, can walk with small steps with walker.     Impression & Recommendations:  Problem # 1:  SWELLING OF LIMB (ICD-729.81) Concerning for DVT.  Will obtain duplex US to rule out.   Orders: LE Venous Duplex (DVT) (DVT) [facility]- Est  Level 4 (99214)  Problem # 2:  LOW BACK PAIN SYNDROME, SEVERE (ICD-724.2)  Back pain likely contributing to leg pain.  Will continue current pain regimen.  Her updated medication list for this problem includes:    Baby Aspirin 81 Mg Chew (Aspirin) .Marland Kitchen... Take one tablet daily    Oxycodone-acetaminophen 5-325 Mg Tabs (Oxycodone-acetaminophen) .Marland Kitchen... 1 tab by mouth three times a day. may have additional dose every 3 hours prn    Acetaminophen 650 Mg Cr-tabs (Acetaminophen) .Marland Kitchen... Take one tab daily at bedtime  Orders: [facility]- Est  Level 4 ([address]109)  Problem # 3:  GAIT, ABNORMAL (ICD-781.2)  Pt. denies any falls as cause of pain in leg, and is able to walk with walker.   Orders: [facility]- Est  Level 4 (60454)  Problem # 4:  COPD (ICD-496)  At baseline- no  worsening shortness of breath.  Her updated medication list for this problem includes:    Proair Hfa 108 (90 Base) Mcg/act Aers (Albuterol sulfate) .Marland Kitchen... 2 puffs every 4 hour prn  Orders: [facility]- Est  Level 4 (99214)  Complete Medication List: 1)  Levothroid 75 Mcg Tabs (Levothyroxine sodium) .... Take 1 tablet by mouth once a day at bedtime 2)  Boniva 3 Mg/93ml Kit (Ibandronate sodium) .... Inject every 3 months 3)  Furosemide 40 Mg Tabs (Furosemide) .... Take one in a.m. 4)  Isosorbide Mononitrate Cr 30 Mg Xr24h-tab (Isosorbide mononitrate) .... Take one tablet daily 5)  Plavix 75 Mg Tabs (Clopidogrel bisulfate) .... Take 1 tablet by mouth once a day 6)  Potassium Chloride Crys Cr 20 Meq Tbcr (Potassium chloride crys cr) .... Take  1 tablet by mouth once a day 7)  Simvastatin 20 Mg Tabs (Simvastatin) .... Take 1 tablet by mouth every night 8)  Sm Calcium/vitamin D 500-200 Mg-unit Tabs (Calcium carbonate-vitamin d) .... Take 1 tablet by mouth twice a day 9)  Zyrtec Allergy 10 Mg Tabs (Cetirizine hcl) .Marland Kitchen.. 1 tablet by mouth once a day 10)  Nitroglycerin 0.4 Mg Subl (Nitroglycerin) .... Take 1 tab sl as needed chest pain 11)  Proair Hfa 108 (90 Base) Mcg/act Aers (Albuterol sulfate) .... 2 puffs every 4 hour prn 12)  Folic Acid 1 Mg Tabs (Folic acid) .... One dialy 13)  Toprol Xl 50 Mg Xr24h-tab (Metoprolol succinate) .... Take 1 tab by mouth daily 14)  Premarin 0.625 Mg/gm Crea (Estrogens, conjugated) .... Apply daily vaginally 15)  Ferrous Sulfate 324 Mg Tbec (Ferrous sulfate) .... Take one tablet daily 16)  Anusol-hc 2.5 % Crea (Hydrocortisone) .... Apply two times a day 17)  Baby Aspirin 81 Mg Chew (Aspirin) .... Take one tablet daily 18)  Vitamin D 1000 Unit Tabs (Cholecalciferol) .... Take one tablet daily 19)  Nexium 40 Mg Cpdr (Esomeprazole magnesium) .... One daily 20)  Oxycodone-acetaminophen 5-325 Mg Tabs (Oxycodone-acetaminophen) .Marland Kitchen.. 1 tab by mouth three times a day. may have additional dose every 3 hours prn 21)  Ensure Liqd (Nutritional supplements) .... Drink one can 1 hour before lunch 22)  Meclizine Hcl 12.5 Mg Tabs (Meclizine hcl) .... Take one tab every 4 hr as needed vertigo. 23)  Acetaminophen 650 Mg Cr-tabs (Acetaminophen) .... Take one tab daily at bedtime 24)  Mirtazapine 15 Mg Tabs (Mirtazapine) .... 1/2 tab at bedtime (this patient resides at [facility] [place]) 25)  Miralax Powd (Polyethylene glycol 3350) .... Take one pack daily if needed for constipation.   Orders Added: 1)  LE Venous Duplex (DVT) [DVT] 2)  [facility]- Est  Level 4 [45409]   CC: right leg pain x 1 week Is Patient Diabetic? No Pain Assessment Patient in pain? yes     Location: right leg

## 2010-12-02 NOTE — Assessment & Plan Note (Signed)
Summary: meds removed for non-use

## 2010-12-02 NOTE — Progress Notes (Signed)
Summary: meds prob  Phone Note Call from Patient Call back at [phone]   Caller: Daughter-Nancy Summary of Call: was given VELOX 400 MG TABS (MOXIFLOXACIN HCL) needs to be sent to [facility] - [phone] was sent to wrong pharmacy and needs to be faxed to [phone] ASAP Initial call taken by: De Nurse,  November 12, 2010 8:34 AM  Follow-up for Phone Call        done  Follow-up by: Golden Circle RN,  November 12, 2010 8:45 AM    Prescriptions: AVELOX 400 MG TABS (MOXIFLOXACIN HCL) 1 tab by mouth daily for 7 days  #7 x 0   Entered by:   Golden Circle RN   Authorized by:   Angelena Sole MD   Signed by:   Golden Circle RN on 11/12/2010   Method used:   Printed then [facility]ed [facility] ...       [facility] [facility] [address] [id number]       [address]       [place], [place]  [address]       Ph: [phone]       Fax: [phone]   RxID:   [id number]

## 2010-12-02 NOTE — Assessment & Plan Note (Signed)
Summary: Respiratory illness   Vital Signs:  Patient profile:   77 year old female Weight:      130.7 pounds O2 Sat:      98 % on Room air Temp:     97.7 degrees F oral Pulse rate:   86 / minute BP sitting:   154 / 73  (right arm)  Vitals Entered By: Arlyss Repress CMA, (November 18, 2010 10:03 AM)  O2 Flow:  Room air CC: f/up [facility] on Wednesday. upcoming appt with Dr.Little 12-01-10, had to r/s. cough x 2 weeks. Is Patient Diabetic? No Pain Assessment Patient in pain? no        Primary Care Provider:  Zachery Dauer MD  CC:  f/up [facility] on Wednesday. upcoming appt with Dr.Little 12-01-10 and had to r/s. cough x 2 weeks.Marland Kitchen  History of Present Illness: Seen in the ER yesterday for persistent cough and wheezing.  Treated with Avelox on 1/12 at the onset of the symptoms.  Has had 2 CXR in past week, both with not acute findings.  She is most bothered by the cough, she has not taken to bed, has remained up during the day, eating well.  Daughter in law feels that she is improving some.  Has not regularly been using albuterol during this illness.  Prescribed prednisone 60 mg for 4 days by ER doctor.  She would like the cough pills prescribed as they have helped in the past.  She is on chronic narcotics as well.  Habits & Providers  Alcohol-Tobacco-Diet     Tobacco Status: never  Current Medications (verified): 1)  Levothroid 75 Mcg Tabs (Levothyroxine Sodium) .... Take 1 Tablet By Mouth Once A Day At Bedtime 2)  Boniva 3 Mg/46ml  Kit (Ibandronate Sodium) .... Inject Every 3 Months 3)  Furosemide 40 Mg  Tabs (Furosemide) .... Take One in A.m. 4)  Isosorbide Mononitrate Cr 30 Mg Xr24h-Tab (Isosorbide Mononitrate) .... Take One Tablet Daily 5)  Plavix 75 Mg Tabs (Clopidogrel Bisulfate) .... Take 1 Tablet By Mouth Once A Day 6)  Potassium Chloride Crys Cr 20 Meq Tbcr (Potassium Chloride Crys Cr) .... Take 1 Tablet By Mouth Once A Day 7)  Simvastatin 20 Mg Tabs (Simvastatin) ....  Take 1 Tablet By Mouth Every Night 8)  Sm Calcium/vitamin D 500-200 Mg-Unit Tabs (Calcium Carbonate-Vitamin D) .... Take 1 Tablet By Mouth Twice A Day 9)  Nitroglycerin 0.4 Mg Subl (Nitroglycerin) .... Take 1 Tab Sl As Needed Chest Pain 10)  Proair Hfa 108 (90 Base) Mcg/act Aers (Albuterol Sulfate) .... 2 Puffs Every 4 Hour Prn 11)  Folic Acid 1 Mg  Tabs (Folic Acid) .... One Dialy 12)  Toprol Xl 50 Mg Xr24h-Tab (Metoprolol Succinate) .... Take 1 Tab By Mouth Daily 13)  Premarin 0.625 Mg/gm Crea (Estrogens, Conjugated) .... Apply Daily Vaginally 14)  Ferrous Sulfate 324 Mg Tbec (Ferrous Sulfate) .... Take One Tablet Daily 15)  Baby Aspirin 81 Mg Chew (Aspirin) .... Take One Tablet Daily 16)  Vitamin D 1000 Unit Tabs (Cholecalciferol) .... Take One Tablet Daily 17)  Nexium 40 Mg Cpdr (Esomeprazole Magnesium) .... One Daily 18)  Oxycodone-Acetaminophen 5-325 Mg Tabs (Oxycodone-Acetaminophen) .Marland Kitchen.. 1 Tab By Mouth Three Times A Day. May Have Additional Dose Every 3 Hours Prn 19)  Ensure  Liqd (Nutritional Supplements) .... Drink One Can 1 Hour Before Lunch 20)  Acetaminophen 650 Mg Cr-Tabs (Acetaminophen) .... Take One Tab Daily At Bedtime 21)  Mirtazapine 15 Mg Tabs (Mirtazapine) .... 1/2  Tab At Bedtime (This Patient Resides At [facility] in [place]) 22)  Miralax  Powd (Polyethylene Glycol 3350) .... Take One Pack Daily If Needed For Constipation. 23)  Avelox 400 Mg Tabs (Moxifloxacin Hcl) .Marland Kitchen.. 1 Tab By Mouth Daily For 7 Days 24)  Tessalon Perles 100 Mg Caps (Benzonatate) .... One Three Times A Day For One Week (Cough)  Allergies: 1)  ! * Sinemet 2)  Bactrim Ds (Sulfamethoxazole-Trimethoprim) 3)  Gabapentin (Gabapentin) 4)  Codeine 5)  Effexor Xr (Venlafaxine Hcl) 6)  Cymbalta (Duloxetine Hcl) 7)  Lipitor (Atorvastatin Calcium)  Social History: Smoking Status:  never  Review of Systems      See HPI General:  Denies chills, fever, loss of appetite, malaise, and  sweats. ENT:  Denies nasal congestion, sinus pressure, and sore throat. CV:  Denies chest pain or discomfort. Resp:  Complains of cough, sputum productive, and wheezing; denies shortness of breath.  Physical Exam  General:  Coughing but overall appeared at baseling.  Moved well, alert, and talkative. Ears:  grey with retraction Mouth:  no drainage noted Lungs:  Diffuse expiratory wheezing, crackles at bases. Heart:  RR   Impression & Recommendations:  Problem # 1:  BRONCHITIS, ACUTE WITH MILD BRONCHOSPASM (ICD-466.0)  Albuterol neb in office improved wheezing, saturating well at 98%.  Recommended increasing the use of albuterol MDI to q 4 hours while awake for several days then qid for one week then as needed.  Add benzonatate for one week.  Completed prednisone burst initiated by ER. The following medications were removed from the medication list:    Avelox 400 Mg Tabs (Moxifloxacin hcl) .Marland Kitchen... 1 tab by mouth daily for 7 days Her updated medication list for this problem includes:    Proair Hfa 108 (90 Base) Mcg/act Aers (Albuterol sulfate) .Marland Kitchen... 2 puffs every 4 hour prn    Tessalon Perles 100 Mg Caps (Benzonatate) ..... One three times a day for one week (cough)  Orders: [facility]- Est Level  3 (11914)  Problem # 2:  PERNICIOUS ANEMIA (ICD-281.0)  Her updated medication list for this problem includes:    Folic Acid 1 Mg Tabs (Folic acid) ..... One dialy    Ferrous Sulfate 324 Mg Tbec (Ferrous sulfate) .Marland Kitchen... Take one tablet daily  Orders: Vit B12 1000 mcg (J3420) [facility]- Est Level  3 (78295)  Complete Medication List: 1)  Levothroid 75 Mcg Tabs (Levothyroxine sodium) .... Take 1 tablet by mouth once a day at bedtime 2)  Boniva 3 Mg/59ml Kit (Ibandronate sodium) .... Inject every 3 months 3)  Furosemide 40 Mg Tabs (Furosemide) .... Take one in a.m. 4)  Isosorbide Mononitrate Cr 30 Mg Xr24h-tab (Isosorbide mononitrate) .... Take one tablet daily 5)  Plavix 75 Mg Tabs (Clopidogrel  bisulfate) .... Take 1 tablet by mouth once a day 6)  Potassium Chloride Crys Cr 20 Meq Tbcr (Potassium chloride crys cr) .... Take 1 tablet by mouth once a day 7)  Simvastatin 20 Mg Tabs (Simvastatin) .... Take 1 tablet by mouth every night 8)  Sm Calcium/vitamin D 500-200 Mg-unit Tabs (Calcium carbonate-vitamin d) .... Take 1 tablet by mouth twice a day 9)  Nitroglycerin 0.4 Mg Subl (Nitroglycerin) .... Take 1 tab sl as needed chest pain 10)  Proair Hfa 108 (90 Base) Mcg/act Aers (Albuterol sulfate) .... 2 puffs every 4 hour prn 11)  Folic Acid 1 Mg Tabs (Folic acid) .... One dialy 12)  Toprol Xl 50 Mg Xr24h-tab (Metoprolol succinate) .... Take 1  tab by mouth daily 13)  Premarin 0.625 Mg/gm Crea (Estrogens, conjugated) .... Apply daily vaginally 14)  Ferrous Sulfate 324 Mg Tbec (Ferrous sulfate) .... Take one tablet daily 15)  Baby Aspirin 81 Mg Chew (Aspirin) .... Take one tablet daily 16)  Vitamin D 1000 Unit Tabs (Cholecalciferol) .... Take one tablet daily 17)  Nexium 40 Mg Cpdr (Esomeprazole magnesium) .... One daily 18)  Oxycodone-acetaminophen 5-325 Mg Tabs (Oxycodone-acetaminophen) .Marland Kitchen.. 1 tab by mouth three times a day. may have additional dose every 3 hours prn 19)  Ensure Liqd (Nutritional supplements) .... Drink one can 1 hour before lunch 20)  Acetaminophen 650 Mg Cr-tabs (Acetaminophen) .... Take one tab daily at bedtime 21)  Mirtazapine 15 Mg Tabs (Mirtazapine) .... 1/2 tab at bedtime (this patient resides at [facility] in [place]) 22)  Miralax Powd (Polyethylene glycol 3350) .... Take one pack daily if needed for constipation. 23)  Tessalon Perles 100 Mg Caps (Benzonatate) .... One three times a day for one week (cough)  Other Orders: Albuterol Sulfate Sol 1mg  unit dose (Z6109)  Patient Instructions: 1)  Please schedule a follow-up appointment in 1 month after visit with cardiologist 2)  Instructions completed for AL facility to finish prednisone,  increase frequency of albuterol MDI, and add tessalon pearls for one week Prescriptions: TESSALON PERLES 100 MG CAPS (BENZONATATE) one three times a day for one week (cough)  #30 x 0   Entered by:   Luretha Murphy NP   Authorized by:   Zachery Dauer MD   Signed by:   Luretha Murphy NP on 11/18/2010   Method used:   Print then Give to Patient   RxID:   [id number]    Medication Administration  Injection # 1:    Medication: Vit B12 1000 mcg    Diagnosis: PERNICIOUS ANEMIA (ICD-281.0)    Route: IM    Site: L deltoid    Exp Date: 03/31/2012    Lot #: [id number]    Mfr: American Regent    Patient tolerated injection without complications    Given by: Arlyss Repress CMA, (November 18, 2010 10:36 AM)  Medication # 1:    Medication: Albuterol Sulfate Sol 1mg  unit dose    Diagnosis: DYSPNEA (ICD-786.05)    Dose: 2.5mg     Route: inhaled    Exp Date: 04/2012    Lot #: [id number]    Mfr: nephron    Patient tolerated medication without complications    Given by: Tessie Fass CMA (November 18, 2010 11:03 AM)  Orders Added: 1)  Vit B12 1000 mcg [J3420] 2)  Albuterol Sulfate Sol 1mg  unit dose [J7613] 3)  [facility]- Est Level  3 [99213]     Medication Administration  Injection # 1:    Medication: Vit B12 1000 mcg    Diagnosis: PERNICIOUS ANEMIA (ICD-281.0)    Route: IM    Site: L deltoid    Exp Date: 03/31/2012    Lot #: [id number]    Mfr: American Regent    Patient tolerated injection without complications    Given by: Arlyss Repress CMA, (November 18, 2010 10:36 AM)  Medication # 1:    Medication: Albuterol Sulfate Sol 1mg  unit dose    Diagnosis: DYSPNEA (ICD-786.05)    Dose: 2.5mg     Route: inhaled    Exp Date: 04/2012    Lot #: [id number]    Mfr: nephron    Patient tolerated medication without complications    Given by: Tessie Fass CMA (November 18, 2010 11:03 AM)  Orders Added: 1)  Vit B12 1000 mcg [J3420] 2)  Albuterol Sulfate Sol 1mg  unit dose [J7613] 3)  [facility]- Est Level  3 [04540]

## 2010-12-02 NOTE — Progress Notes (Signed)
Summary: waiting for call back/ts  Phone Note Call from Patient Call back at [phone]   Caller: [name] Summary of Call: wants to have her 12noon stopped - family wants to get it themselves- will save a lot of money Initial call taken by: De Nurse,  November 25, 2010 8:44 AM  Follow-up for Phone Call        called pt and lmvm to call back with details. waiting for call back. Follow-up by: Arlyss Repress CMA,,  November 25, 2010 2:57 PM  Additional Follow-up for Phone Call Additional follow up Details #1::        rx for pain meds faxed and original mailed to the nursing home. Additional Follow-up by: Arlyss Repress CMA,,  November 26, 2010 9:24 AM

## 2010-12-02 NOTE — Consult Note (Signed)
Summary: Audiological Evaluation  Audiological Evaluation   Imported By: Knox Royalty 11/25/2010 09:33:49  _____________________________________________________________________  External Attachment:    Type:   Image     Comment:   External Document

## 2010-12-02 NOTE — Progress Notes (Signed)
Summary: Rx oxycodone  Phone Note Call from Patient Call back at [phone]   Caller: [name] Summary of Call: wants to know if Rx for oxycodone can be faxed to them? [phone] Initial call taken by: Knox Royalty,  October 28, 2010 9:09 AM  Follow-up for Phone Call        will fwd. to Dr.Hale Follow-up by: Arlyss Repress CMA,,  October 28, 2010 11:32 AM  Additional Follow-up for Phone Call Additional follow up Details #1::        It can't be faxed because it has to be on the tamper proof paper Additional Follow-up by: Zachery Dauer MD,  October 28, 2010 12:11 PM     Appended Document: Rx oxycodone rx up front for pick up.

## 2010-12-08 NOTE — Progress Notes (Signed)
Summary: dc Ensure pharmacy rx   Phone Note Other Incoming Call back at [phone]   Caller: [facility] Summary of Call: Please send order to have Ensure stopped through pharmacy.  Daughter-in-law will provide her with her Ensure because she only uses during lunch. Initial call taken by: Abundio Miu,  November 26, 2010 9:39 AM    Prescriptions: ENSURE  LIQD (NUTRITIONAL SUPPLEMENTS) Drink one can 1 hour before lunch  #0 x 0   Entered and Authorized by:   Zachery Dauer MD   Signed by:   Zachery Dauer MD on 12/01/2010   Method used:   Printed then faxed to ...       [facility]* (retail)       [address]       [place], [place]  [address]       Ph: [phone]       Fax: [phone]   RxID:   [id number] ENSURE  LIQD (NUTRITIONAL SUPPLEMENTS) Drink one can 1 hour before lunch  #0 x 0   Entered and Authorized by:   Zachery Dauer MD   Signed by:   Zachery Dauer MD on 12/01/2010   Method used:   Print then Give to Patient   RxID:   [id number] ENSURE  LIQD (NUTRITIONAL SUPPLEMENTS) Drink one can 1 hour before lunch  #0 x 0   Entered and Authorized by:   Zachery Dauer MD   Signed by:   Zachery Dauer MD on 12/01/2010   Method used:   Print then Give to Patient   RxID:   [id number]  Faxed to [facility] to discontinue refilling since her daughter-in-law will be supplying it.

## 2010-12-16 ENCOUNTER — Ambulatory Visit (INDEPENDENT_AMBULATORY_CARE_PROVIDER_SITE_OTHER): Payer: Medicare Other | Admitting: Family Medicine

## 2010-12-16 VITALS — BP 170/75 | HR 80 | Ht 62.5 in | Wt 127.4 lb

## 2010-12-16 DIAGNOSIS — I509 Heart failure, unspecified: Secondary | ICD-10-CM

## 2010-12-16 DIAGNOSIS — I10 Essential (primary) hypertension: Secondary | ICD-10-CM

## 2010-12-16 DIAGNOSIS — I5032 Chronic diastolic (congestive) heart failure: Secondary | ICD-10-CM

## 2010-12-16 DIAGNOSIS — I872 Venous insufficiency (chronic) (peripheral): Secondary | ICD-10-CM

## 2010-12-16 NOTE — Patient Instructions (Signed)
Walk as much as you can When you sitting keep your legs elevated most of the time Return in one week in [place] clinic to remove [employer]

## 2010-12-16 NOTE — Progress Notes (Signed)
  Subjective:    Patient ID: [name], female    DOB: 1917/03/03, 77 y.o.   MRN: [id number]  HPI:  Right LE swelling for 6 weeks, work up included venous dopplers.  Cardiologist increased furosemide for one week, which helped with breathing, but had no effect on the edema.  Leg is less swollen in the morning.  Has been walking less as she is is confined to her room because of Norovirus in the facility. Back pain is worse, has not been asking for prn pain meds. Back pain is also on the right, with radiation into her thigh.  This is also the site of her past hip fracture.    Review of Systems  Constitutional: Positive for activity change. Negative for fever, chills, appetite change, fatigue and unexpected weight change.  Respiratory: Negative for cough, chest tightness, shortness of breath and wheezing.   Cardiovascular: Positive for leg swelling. Negative for chest pain and palpitations.  Musculoskeletal: Positive for back pain and gait problem.       Objective:   Physical Exam Faint erythema of the anterior lower leg with 2+ edema, faint pink macules in the area. Chest clear       Assessment & Plan:  Stasis dermatitis due to venous insufficiency. I saw the patient with Luretha Murphy and Dr Jeanice Lim applied the [employer].

## 2010-12-16 NOTE — Assessment & Plan Note (Signed)
Unna boot for one week, instructions to elevate when sitting, and walk as much as possible.

## 2010-12-17 NOTE — Assessment & Plan Note (Signed)
blood pressure elevated today. Will review next visit. If weight rising she may need increased diuresis.

## 2010-12-17 NOTE — Assessment & Plan Note (Signed)
Recent week of increased Furosemide by Dr Clarene Duke. Consider BNP next visit, she may need increase diuretic or antihypertensive

## 2010-12-21 ENCOUNTER — Telehealth: Payer: Self-pay | Admitting: Family Medicine

## 2010-12-21 MED ORDER — OXYCODONE-ACETAMINOPHEN 5-325 MG PO TABS: 1.0000 | ORAL_TABLET | Freq: Three times a day (TID) | ORAL | Status: DC

## 2010-12-21 NOTE — Telephone Encounter (Signed)
Requesting refill on oxycodone 

## 2010-12-21 NOTE — Telephone Encounter (Signed)
Printed and placed up front for pick up by family

## 2010-12-21 NOTE — Telephone Encounter (Signed)
please see refill request for oxycodone

## 2010-12-23 ENCOUNTER — Ambulatory Visit (INDEPENDENT_AMBULATORY_CARE_PROVIDER_SITE_OTHER): Payer: Medicare Other | Admitting: Family Medicine

## 2010-12-23 ENCOUNTER — Telehealth: Payer: Self-pay | Admitting: Family Medicine

## 2010-12-23 VITALS — BP 140/74 | HR 86 | Temp 97.7°F | Wt 128.0 lb

## 2010-12-23 DIAGNOSIS — I872 Venous insufficiency (chronic) (peripheral): Secondary | ICD-10-CM

## 2010-12-23 DIAGNOSIS — I10 Essential (primary) hypertension: Secondary | ICD-10-CM

## 2010-12-23 NOTE — Assessment & Plan Note (Signed)
S/P removal of unna boot  Leg edema and dermatitis much improved Will give script for low compression hose, so that pt

## 2010-12-23 NOTE — Telephone Encounter (Signed)
Was given script for compression stockings 20-30 - was at [facility] medical and they said it would be easier for her to have the 15-20 instead.  Easier to get on. Would like to know if Darl Pikes can change the script for the 15-20.  pls let them know- Fax to [facility] is  [phone]

## 2010-12-23 NOTE — Telephone Encounter (Signed)
Script was written for lower compression and was faxed to [facility].

## 2010-12-23 NOTE — Assessment & Plan Note (Signed)
BP improved without intervention , continue lasix at current dose No anti-hypertensive at this time

## 2010-12-23 NOTE — Progress Notes (Signed)
  Subjective:    Patient ID: [name], female    DOB: [birth year], 77 y.o.   MRN: [id number]  HPI  Pt here for Unna boot removal. Placed secondary to stasis dermatitis secondary to venous insufficiency. Pt received compression hose from her sister wants to know if these will work, Continues to have pain in her lower ext, has been walking some at the home, but they are still quarantined for Norovirus outbreak    Review of Systems per above, gait difficulty, pain in lower ext     Objective:   Physical Exam GEN-NAD, alert LE- decreased erythema and swelling in RLE, 1+ edema bilat, mild TTP of lower ext       Assessment & Plan:    Milinda Antis MD, Resident

## 2011-01-10 ENCOUNTER — Encounter: Payer: Self-pay | Admitting: Home Health Services

## 2011-01-11 LAB — POCT I-STAT, CHEM 8
Creatinine, Ser: 1.2 mg/dL (ref 0.4–1.2)
HCT: 34 % — ABNORMAL LOW (ref 36.0–46.0)
Hemoglobin: 11.6 g/dL — ABNORMAL LOW (ref 12.0–15.0)
Potassium: 4.9 mEq/L (ref 3.5–5.1)
Sodium: 138 mEq/L (ref 135–145)

## 2011-01-11 LAB — POCT CARDIAC MARKERS
CKMB, poc: 1.2 ng/mL (ref 1.0–8.0)
Myoglobin, poc: 110 ng/mL (ref 12–200)

## 2011-01-14 LAB — URINE CULTURE
Colony Count: 75000
Culture  Setup Time: 201108250125

## 2011-01-14 LAB — CBC
HCT: 38.8 % (ref 36.0–46.0)
MCV: 96.6 fL (ref 78.0–100.0)
RBC: 4.01 MIL/uL (ref 3.87–5.11)
WBC: 7.6 10*3/uL (ref 4.0–10.5)

## 2011-01-14 LAB — URINALYSIS, ROUTINE W REFLEX MICROSCOPIC
Bilirubin Urine: NEGATIVE
Ketones, ur: NEGATIVE mg/dL
Protein, ur: NEGATIVE mg/dL
Urobilinogen, UA: 0.2 mg/dL (ref 0.0–1.0)

## 2011-01-14 LAB — DIFFERENTIAL
Eosinophils Relative: 3 % (ref 0–5)
Lymphocytes Relative: 18 % (ref 12–46)
Lymphs Abs: 1.3 10*3/uL (ref 0.7–4.0)
Monocytes Absolute: 0.6 10*3/uL (ref 0.1–1.0)

## 2011-01-14 LAB — POCT I-STAT, CHEM 8
BUN: 21 mg/dL (ref 6–23)
Calcium, Ion: 1.2 mmol/L (ref 1.12–1.32)
Chloride: 101 mEq/L (ref 96–112)
Creatinine, Ser: 0.9 mg/dL (ref 0.4–1.2)
Glucose, Bld: 86 mg/dL (ref 70–99)

## 2011-01-16 ENCOUNTER — Emergency Department (HOSPITAL_COMMUNITY)
Admission: EM | Admit: 2011-01-16 | Discharge: 2011-01-16 | Disposition: A | Discharge status: Home / Self Care | Payer: Medicare Other | Attending: Emergency Medicine | Admitting: Emergency Medicine

## 2011-01-16 DIAGNOSIS — L02419 Cutaneous abscess of limb, unspecified: Secondary | ICD-10-CM | POA: Insufficient documentation

## 2011-01-16 DIAGNOSIS — M810 Age-related osteoporosis without current pathological fracture: Secondary | ICD-10-CM | POA: Insufficient documentation

## 2011-01-16 DIAGNOSIS — J449 Chronic obstructive pulmonary disease, unspecified: Secondary | ICD-10-CM | POA: Insufficient documentation

## 2011-01-16 DIAGNOSIS — I2510 Atherosclerotic heart disease of native coronary artery without angina pectoris: Secondary | ICD-10-CM | POA: Insufficient documentation

## 2011-01-16 DIAGNOSIS — E039 Hypothyroidism, unspecified: Secondary | ICD-10-CM | POA: Insufficient documentation

## 2011-01-16 DIAGNOSIS — I4891 Unspecified atrial fibrillation: Secondary | ICD-10-CM | POA: Insufficient documentation

## 2011-01-16 DIAGNOSIS — E785 Hyperlipidemia, unspecified: Secondary | ICD-10-CM | POA: Insufficient documentation

## 2011-01-16 DIAGNOSIS — I440 Atrioventricular block, first degree: Secondary | ICD-10-CM | POA: Insufficient documentation

## 2011-01-16 DIAGNOSIS — J4489 Other specified chronic obstructive pulmonary disease: Secondary | ICD-10-CM | POA: Insufficient documentation

## 2011-01-16 DIAGNOSIS — I491 Atrial premature depolarization: Secondary | ICD-10-CM | POA: Insufficient documentation

## 2011-01-16 DIAGNOSIS — K219 Gastro-esophageal reflux disease without esophagitis: Secondary | ICD-10-CM | POA: Insufficient documentation

## 2011-01-16 DIAGNOSIS — I10 Essential (primary) hypertension: Secondary | ICD-10-CM | POA: Insufficient documentation

## 2011-01-16 DIAGNOSIS — H81399 Other peripheral vertigo, unspecified ear: Secondary | ICD-10-CM | POA: Insufficient documentation

## 2011-01-16 LAB — CBC
HCT: 36.2 % (ref 36.0–46.0)
MCH: 30.6 pg (ref 26.0–34.0)
MCHC: 32.6 g/dL (ref 30.0–36.0)
MCV: 94 fL (ref 78.0–100.0)
RDW: 12.3 % (ref 11.5–15.5)

## 2011-01-16 LAB — URINALYSIS, ROUTINE W REFLEX MICROSCOPIC
Bilirubin Urine: NEGATIVE
Ketones, ur: NEGATIVE mg/dL
Nitrite: NEGATIVE
pH: 7.5 (ref 5.0–8.0)

## 2011-01-16 LAB — BASIC METABOLIC PANEL
BUN: 16 mg/dL (ref 6–23)
GFR calc non Af Amer: >60 mL/min (ref >60)
Glucose, Bld: 97 mg/dL (ref 70–99)
Potassium: 4 mEq/L (ref 3.5–5.1)

## 2011-01-17 LAB — URINALYSIS, MICROSCOPIC ONLY
Bilirubin Urine: NEGATIVE
Glucose, UA: NEGATIVE mg/dL
Hgb urine dipstick: NEGATIVE
Specific Gravity, Urine: 1.013 (ref 1.005–1.030)
Urobilinogen, UA: 1 mg/dL (ref 0.0–1.0)

## 2011-01-17 LAB — CBC
HCT: 35.5 % — ABNORMAL LOW (ref 36.0–46.0)
HCT: 36.3 % (ref 36.0–46.0)
Hemoglobin: 12.2 g/dL (ref 12.0–15.0)
MCH: 33.1 pg (ref 26.0–34.0)
MCHC: 34.3 g/dL (ref 30.0–36.0)
MCHC: 34.5 g/dL (ref 30.0–36.0)
MCV: 95.8 fL (ref 78.0–100.0)
MCV: 96.2 fL (ref 78.0–100.0)
RDW: 12.6 % (ref 11.5–15.5)
RDW: 13 % (ref 11.5–15.5)
WBC: 7.1 10*3/uL (ref 4.0–10.5)

## 2011-01-17 LAB — DIFFERENTIAL
Basophils Absolute: 0 10*3/uL (ref 0.0–0.1)
Basophils Relative: 0 % (ref 0–1)
Eosinophils Absolute: 0.2 10*3/uL (ref 0.0–0.7)
Eosinophils Relative: 3 % (ref 0–5)
Monocytes Absolute: 0.5 10*3/uL (ref 0.1–1.0)

## 2011-01-17 LAB — TSH: TSH: 1.236 u[IU]/mL (ref 0.350–4.500)

## 2011-01-17 LAB — BASIC METABOLIC PANEL
BUN: 17 mg/dL (ref 6–23)
BUN: 24 mg/dL — ABNORMAL HIGH (ref 6–23)
Chloride: 99 mEq/L (ref 96–112)
Creatinine, Ser: 0.86 mg/dL (ref 0.4–1.2)
GFR calc non Af Amer: 51 mL/min — ABNORMAL LOW (ref >60)
GFR calc non Af Amer: >60 mL/min (ref >60)
Glucose, Bld: 105 mg/dL — ABNORMAL HIGH (ref 70–99)
Glucose, Bld: 116 mg/dL — ABNORMAL HIGH (ref 70–99)
Potassium: 4.7 mEq/L (ref 3.5–5.1)

## 2011-01-17 LAB — URINE CULTURE

## 2011-01-17 LAB — CK TOTAL AND CKMB (NOT AT ARMC)
Relative Index: INVALID (ref 0.0–2.5)
Total CK: 46 U/L (ref 7–177)

## 2011-01-17 LAB — CARDIAC PANEL(CRET KIN+CKTOT+MB+TROPI): Troponin I: 0.03 ng/mL (ref 0.00–0.06)

## 2011-01-17 LAB — TROPONIN I: Troponin I: 0.01 ng/mL (ref 0.00–0.06)

## 2011-01-18 ENCOUNTER — Other Ambulatory Visit: Payer: Self-pay | Admitting: Family Medicine

## 2011-01-18 DIAGNOSIS — H8110 Benign paroxysmal vertigo, unspecified ear: Secondary | ICD-10-CM

## 2011-01-18 DIAGNOSIS — M48061 Spinal stenosis, lumbar region without neurogenic claudication: Secondary | ICD-10-CM

## 2011-01-18 MED ORDER — OXYCODONE-ACETAMINOPHEN 5-325 MG PO TABS: 1.0000 | ORAL_TABLET | Freq: Three times a day (TID) | ORAL | Status: DC

## 2011-01-18 NOTE — Assessment & Plan Note (Signed)
[name], sent a fax from [facility] requesting Oxycodone hardscript asap.

## 2011-01-18 NOTE — Assessment & Plan Note (Signed)
She was seen in the ER 3/18 for vertigo treated with Meclizine and also treated for lower leg cellulitis with Keflex

## 2011-01-19 ENCOUNTER — Telehealth: Payer: Self-pay | Admitting: *Deleted

## 2011-01-19 NOTE — Telephone Encounter (Signed)
Called [facility] to let them know that [name]'s scrip was ready.  She said I could fax it to them [phone]) them mail the script.  Their address is [address]  [phone].

## 2011-01-19 NOTE — Telephone Encounter (Signed)
Received a call from patient's pharmacy.  Patient is requesting refill on her Oxycodone.  If any question the pharmacist can be reached at [phone]

## 2011-01-20 ENCOUNTER — Other Ambulatory Visit: Payer: Self-pay | Admitting: Family Medicine

## 2011-01-20 DIAGNOSIS — M48061 Spinal stenosis, lumbar region without neurogenic claudication: Secondary | ICD-10-CM

## 2011-01-20 MED ORDER — OXYCODONE-ACETAMINOPHEN 5-325 MG PO TABS: 1.0000 | ORAL_TABLET | Freq: Three times a day (TID) | ORAL | Status: DC

## 2011-01-20 NOTE — Telephone Encounter (Signed)
I printed a new Rx because the facility sent someone to pick it up. They should then have 2 Rx's when the mailed one arrives. If they request one next month we should ask what was done with the second Rx and I'll look on-line to make sure that it's not being diverted.

## 2011-01-21 NOTE — Telephone Encounter (Addendum)
[name] with [facility] came by office yesterday at 1:30 wanting to pick up hard copy of RX for oxycodone /acetaminophen that was written by Dr. Sheffield Slider. 01/18/2011.  He  brings in the faxed copy that the pharmacy received and pharmacy wants hard copy. Unable to locate hard copy.  Dr. Sheffield Slider notified and he did RX again and this was given to [name]..  Later it was realized that the hard copy of the RX written 01/20/2011 was mailed . Actually went out in mail yesterday. There is a note in chart about this. Dr. Sheffield Slider notified.

## 2011-01-25 ENCOUNTER — Ambulatory Visit (INDEPENDENT_AMBULATORY_CARE_PROVIDER_SITE_OTHER): Payer: Medicare Other | Admitting: Family Medicine

## 2011-01-25 VITALS — BP 118/58 | HR 88 | Temp 97.4°F | Wt 128.0 lb

## 2011-01-25 DIAGNOSIS — D510 Vitamin B12 deficiency anemia due to intrinsic factor deficiency: Secondary | ICD-10-CM

## 2011-01-25 DIAGNOSIS — I872 Venous insufficiency (chronic) (peripheral): Secondary | ICD-10-CM

## 2011-01-25 MED ORDER — CYANOCOBALAMIN 1000 MCG/ML IJ SOLN
1000.0000 ug | Freq: Once | INTRAMUSCULAR | Status: AC
  Administered 2011-01-25: 1000 ug via INTRAMUSCULAR

## 2011-01-25 NOTE — Progress Notes (Signed)
  Subjective:    Patient ID: [name], female    DOB: August 04, 1917, 77 y.o.   MRN: [id number]  HPI went to ER last week for red painful right LE, has been being treated for venous insuff with compression but ER felt that infection was present and she was started on Keflex.  She reports some improvement.  She has not been wearing her compression stocking as much as she cannot get them on herself and they staff do not always help her.    B12 shot today    Review of Systems  Constitutional: Negative for fever and chills.  Cardiovascular: Positive for leg swelling.  Musculoskeletal: Negative for joint swelling.  Neurological: Negative for dizziness and weakness.       Objective:   Physical Exam  Constitutional:       Usual appearing  Cardiovascular: Normal rate.        Irregular rhythmn Right lower leg, red, hard, tender (consistent with exam of the past)  Pulmonary/Chest: Effort normal and breath sounds normal.          Assessment & Plan:

## 2011-01-25 NOTE — Patient Instructions (Signed)
One month follow up with Dr. Burman Nieves stockings first thing in the morning to prevent swelling

## 2011-01-26 NOTE — Assessment & Plan Note (Signed)
Visit to ER, being treated for cellulitis, no real change in appearance or pain per family.  Believe this is inflammatory and chronic and will respond best to wearing compression stockings daily.  Wrote order for facility to place first in the AM and remove at night.  She did respond to a Unna boot for several weeks, may need intermittent placement to get edema under control.

## 2011-01-26 NOTE — Assessment & Plan Note (Signed)
B12 today 

## 2011-01-31 LAB — DIFFERENTIAL
Basophils Relative: 0 % (ref 0–1)
Lymphocytes Relative: 12 % (ref 12–46)
Monocytes Absolute: 0.7 10*3/uL (ref 0.1–1.0)
Monocytes Relative: 7 % (ref 3–12)
Neutro Abs: 8.1 10*3/uL — ABNORMAL HIGH (ref 1.7–7.7)
Neutrophils Relative %: 80 % — ABNORMAL HIGH (ref 43–77)

## 2011-01-31 LAB — COMPREHENSIVE METABOLIC PANEL
Albumin: 4.4 g/dL (ref 3.5–5.2)
Alkaline Phosphatase: 64 U/L (ref 39–117)
BUN: 30 mg/dL — ABNORMAL HIGH (ref 6–23)
Calcium: 9.9 mg/dL (ref 8.4–10.5)
Creatinine, Ser: 0.97 mg/dL (ref 0.4–1.2)
Glucose, Bld: 105 mg/dL — ABNORMAL HIGH (ref 70–99)
Total Protein: 7.8 g/dL (ref 6.0–8.3)

## 2011-01-31 LAB — POCT CARDIAC MARKERS
Myoglobin, poc: 103 ng/mL (ref 12–200)
Troponin i, poc: <0.05 ng/mL (ref 0.00–0.09)

## 2011-01-31 LAB — CBC
HCT: 36.6 % (ref 36.0–46.0)
HCT: 39.7 % (ref 36.0–46.0)
Hemoglobin: 13.7 g/dL (ref 12.0–15.0)
MCHC: 34.5 g/dL (ref 30.0–36.0)
MCV: 94.6 fL (ref 78.0–100.0)
Platelets: 165 10*3/uL (ref 150–400)
Platelets: 190 10*3/uL (ref 150–400)
RDW: 13.1 % (ref 11.5–15.5)
WBC: 8.1 10*3/uL (ref 4.0–10.5)

## 2011-01-31 LAB — BASIC METABOLIC PANEL
BUN: 25 mg/dL — ABNORMAL HIGH (ref 6–23)
Chloride: 106 mEq/L (ref 96–112)
Potassium: 3.7 mEq/L (ref 3.5–5.1)

## 2011-01-31 LAB — APTT: aPTT: 25 seconds (ref 24–37)

## 2011-01-31 LAB — CARDIAC PANEL(CRET KIN+CKTOT+MB+TROPI)
CK, MB: 2.3 ng/mL (ref 0.3–4.0)
CK, MB: 2.7 ng/mL (ref 0.3–4.0)
Relative Index: INVALID (ref 0.0–2.5)
Total CK: 77 U/L (ref 7–177)
Troponin I: 0.01 ng/mL (ref 0.00–0.06)
Troponin I: 0.03 ng/mL (ref 0.00–0.06)

## 2011-01-31 LAB — TROPONIN I: Troponin I: 0.01 ng/mL (ref 0.00–0.06)

## 2011-01-31 LAB — PROTIME-INR: INR: 1.07 (ref 0.00–1.49)

## 2011-02-10 ENCOUNTER — Other Ambulatory Visit: Payer: Self-pay | Admitting: Family Medicine

## 2011-02-10 DIAGNOSIS — M48061 Spinal stenosis, lumbar region without neurogenic claudication: Secondary | ICD-10-CM

## 2011-02-10 LAB — URINALYSIS, ROUTINE W REFLEX MICROSCOPIC
Bilirubin Urine: NEGATIVE
Hgb urine dipstick: NEGATIVE
Protein, ur: NEGATIVE mg/dL
Urobilinogen, UA: 0.2 mg/dL (ref 0.0–1.0)

## 2011-02-10 LAB — POCT I-STAT, CHEM 8
Creatinine, Ser: 1 mg/dL (ref 0.4–1.2)
Glucose, Bld: 90 mg/dL (ref 70–99)
Hemoglobin: 12.6 g/dL (ref 12.0–15.0)
TCO2: 27 mmol/L (ref 0–100)

## 2011-02-10 MED ORDER — OXYCODONE-ACETAMINOPHEN 5-325 MG PO TABS: 1.0000 | ORAL_TABLET | Freq: Three times a day (TID) | ORAL | Status: DC

## 2011-02-10 NOTE — Assessment & Plan Note (Addendum)
Fax request for refill of oxycodone and Tylenol. Will be handwritten and placed to be picked up

## 2011-02-23 ENCOUNTER — Encounter: Payer: Self-pay | Admitting: Family Medicine

## 2011-02-24 ENCOUNTER — Ambulatory Visit (INDEPENDENT_AMBULATORY_CARE_PROVIDER_SITE_OTHER): Payer: Medicare Other | Admitting: Family Medicine

## 2011-02-24 ENCOUNTER — Encounter: Payer: Self-pay | Admitting: Family Medicine

## 2011-02-24 VITALS — BP 152/70 | HR 83 | Temp 97.6°F | Wt 129.0 lb

## 2011-02-24 DIAGNOSIS — I872 Venous insufficiency (chronic) (peripheral): Secondary | ICD-10-CM

## 2011-02-24 DIAGNOSIS — E538 Deficiency of other specified B group vitamins: Secondary | ICD-10-CM

## 2011-02-24 DIAGNOSIS — M2010 Hallux valgus (acquired), unspecified foot: Secondary | ICD-10-CM

## 2011-02-24 MED ORDER — CYANOCOBALAMIN 1000 MCG/ML IJ SOLN
1000.0000 ug | Freq: Once | INTRAMUSCULAR | Status: AC
  Administered 2011-02-24: 1000 ug via INTRAMUSCULAR

## 2011-02-24 NOTE — Progress Notes (Signed)
Toe pain: Pt has a right foot bunion that is causing her 2nd toe to elevate and rub against her shoes. She says it causes pain and some swelling in her feet.   Lower Leg swelling; Pt has improvement in her lower ext edema. She has been wearing the compression stocking more regularly but is nor wearing it today. Discussed wearing is daily and she says that she has to ask for it to be put on. We asked her to be assertive and ask daily for it to be put on.   Hip pain: Pt recently had an injection done at Dr. Alfonso Patten office (ortho) in her left SI joint. Pt was concerned about this affecting her osteoporosis but we discussed that if it helps with pain and keeps her functional then it is a good thing and she could have more as indicated in the future.   ROS: neg except swelling in Rt leg and toe pain as noted in HPI. + hearing loss at baseline  PE: Gen: sitting comfortably in chair.  CV: RRR, no murmur or rubs Pulm: CTAB, no wheezes or crackles.  Ext: Rt leg has minimal trace edema, Rt foot has large bunion with lateral deviation of great toe and forced lifting of the 2nd toe. Second toe is tender.

## 2011-02-24 NOTE — Assessment & Plan Note (Signed)
Pt has a right foot bunion that is causing her second toe to elevate and rub against her shoe. Recommended seeing her Podiatrist (she already has one) or buying bigger shoes.

## 2011-02-24 NOTE — Progress Notes (Signed)
Addended by: Eliseo Gum, ASHA on: 02/24/2011 10:57 AM   Modules accepted: Orders

## 2011-02-24 NOTE — Patient Instructions (Addendum)
IT was good to see you today.  Remember to wear the stocking daily.  Come back in 3 months to see Dr. Sheffield Slider. Come back in May and June to get B12 shots.

## 2011-02-24 NOTE — Assessment & Plan Note (Signed)
Pt's leg is improved since last visit.  Plan to continue use of compression stocking.  Encouraged pt to ask daily for help putting it on.

## 2011-03-04 ENCOUNTER — Telehealth: Payer: Self-pay | Admitting: Family Medicine

## 2011-03-04 NOTE — Telephone Encounter (Signed)
Requesting handwritten rx for percocet, call when ready

## 2011-03-04 NOTE — Telephone Encounter (Signed)
Forward to Dr Hale 

## 2011-03-16 ENCOUNTER — Telehealth: Payer: Self-pay | Admitting: Family Medicine

## 2011-03-16 NOTE — Telephone Encounter (Signed)
MAR from [name] [facility] reconciled. Cirtrazine removed because not on the [facility]. Letter from cardiologist, Al Little also reviewed and put in box to be scanned. He had her increase furosemide to 40 mg bid for 3 days due to DOE

## 2011-03-16 NOTE — Telephone Encounter (Signed)
Assisted Living , [facility] [place] dropped off a  Medication form, needs to be signed by MD. Pls call when ready for pick up.

## 2011-03-18 NOTE — H&P (Signed)
[place]  Patient:    [name], [name]                     MRN: [id number] Adm. Date:  [id number] Attending:  Burnard Bunting                         History and Physical  CHIEF COMPLAINT:  Right hip pain.  HISTORY OF PRESENT ILLNESS:  [name] is an 77 year old community ambulator who lives in an apartment who slipped on the grass today and injured her right hip.  She denies any other orthopedic injuries.  PAST MEDICAL HISTORY:  Hypothyroidism.  Anemia.  Anxiety.  Hypertension. Coronary artery disease.  Atrial fibrillation.  COPD.  Gastroesophageal reflux.  Diverticulosis.  DVT.  Osteoarthritis.  Bursitis of the shoulder.  CURRENT MEDICATIONS:  Percocet, Ativan, calcium, digoxin, Imdur, K-Dur, Lasix, sublingual nitro, sorbitol, Synthroid, Tenormin, vitamin B12.  PAST SURGICAL HISTORY:  Open gallbladder and removal of bleeding hemorrhagic thyroid nodule.  Her heart history is significant for coronary artery disease status post stent placement in 1999.  She also has a history of atrial fibrillation.  PHYSICAL EXAMINATION:  HEENT:  Normal.  GENERAL:  Patient is awake and alert but in pain.  CHEST:  Clear to auscultation.  There are no rales at the bases.  HEART:  Heart beats with regular rate and rhythm.  ABDOMEN:  Benign.  EXTREMITIES:  Patient has had prior right shoulder surgery.  The right hip is painful to move.  Pulses are trace palpable in the 1+ VP in both feet. Dorsiflexion and plantar flexion of each foot is intact.  Left hip has non-painful range of motion in the hip and knee and ankle.  Sensation to light touch is intact dorsal and plantar surface of the foot.  PLAIN X-RAYS:  Demonstrates intertrochanteric fracture.  CHEST X-RAY:  Normal.  EKG:  No change from prior EKG done 12/00.  REVIEW OF SYSTEMS:  The patient denies any recent shortness of breath or chest pain.  IMPRESSION:  Right hip intertrochanteric  fracture.  PLAN:  Open reduction internal fixation.  Risks and benefits are discussed with the patient and her daughter-in-law.  Risks include nonunion, infection, bleeding, blood clot formation, and death.  Patient and family understands this risk and will proceed with surgery. DD:  03/07/00 TD:  03/08/00 Job: [id number]

## 2011-03-18 NOTE — Discharge Summary (Signed)
[name], [name]              ACCOUNT NO.:  [id number]   MEDICAL RECORD NO.:  [id number]          PATIENT TYPE:  INP   LOCATION:  2003                         FACILITY:  [facility]   PHYSICIAN:  Madaline Savage, M.D.DATE OF BIRTH:  [birth year]   DATE OF ADMISSION:  06/09/2006  DATE OF DISCHARGE:  06/14/2006                                 DISCHARGE SUMMARY   DATE OF BIRTH:  May 15, 2004.   DISCHARGE DIAGNOSES:  1. Unstable angina with shortness of breath and shoulder pain, negative      myocardial infarction, and patent stents per cardiac cath of June 12, 2006.  2. Sinus brady cardia.  At discharge sinus rhythm with first degree AV      block after decreasing medicine.  3. Hypotension, now resolved.  4. Anxiety.   DISCHARGE CONDITION:  Improved.   PROCEDURES:  June 12, 2006:  Left heart cath by Dr. Elsie Lincoln with patent  stents to the RCA, the ramus, and the LAD proximal and mid with an EF of  60%.   DISCHARGE MEDICATIONS:  1. Plavix 75 mg daily .  2. Imdur 30 mg daily.  3. Zocor 20 mg daily.  4. Levothyroxine 75 mcg daily.  5. Plendil 10 mg daily.  6. Prilosec 20 mg daily.  7. Zyrtec 10 mg daily.  8. Toprol-XL 25, 1/2 tab daily.  9. Aspirin 81 mg daily.  10.Furosemide 20 mg daily.  11.K-Dur 20 mEq daily.  12.Nitroglycerin sublingual as needed for chest pain.  13.Calcium as before.  14.Stop Lopressor and use Toprol.   DISCHARGE INSTRUCTIONS:  1. A low fat diet.  2. Wash right groin cath site with soap and water, call if any bleeding,      swelling, or drainage.  3. Followup with Dr. Sheffield Slider, July 04, 2006.  4. Followup with Dr. Clarene Duke, July 20, 2006 at 9:40 a.m.   HISTORY OF PRESENT ILLNESS:  An 77 year old female cardiology patient of Dr.  Clarene Duke with a history of coronary disease with stenting to the RCA in 2002,  LAD in June of 2005 with 2 sites and history of stent to the ramus left cath  prior to this admission was November 2006 with patent  stents, EF was 60.  In  April  of 2007 she had chest pain, Cardiolite study was negative for MI.   Patient was seen June 09, 2006 in the office as an add-on for chest pain,  shoulder pain, and shortness of breath.  Over the last 2 nights prior to  admission she had tightness in her chest and shortness of breath.  She did  not take nitro, she would get up and walk around but her symptoms did not  resolve.  She was seen in the office and admitted to go to the Hospital for  further evaluation.   PAST MEDICAL HISTORY:  As described as well as hypothyroidism treated,  hypertension treated, dyslipidemia, gastroesophageal reflux disease,  anxiety, she has a history of brief PAF  in the past.  She has also had a  right lower lobe lung resection  on CT that has been evaluated.   FAMILY HISTORY, SOCIAL HISTORY, REVIEW OF SYSTEMS:  See H&P.   OUTPATIENT MEDS:  1. Lasix 20.  2. Simvastatin 40.  3. Prilosec 20.  4. Imdur 30.  5. Potassium 20. Levothyroxine 75.  6. Lopressor 25 b.i.d.  7. Aspirin 81.  8. Felodipine.  9. Plavix 75.   PHYSICAL EXAM AT DISCHARGE:  Vital signs:  Blood pressure 134/78, pulse 75,  respiratory is 20, temp 97.1, oxygen saturation room air 97%.  Heart:  S1,  S2.  Regular rate and rhythm with a 1/6 systolic murmur.  Lungs:  Clear to  auscultation bilaterally.  Abdomen:  Soft.  Extremities:  Without edema.   LABORATORY DATA:  Hemoglobin 12.8, hematocrit 36.9, WBC 5.7, platelets 229,  at discharge these were fairly stable and hemoglobin 11.7, hematocrit 33.3,  WBC 5, and platelets 180.  Neutrophils of 55, lymph 29, monos 10, eos 6,  basos 1.  Pro time INR 1.1.  PTT 31.  D-dimer was 0.46.  Chemistry:  Sodium  140, potassium 4.1, chloride 105, CO2 30, glucose 101, BUN 14, creatinine  0.9, calcium 10, total protein 7.6, albumin 4.3, AST 25, ALT 14, alkaline  phos 90, total bili 0.5.  Cardiac enzymes:  CK 60, 51 and 47;  MB is  1.5,  1.3, troponin I 0.01.  __________ ,  TSH 0.936.  UA was clear.  EKG normal  sinus rhythm, first degree AV block, left axis deviation, old inferior  infarction.  No changes on followup EKGs.   HOSPITAL COURSE:  [name] was admitted by Dr. Clarene Duke on June 09, 2006  for chest pain, shortness of breath.  She was placed on telemetry bed and  ruled out for an MI.  She will continue as above with increase in Imdur her  blood pressure dropped to 64 systolic down to 52, IV fluids were given and  rapid response team had ultimately been called here and she was transferred  to the CCU.   Patient was started on dopamine she was given IV atropine.  Heparin was  started per pharmacy.  Patient was improving and we decided to go ahead with  cardiac cath to evaluate.  Stents were found to be patent on the cardiac  cath.  Dr. Clarene Duke was still concerned, he had Dr. Sheffield Slider evaluate her.  He  felt it was related to the increase in the Imdur  with the drop of the blood pressure.  By June 14, 2006 she was stable and  ready for discharge home with a change of medications.  She will follow up  with Dr. Sheffield Slider as well as Dr. Clarene Duke.  Please note:  ALLERGIES TO EFFEXOR,  CODEINE, LIPITOR, CYMBALTA, AND PENICILLIN.      Darcella Gasman. Annie Paras, N.P.    ______________________________  Madaline Savage, M.D.    LRI/MEDQ  D:  07/16/2006  T:  07/16/2006  Job:  [id number]   cc:   Thereasa Solo. Little, M.D.  Wayne A. Sheffield Slider, M.D.  Madaline Savage, M.D.

## 2011-03-18 NOTE — H&P (Signed)
[name], [name] NO.:  [id number]   MEDICAL RECORD NO.:  [id number]          PATIENT TYPE:  INP   LOCATION:  5503                         FACILITY:  [facility]   PHYSICIAN:  Asencion Partridge, M.D.     DATE OF BIRTH:  03-27-1917   DATE OF ADMISSION:  09/01/2005  DATE OF DISCHARGE:                                HISTORY & PHYSICAL   PRIMARY CARE PHYSICIAN:  Dr. Zachery Dauer.   CHIEF COMPLAINT:  Chest pain.   HISTORY OF PRESENT ILLNESS:  The patient is an 77 year old female with  multiple medical problems including coronary artery disease, and a recent  increase in number of episodes of chest pain that occur with activity. She  has decreased symptoms after she rests. Today she had increased shortness of  breath, nausea and left arm numbness along with chest pain after mild  activity. She had a minimal decrease in her symptoms after using sublingual  nitroglycerin x3. She went to the emergency room for evaluation, still with  some chest pain. She did not have EKG changes. At the time of evaluation she  was pain free after having nitroglycerin paste placed (2 cm) on her left  deltoid.   REVIEW OF SYSTEMS:  CONSTITUTIONAL:  No fever, no chills. CARDIOVASCULAR:  As above. PULMONARY: As above. GI: Positive nausea with chest pain. GU: No  changes.  SKIN:  No rash.   PROBLEM LIST:  The patient has a history of goiter, hyperthyroidism,  hyperlipidemia, anemia, anxiety, tremor, restless leg syndrome,  hypertension, arterial sclerosis, atrial fibrillation, COPD, GERD,  diverticulitis, osteoarthritis, bursitis, osteoporosis, pernicious anemia.  She has had multiple cardiac interventions, notably a cardiac  catheterization with an RCA stent of 80% open in May of this year. She  showed arterial wall thinning on a Cardiolite in 2005 with an ejection  fraction of 89%. She had a previous TSH that was normal in April of this  year.   MEDICATIONS:  Alprazolam, Calcium, centalopram,  Felodipine, Furosemide,  levothyroxine, nitroglycerin, Omeprazole, Plavix, potassium chloride,  Senokot, Simvastatin, Toprol, Tums, Vicodin and Zyrtec.   ALLERGIES:  CODEINE, EFFEXOR, LIPITOR AND CYMBALTA.   FAMILY HISTORY:  Her son had coronary artery disease. Her daughter died at  age 30 with breast cancer.  Her mother had hypertension and heart disease. Father died at age 81 of  kidney disease, alcohol complications. She has a brother age 24.   SOCIAL HISTORY:  She lives alone. She was widowed in 1966. She is a  nonsmoker, non drinker. She is a Control and instrumentation engineer. She has sons in [place] and one  in [place].   PHYSICAL EXAMINATION:  VITAL SIGNS:  Temperature 98.1, pulse 70, respiratory  rate 20, blood pressure 142/79, O2 saturation is 99% on room air.  GENERAL:  She is in no apparent distress. She does have a tremor in her  hands which is an old finding.  MENTAL STATUS:  She is alert and oriented.  HEENT:  Normocephalic, atraumatic. Eyes - right pupil is not as reactive as  the left (old finding). She has no oropharyngeal erythema. Mucous membranes  are moist.  No pain on palpation.  LUNGS:  Clear to auscultation bilaterally.  HEART:  Regular rate and rhythm, no murmurs, rubs, or gallops appreciated.  ABDOMEN:  Soft, nontender, nondistended, positive bowel sounds.  EXTREMITIES:  Trace edema. Pulses are symmetric.  RECTAL EXAM:  Deferred.  NEUROLOGIC:  She moves all extremities, strength and sensation within normal  limits in upper and lower extremities. Balance was not tested.  SKIN:  No rash or adenopathy. She has no lymphadenopathy noted.   LABORATORY DATA:  BNP 88. Electrolytes - sodium 139, potassium 3.5, chloride  105, bicarb 29.6, BUN 14, creatinine 0.8, glucose 92. Hemoglobin 12.6,  hematocrit 37.0. Point of care enzymes negative x2. EKG reveals no acute ST  changes. Chest x-ray - no acute disease.   ASSESSMENT/PLAN:  The patient is an 77 year old female with the following   problems.  1.  Chest pain. She has a history of coronary artery disease and she is a      high risk patient. We will admit her and rule out myocardial infarction      with enzymes and EKGs. She is chest pain free right now so will give      nitroglycerin p.r.n. If she has recurrent chest pain will consult Dr.      Clarene Duke of cardiology in the morning. See below for other risk factor      issues.  2.  Hypertension. Continue her home medications.  3.  Lipids. Continue her home medications.  4.  Anxiety. Continue her SS RID.  5.  Deep venous thrombosis prophylaxis with Lovenox.  6.  Hypothyroidism. Need replacement. Check a TSH. Her last TSH was 6 months      ago and was normal.  7.  Gastroesophageal reflux disease. Continue proton pump inhibitor.  8.  History of anemia. Her hemoglobin can be followed.   DISPOSITION:  Pending number one.      [name], M.D.    ______________________________  Asencion Partridge, M.D.    GSD/MEDQ  D:  09/01/2005  T:  09/02/2005  Job:  [id number]   cc:   Deniece Portela A. Sheffield Slider, M.D.  Fax: [phone]

## 2011-03-18 NOTE — H&P (Signed)
NAMEVERNEAL, WIERS              ACCOUNT NO.:  [id number]   MEDICAL RECORD NO.:  [id number]          PATIENT TYPE:  INP   LOCATION:  1843                         FACILITY:  [facility]   PHYSICIAN:  Wayne A. Sheffield Slider, M.D.    DATE OF BIRTH:  [birth year]   DATE OF ADMISSION:  02/14/2006  DATE OF DISCHARGE:                                HISTORY & PHYSICAL   CHIEF COMPLAINT:  Chest pain, palpitations.   HISTORY OF PRESENT ILLNESS:  The patient is an 77 year old female with a  history of hypertension, hyperlipidemia, coronary artery disease status post  multiple stents as well as paroxysmal atrial fibrillation who presents with  complaints of diffuse chest pain radiating to her shoulders and neck  accompanied by shortness of breath occurring intermittently and at rest,  lasting a few minutes over the past month.  The patient also has complained  of feeling that her heart is going thumping as well as beating fast  intermittently for periods lasting for one to two minutes, waking her up at  night.  These irregular heart beats are accompanied by feelings of chest  pain and shortness of breath.  These palpitations have been occurring for  the past week or two.  The patient had been on propranolol for paroxysmal  atrial fibrillation but this was discontinued, in February 2007, secondary  to pneumonia.  Last week she was started on metoprolol at the [facility].  The patient also complains of worsening mid epigastric  pain with burning up into her chest as well as difficulty swallowing.  The  patient has had dysphagia for years and this has been worked up in the past.  She does have a history of GERD but the epigastric pain has worsened over  the past week or two.  The patient also complains of bilateral shoulder pain  as well as numbness bilaterally in both her arms which is presumed secondary  to the patient's history of neck problems.   REVIEW OF SYSTEMS:  No fevers, no chills, no  cough, no nausea, vomiting, or  diarrhea, no dysuria, no calf pain, no rashes, no leg swelling.  The  remainder is per HPI.   PAST MEDICAL HISTORY:  1.  Goiter.  2.  Hypothyroidism.  3.  Hyperlipidemia.  4.  Anemia.  5.  Anxiety.  6.  Essential tremor.  7.  Hypertension.  8.  Coronary artery disease.      1.  Cardiac cath, in November 2006, showing four patent stents with a          normal left ventricle ejection fraction and trivial restenosis of          the proximal LAD stent.  9.  Paroxysmal atrial fibrillation.  10. COPD.  11. GERD.  12. Diverticulosis.  13. Osteoarthritis.  14. Subdeltoid bursitis.  15. Osteoporosis.  16. Pernicious anemia.  17. Right lower lobe lung lesion on chest CT which has been evaluated      multiple times.  18. Recent hospitalization, March 2007, for pneumonia.   MEDICATIONS:  1.  Alprazolam 1 mg p.o. q.h.s.  2.  Aspirin 81 mg p.o. every day.  3.  Calcium acetate with D, two tablets daily.  4.  Citalopram 10 mg p.o. every day.  5.  Felodipine ER 10 mg p.o. every day.  6.  Furosemide 40 mg p.o. p.r.n.  7.  Levothyroxine 0.075 mg p.o. every day.  8.  Metoprolol ER 25 mg p.o. every day.  9.  Nitroglycerin 0.4 mg sublingually as needed.  10. Omeprazole 20 mg p.o. b.i.d.  11. Plavix 75 mg p.o. every day.  12. KCl 20 mEq p.o. every day.  13. Primidone 25 mg p.o. b.i.d.  14. Senokot S two daily p.r.n.  15. Simvastatin 20 mg p.o. q.h.s.  16. Vicodin 5/500 one to two tablets every six hours as needed.  17. Zyrtec 10 mg p.o. every day.   ALLERGIES:  1.  CODEINE causes nausea.  2.  EFFEXOR causes orthostasis.  3.  LIPITOR causes palpitations.  4.  CYMBALTA causes constipation.   FAMILY HISTORY:  Son with coronary artery disease.  Daughter died at age 12  with breast cancer.  Her Mom died at age 74 and had hypertension and heart  disease.  Her father died at age 30 of kidney disease.  She has a brother  who passed away at age 86 secondary to  alcohol complications.   SOCIAL HISTORY:  She lives with her son and daughter-in-law.  She was  widowed in 1976.  She is a nonsmoker, does not drink alcohol.   PHYSICAL EXAMINATION:  VITAL SIGNS:  Temperature 97.9, pulse 88, blood  pressure 132/62, respiratory rate 18, 95% on room air.  GENERAL APPEARANCE:  No acute distress.  She is alert and oriented x3.  HEENT:  Pupils are equally round, reactive to light and accommodation.  Extraocular motion is intact.  Mucous membranes are moist.  Throat is  without erythema or exudate.  NECK:  Patient with positive left sided goiter.  No carotid bruits are  appreciated.  CHEST:  Chest pain is reproducible to palpation.  LUNGS:  Patient with slight crackle left lower lobe.  Otherwise clear to  auscultation bilaterally.  HEART:  Regular rate and rhythm.  No murmurs, rubs, or gallops.  ABDOMEN:  Patient with mild midepigastric tenderness to palpation.  She has  positive bowel sounds.  No rebounding or guarding.  EXTREMITIES:  With 2+ dorsalis pedis pulses bilaterally.  No cyanosis,  clubbing, or edema.  Homan sign negative.  NEUROLOGIC:  Cranial nerves II-XII are grossly intact.  Patient with 2+ DTRs  bilaterally, symmetric throughout.  The patient also with 4+ out of 5  strength both upper and lower extremities symmetric.  Patient with decreased  neck range of motion to flexion extension as well as rotation both left and  right.  Gait was not tested.   LABORATORY DATA:  White blood cell count 6.5, hemoglobin 11.7, hematocrit  33.7, platelet count 235.  PT 14.2, INR 1.1, PTT 31.  Point-of-care enzymes  negative x1.  BNP mildly elevated at 135.  Sodium 133, potassium 4.7,  chloride 100, bicarb 28, BUN 17, creatinine 0.8, glucose 92, calcium 9.2.  Total protein 6.7, albumin 4.0, AST 25, ALT 10, alk phos 79, total bilirubin  0.7.  Chest x-ray showed stable mild cardiomegaly with bibasilar scarring but no acute disease.  EKG was normal sinus rhythm  at 81 beats per minute.  Patient with first degree A-V block as well as left axis deviation with no  changes on prior EKGs.   ASSESSMENT/PLAN:  An 77 year old female with:  1.  Atypical chest pain.  The patient's chest pain occurs at rest but also      is relieved with rest.  Differential diagnoses include anginal/cardiac      chest pain, versus costochondritis, versus secondary to irregular heart      beat, versus pulmonary embolism.  The patient is with known coronary      artery disease and multiple stents which were patent on cardiac      catheterization in November 2006.  We will admit to telemetry, cycle      cardiac enzymes.  We will continue aspirin, Plavix, and beta-blocker.      We will only start heparin if cardiac enzymes increase.  2.  Palpitations.  Patient with history of paroxysmal atrial fibrillation.      We will monitor on telemetry.  We will increase beta-blocker to      metoprolol 50 mg orally twice a day per cardiology recommendations. We      will also check the patient's TSH.  3.  Gastrointestinal.  The patient with symptoms of gastroesophageal reflux      disease.  We will start Protonix 40 mg orally twice a day.  Patient with      history of dysphagia and this has been evaluated in the past with EGD.  4.  Hypertension.  The patient's metoprolol increased as above.  We will      continue the patient's felodipine.  5.  Essential tremor.  The patient's essential tremor is much improved on      primidone which was started last week.  We will continue this      medication.  6.  Hypothyroidism.  We will check TSH.  We will also continue Synthroid 75      mcg daily.  7.  Hyperlipidemia.  The patient's LDL was 58 on last admission in March      2007.  We will continue Zocor.  8.  Back pain with bilateral arm numbness.  Consider neck films.  I will      discuss with primary medical doctor, the extent of workup which has been      done and what should be done here in  the hospital.  9.  Anxiety.  We will continue the patient's citalopram  10 mg once daily.  10. Osteoporosis.  We will continue the patient's Os-Cal plus D 500 mg      orally twice a day.  11. Chronic obstructive pulmonary disease.  The patient without signs or      symptoms of chronic obstructive pulmonary disease exacerbation.  The      patient's Spiriva was recently discontinued and we will continue to hold      this medication.  12. Anemia.  Hemoglobin at baseline from prior discharge.  I will discuss      with primary medical doctor and workup as needed.  13. Deep vein thrombosis prophylaxis.  We will use sequential compression      devices.      Benn Moulder, M.D.    ______________________________  Arnette Norris. Sheffield Slider, M.D.    MR/MEDQ  D:  02/14/2006  T:  02/14/2006  Job:  [id number]

## 2011-03-18 NOTE — Cardiovascular Report (Signed)
NAMEMARGHERITA, [name]              ACCOUNT NO.:  [id number]   MEDICAL RECORD NO.:  [id number]          PATIENT TYPE:  INP   LOCATION:  2003                         FACILITY:  [facility]   PHYSICIAN:  Madaline Savage, M.D.DATE OF BIRTH:  03/14/1917   DATE OF PROCEDURE:  06/12/2006  DATE OF DISCHARGE:                              CARDIAC CATHETERIZATION   PROCEDURES PERFORMED:  1. Selective coronary angiography by Judkins technique.  2. Retrograde left heart catheterization.  3. Left ventricular angiography.   COMPLICATIONS:  None.   ENTRY SITE:  Right femoral.   DYE USED:  Omnipaque.   PATIENT PROFILE:  [name] is a very pleasant 77 year old white female  patient of Dr. Caprice Kluver who entered the hospital recently with chest pain  with known coronary disease and continued to have heaviness in her chest. It  is somewhat pleuritic, and Vicodin helps.  She had hypotension with an  episode of chest pain recently and was moved to the intensive care unit.  Today she enters the catheterization lab electively for diagnostic cardiac  catheterization given the clinical presentation she exhibits.   RESULTS:   PRESSURES:  Left ventricular pressure and central aortic pressure showed no  evidence of aortic valve gradient, and both were normal.   ANGIOGRAPHIC RESULTS:  The patient had patent coronary arteries with stents  in the proximal LAD, mid LAD, proximal intermediate ramus, and mid RCA.  No  other new lesions were seen.   The left main was normal.   The LAD had two patent stents; the first was near the takeoff of a large  diagonal.  The second one was well beyond septal perforator branch #2.   Left circumflex was nondominant and gave rise to a posterolateral branch.  The circumflex was normal.  There was a proximal intermediate ramus branch  stent that was patent. Distal runoff was good.   Right coronary artery was very large and dominant, giving rise to multiple  posterolateral  branches and an acute marginal branch.   Left ventricular angiography showed normal contractility of all wall  segments.  No mitral regurgitation seen.   FINAL DIAGNOSES:  1. Chest pain syndrome, possibly anginal  2. Multivessel coronary disease of left anterior descending, ramus      intermediate and right coronary artery status post stenting.  3. All four stents in the above-stated vessel described above were patent      today.  No new disease discovered.  4. Normal left ventricular systolic function.   PLAN:  The patient can be moved from the intensive care unit to telemetry.  Her chest pain syndrome appears to be either chest wall or gastrointestinal.  The patient is to be reassured.           ______________________________  Madaline Savage, M.D.     WHG/MEDQ  D:  06/12/2006  T:  06/12/2006  Job:  [id number]   cc:   Thereasa Solo. Little, M.D.  [facility]  [facility] Catheterization Lab

## 2011-03-18 NOTE — Procedures (Signed)
[place]. [facility]  Patient:    [name], [name] Visit Number: [id number] MRN: [id number]          Service Type: END Location: ENDO Attending Physician:  Orland Mustard Dictated by:   Llana Aliment. Randa Evens, M.D. Proc. Date: 12/04/01 Admit Date:  12/04/2001   CC:         Wayne A. Sheffield Slider, M.D.   Procedure Report  DATE OF BIRTH:  1916/12/09  PROCEDURE:  Esophagogastroduodenoscopy and biopsy.  SURGEON:  James L. Randa Evens, M.D.  MEDICATIONS:  Fentanyl 25 mcg and Versed 3.5 mg IV, Cetacaine spray.  INDICATIONS:  Dysphagia with vague chest discomfort.  Upper GI really showed a small hiatal hernia with some GE reflux, otherwise was unremarkable.  The source of her symptoms are unclear.  She has been on Prilosec b.i.d. which has not really clearly helped.  DESCRIPTION OF PROCEDURE:  The procedure had been explained to the patient and consent obtained.  With the patient in the left lateral decubitus position, the Olympus video endoscope was inserted blindly into the esophagus, and advanced under direct visualization.  The stomach was entered.  The pylorus identified and passed.  The duodenum including the bulb and second portion were seen well.  The scope was withdrawn back into the stomach.  There was a small antral polyp that appeared to be a hyperplastic polyp.  This was biopsied just to confirm this.  It was 2-3 mm in diameter.  There were no ulcerations.  There was diffuse gastric atrophy consistent with the patients age.  The fundus and cardia were seen well on retroflexed view.  There was no significant hiatal hernia,  The distal and proximal esophagus were seen well and were endoscopically normal.  The scope was withdrawn.  The patient tolerated the procedure well.  Was maintained on low flow oxygen and pulse oximeter.  ASSESSMENT:  Gastric polyp, biopsied.  PLAN:  Will continue the patient on the same medicines for now, and see her back in  the office in four to six weeks. Dictated by:   Llana Aliment. Randa Evens, M.D. Attending Physician:  Orland Mustard DD:  12/04/01 TD:  12/04/01 Job: [id number]

## 2011-03-18 NOTE — Consult Note (Signed)
[place]. [facility]  Patient:    [name], [name]                     MRN: [id number] Proc. Date: 03/08/00 Adm. Date:  [id number] Attending:  Burnard Bunting Dictator:   Cheree Ditto, M.D. CC:         Deniece Portela A. Sheffield Slider, M.D.                          Consultation Report  [name] is an 77 year old female well known to [facility] who fractured her right hip after slipping on wet grass and falling yesterday.  No syncope. She is now post-op day number zero, status post right hip and intertrochanteric fixation and feeling much better than yesterday.  Her pain is well controlled.  PAST MEDICAL HISTORY:  1.  Goiter.  2.  Hypothyroidism.  Status post 1973 partial thyroidectomy.  3.  Anemia.  4.  Anxiety.  5.  Essential tremor.  6.  Hypertension.  7.  Coronary artery disease status post angioplasty and stint in September      1999 and August 2000.  8.  Atrial fibrillation on chronic digoxin.  9.  History of DVT July 2000. 10.  COPD. 11.  Gastroesophageal reflux. 12.  Diverticulosis of the colon, last colonoscopy March 2000. 13.  Osteoarthritis, status post cervical laminectomy. 14.  helix valgus. 15.  Dysfunctional uterine bleeding and labial irritation.  Status post D&C in      April 2000. 16.  ______.  MEDICATIONS:  1.  Aspirin 81 mg q.d.  2.  Ativan one half q. a.m., one q. h.s.  3.  Calcium carbonate 500 mg b.i.d.  4.  Celexa 20 mg q.d.  5.  Darvocet-N 100 one p.o. q4h p.r.n.  6.  Digoxin 0.125 mg q.d.  7.  Flonase one spray each nostril q.d.  8.  Imdur 30 mg one half q.d.  9.  K-Dur 20 mEq two p.o. q.d. 10.  Lasix 40 mg one half tablet and one tablet on alternating days. 11.  nitroglycerin 0.1 mg p.r.n. 12.  Percocet one q4h p.r.n. 13.  Prilosec 20 mg b.i.d. 14.  Sorbitol 1-2 teaspoons b.i.d. 15.  Synthroid 0.1 mg q.d. 16.  Tenormin 50 mg one half p.o. b.i.d. 17.  Vitamin B12 q.m.o.  ALLERGIES:  Codeine causes nausea and Effexor causes  orthostasis.  SOCIAL HISTORY:  Lives alone.  She was widowed in 1976.  She is a nonsmoker and nondrinker, and a [employer].  FAMILY HISTORY:  Her son has coronary artery disease.  Her daughter died at age 33 of breast cancer.  Her sister died at 40 of breast cancer.  Her mom died at age 67 of hypertensive heart disease.  Her father died at age 33 of kidney disease.  PHYSICAL EXAMINATION:  VITAL SIGNS:  Temperature 99.4, heart rate 84, blood pressure 147/65, O2 saturation 95% on room air.  GENERAL:  She is pleasant and in no acute distress.  HEENT:  Pupils equal, round, and reactive to light and accommodation. Extraocluar movements are intact.  Oropharynx is good.  NECK:  Supple without lymphadenopathy.  There is a goiter.  CARDIOVASCULAR:  Regular rate and rhythm with no auscultated murmur.  LUNG SOUNDS:  Decreased breath sounds at the bases, otherwise clear to auscultation bilaterally.  There is normal respiratory effort.  ABDOMEN:  Well healed surgical scar.  Soft, nontender, positive bowel sounds. No organomegaly.  No mass.  EXTREMITIES:  The right lower extremity has an ace bandage.  There normal lower extremity pulses.  NEURO:  Cranial nerves 2-12 are intact.  LABORATORY on Mar 07, 2000:  Digoxin 1.1, white count 11.2, hemoglobin 11.7, hematocrit 33.3, platelets 198, sodium 131, potassium 4.1, chloride 99, CO2 29, BUN 12, creatinine 1.0, glucose 111, calcium 9.0.  Mar 07, 2000: Urinalysis negative, PTT 26, INR 1.1, PT 13.7, hemoglobin 13.2.  ASSESSMENT/PLAN:  [age]-year-old female with multiple medical problems status post right intertrochanteric fixation after a fall.   1.  Hip fracture on pain management ______.  Patient may be a candidate for      q week Fosamax.  2.  Atrial fibrillation: she is rate controlled.  Continue digoxin.  3.  Hypertension and coronary artery disease: continue beta blocker.  Aspirin      is held while she is on Coumadin.  Will adjust Lasix  and potassium to the      home doses.  Continue Imdur.  4.  Depression/anxiety: continue Ativan.  Restart Celexa.  5.  Gastroesophageal reflux disease: restart home dose of Prilosec.  6.  Anemia: hemoglobin stable considering surgery and IV fluid hydration.  Thanks for allowing [place] to be involved in [name] care. DD:  03/07/00 TD:  03/08/00 Job: [id number]

## 2011-03-18 NOTE — Discharge Summary (Signed)
[name], [name]              ACCOUNT NO.:  [id number]   MEDICAL RECORD NO.:  [id number]          PATIENT TYPE:  INP   LOCATION:  3027                         FACILITY:  [facility]   PHYSICIAN:  Alanson Puls, M.D.    DATE OF BIRTH:  08/22/1917   DATE OF ADMISSION:  12/24/2005  DATE OF DISCHARGE:  01/02/2006                                 DISCHARGE SUMMARY   ATTENDING PHYSICIAN AT DISCHARGE:  [name], M.D.   ADMISSION DIAGNOSES:  1.  Hypothyroidism with goiter.  2.  Hyperlipidemia.  3.  Anemia.  4.  Anxiety.  5.  Tremor.  6.  Hypertension.  7.  Coronary artery disease, status post coronary artery bypass grafting in      November 2006.  8.  Paroxysmal atrial fibrillation.  9.  Chronic obstructive pulmonary disease.  10. Gastroesophageal reflux disease.  11. Diverticulitis, diverticulosis.  12. Osteoarthritis.  13. Osteoporosis.  14. History of deep venous thrombosis.  15. History of right lower lobe lesion noted on CT that had been extensively      followed up in the past, last imaged in 2005, which showed a 6      millimeter right lower lobe lesion.   DISCHARGE DIAGNOSES:  1.  Influenza infection.  2.  Post influenza pneumonitis.  3.  Chronic obstructive pulmonary disease.  4.  Hypothyroidism.  5.  Hyperlipidemia.  6.  Anemia.  7.  Anxiety.  8.  Tremor.  9.  Hypertension.  10. Coronary artery disease, status post coronary artery bypass grafting in      November 2006, with stent placement.  11. Paroxysmal atrial fibrillation.  12. Chronic obstructive pulmonary disease.  13. Gastroesophageal reflux disease.  14. Diverticulosis.  15. Osteoarthritis.  16. Osteoporosis.  17. History of deep venous thrombosis.   CONSULTATIONS:  Physical therapy consult that stated that the patient walked  at least 150 feet with assist, but had to stop and catch her breath. Sat's  were maintained at greater than 90% on room air at that time. They did  recommend that there  be a home health PT for safety in motion program in  order to prevent falls in the home. We will set this up prior to discharge.   PROCEDURES:  None.   HOSPITAL COURSE:  [name] is an 77 year old female who was initially on  February 24 with complaint of fever x2 days, nausea, cough, as well as  shortness of breath. She stated that her symptoms had worsened over the past  12 hours prior to admission and she was found to be febrile at 101.9 in the  emergency department and satting at 90% on room air.   PROBLEM LIST:  1.  For shortness of breath, cough and fever, the patient was initially      placed on Rocephin and azithromycin. Influenza A and B was obtained      which came back positive and the patient was treated with Tamiflu for a      total of 48 hours, as she had presented within the time frame to receive  this. Also, a urinalysis was obtained, which showed no significant      findings and blood cultures had been no growth to date. Respiratory      cultures were obtained and were concerning for gram-positive cocci in      pairs, chains and in clusters. Therefore, the patient was treated with      vancomycin while the cultures were pending, as well as Rocephin. These      cultures did come back showing normal flora. The vancomycin was      discontinued; however, the patient still continued to have an O2      requirement, and was requiring at least 2-3 liters of nasal cannula.      Chest x-ray was obtained which revealed bilateral post influenza      pneumonitis. The patient was continued on Rocephin and azithro and then      changed over to p.o. Avelox, and she was to complete a total of a 14      days course of this. Prior to discharge, the patient was satting greater      than 94% on room air.  2.  For her chronic lung disease and COPD, the patient was placed on      Atrovent nebulizers throughout her hospital course, 0.5 mg neb solution      and was not receiving this any  more frequently than every 4 hours. She      was also placed on a prednisone taper 30 mg, to complete a total of a 7      day course. In addition, she was placed on Spiriva 18 mcg 1 puff to take      daily. Of note, the Atrovent will be discontinued at discharge,      secondary to the patient's being placed on Spiriva. Throughout her      hospital, she did have an episode of chest pain that was relieved by 2      nitroglycerin's sublingual. She did receive cardiac enzymes which were      negative x3. Her initial EKG did show normal sinus rhythm with a primary      AV block, with the PR ranging from 200 to 220. This was consistent with      the previous EKG. The patient had no other episodes of chest pressure      throughout her hospitalization.  3.  For AFib, the patient had been in normal sinus rhythm since she had been      here and had a history of paroxysmal atrial fibrillation. She was not on      chronic anticoagulation secondary to multiple risk factors; however, we      will continue her on aspirin 81 mg p.o. daily.  4.  For coronary artery disease, status post CABG in November 2006, we will      continue the patient on Plavix 75 mg as well as aspirin. The patient's      cholesterol LDL was 58 while she was on Zocor 40; however, this had been      held secondary to the patient's complaint of myalgia while on this      medication. We recommended that the primary medical doctor follow up      with this complaint; however, given the fact that the patient's LDL is      at goal, we may consider some other medication choice.  5.  For hypertension, initially, the patient did have episodes of  low blood      pressure and some of her home medications were held, including her Lasix      40 mg; however, we did continue the patient's Plendil for blood pressure      control and she was 136-150/75-85 prior to discharge. Her primary     medical doctor may consider adding back on Lasix at some point  to better      control the patient's blood pressure.  6.  For hypothyroidism, the patient's thyroid was stable throughout her      hospitalization. TSH was obtained which was 1.117, therefore, we      continued the patient on her Synthroid 75 mcg daily.  7.  For anxiety/depression, the patient was continued on her home dose of      Xanax 1 mg at bedtime as needed, as well as citalopram for depression.  8.  For history of DVT, the patient was placed on Lovenox prophylactically,      as well as Protonix b.i.d. for history of GERD; however, we encouraged      ambulation with assistance for the patient to prevent DVT in the future.  9.  For osteoporosis, the patient was to continue on Os-Cal 2 tabs b.i.d.,      and if she had not been started on Fosamax or bisphosphonate, consider      this for an outpatient basis and warn the patient of possible side      effects of this medication.  10. For anemia, the patient's hemoglobin prior to discharge was 11.2, with a      normal MCV. The patient had no signs or symptoms of anemia. We      recommended that this be followed up on an outpatient basis. Fecal      occult blood tests in house were negative.  11. Fluid, Electrolytes, and Nutrition: The patient was saline locked on her      IV fluids. All electrolytes were stable prior to discharge. She was      taking a low salt diet with Ensure supplements 3 times a day.   DISCHARGE MEDICATIONS:  1.  Guaifenesin 600 mg tab p.o. b.i.d. for congestion.  2.  Senokot-S 2 tabs daily as needed for constipation.  3.  Vicodin 5/500 1-2 tabs every 6 hours as needed for pain.  4.  Nitroglycerin 0.5 sublingual every 5 minutes up to 3 times, as needed      for chest pain. The patient was instructed to call 911 if chest pain      persisted.  5.  Xanax 1 mg p.o. q.h.s. as needed for sleep.  6.  Spiriva 18 mcg 1 puff daily.  7.  Prednisone 30 mg by mouth for 1 more day for a total of a 7 day course.  8.  Avelox 400  mg tab p.o. daily until March 13, for a total of a 14 day      course.  9.  Aspirin 81 mg by mouth.  10. Celexa 10 mg by mouth.  11. Plendil 10 mg by mouth daily.  12. Synthroid 75 mcg by mouth daily.  13. Protonix 40 mg by mouth twice daily.  14. Os-Cal 500 mg p.o. b.i.d.  15. Plavix 75 mg daily for a total of 6 months.   She was to return to Dr. Loleta Chance and was given the number, [phone], to follow  up at [facility] on January 17, 2006, at 10:00 a.m.  She was instructed to return to the emergency department for fever,  shortness of breath, difficulty breathing or any other complaints.      Alanson Puls, M.D.     MR/MEDQ  D:  01/02/2006  T:  01/03/2006  Job:  [id number]

## 2011-03-18 NOTE — Discharge Summary (Signed)
[place]. [facility]  Patient:    [name], [name]                     MRN: [id number] Adm. Date:  [id number] Disc. Date: 04/03/00 Attending:  Sanjuana Letters Dictator:   Talmage Nap, M.D. CC:         [id number]             Wayne A. Sheffield Slider, M.D.             Thereasa Solo. Little, M.D.                           Discharge Summary  DISCHARGE DIAGNOSES:  1. Chest pain, ruled out for myocardial infarction, likely muscular.  2. History of coronary artery disease, status post stent x 2.  3. History of a lower extremity deep venous thrombosis on the left July 2000.  4. Status post hip fracture of the right hip May 2001.  5. History of atrial fibrillation.  6. History of anxiety.  7. History of hypothyroidism.  8. History of anemia.  9. Hypertension. 10. History of osteoporosis.  DISCHARGE MEDICATIONS:  1. Ativan 0.5 mg q.a.m. and 1 mg q.h.s.  2. Calcium carbonate 500 mg b.i.d.  3. Coumadin 2 mg q.d.  4. Darvocet-N 100 p.r.n.  5. Digoxin 0.125 mg q.d.  6. Imdur 30 mg q.d.  7. K-Dur 40 mEq q.d.  8. Lasix 20 mg q.d.  9. Percocet p.r.n. 10. Prilosec 20 mg b.i.d. 11. Sorbitol 1-2 teaspoons b.i.d. 12. Synthroid 0.1 mg q.d. 13. Tenormin 25 mg q.d.  DISCHARGE INSTRUCTIONS:  The patient will be discharged to extended care facility.  Prior orders should be resumed.  The patient was transferred here on the 2nd.  HOSPITAL COURSE:  #1 - CHEST PAIN:  [name] is an 77 year old female with a significant history of coronary artery disease status post stent placement x 2.  She was admitted on the 2nd of June for chest pain.  She reports after bathing she began having left-sided chest pain which radiated to her left neck and right chest.  She did have shortness of breath and diaphoresis but no nausea or vomiting.  She did have significant improvement with sublingual nitroglycerin and was transferred here.  [name] also reports recently increasing her activity  to where she is now transferring herself.  She is status post hip fracture and now is able to toe touch and has had increased activity with her upper extremities.  She was tender to palpation over that chest area.  The patient was admitted and placed on telemetry for observation.  Cardiac enzymes showed negative CKs and MBs as well as troponins.  EKG was negative for any ischemic changes.  The patient was discharged on hospital day #2 after complete resolution of her chest pain.  This was likely to be muscular and no further workup was obtained.  #2 - HIP FRACTURE:  The patient is status post hip fracture with ORIF of the right hip.  She is being followed through the nursing home and is receiving rehabilitation there.  No changes were necessary during this hospitalization.  DISPOSITION:  The patient is discharged in stable disposition.  She will be discharged to [facility] and prior orders resumed.  She was having no chest pain at the time of discharge. DD:  04/03/00 TD:  04/03/00 Job: [id number]

## 2011-03-18 NOTE — H&P (Signed)
[place]. [facility]  Patient:    [name]                       MRN: [id number] Adm. Date:  04/01/00 Attending:  Santiago Bumpers. Leveda Anna, M.D. Dictator:   Talmage Nap, M.D. CC:         Wayne A. Sheffield Slider, M.D.                         History and Physical  ADMITTING DIAGNOSES:  1. Chest pain, rule out myocardial infarction.  2. Known coronary artery disease with stent placement x 2.  3. History of hypothyroidism.  4. History of chronic obstructive pulmonary disease.  5. History of recent hip fracture, Mar 15, 2000 of the right hip.  6. History of anemia.  7. History of hypertension.  8. History of paroxysmal atrial fibrillation.  9. History of deep venous thrombus. 10. Gastroesophageal reflux disease. 11. History of diverticulosis. 12. History of osteoarthritis and osteoporosis. 13. History of chronic pain.  HISTORY OF PRESENT ILLNESS:  [name] is an 77 year old female with a known history of coronary artery disease with stent placement x 2 who is admitted for acute onset of chest pain this afternoon at approximately 11 a.m. The patient was bathing at the time and began to wheel herself out into the hallway when she began having severe right and left-sided chest pain.  She described originally as sharp in nature which progressed to pressure throughout her right and left chest and left neck.  It was associated with shortness of breath and diaphoresis but no nausea or vomiting.  The pain improved with sublingual nitroglycerin and the patient is now pain-free. The patient does report increased activity recent with increased transferred from her bed beginning yesterday.  She denies any recent PND, orthopnea, dizziness, or syncope. The patient does have a recent hip fracture occurring on Mar 15, 2000 for which she is in a nursing home for rehabilitation.  PAST MEDICAL HISTORY:  See problem list.  MEDICATIONS:  1. Ativan 0.5 mg q. a.m., 1 mg q. h.s.  2. Calcium carbonate b.i.d.  3. Coumadin 2 mg q.d.  4. Darvocet p.r.n.  5. Digoxin 0.125 mg q.d.  6. Imdur 30 mg q.d.  7. K-Dur 40 mEq q.d.  8. Lasix 20 mg q.d.  9. Percocet p.r.n. 10. Prilosec 20 b.i.d. 11. Sorbitol one to two teaspoons b.i.d. 12. Synthroid 0.1 mg q.d. 13. Tenormin 25 mg q.d. 14. Vitamin B12 q. month.  ALLERGIES:  CODEINE and EFFEXOR.  SOCIAL HISTORY:  The patient recently transferred to nursing home of rehab. She generally lives alone.  She is a nonsmoker and nondrinker.  She is widowed with children in [place].  FAMILY HISTORY:  Positive for coronary artery disease in her son.  Positive for breast cancer in her daughter and sister.  Positive for hypertension  and heart disease in her mother who died at age 36.  Positive for kidney disease in her father, he died at age 40.  REVIEW OF SYSTEMS:  Positive for chest pain, shortness of breath, diaphoresis, and dyspnea on exertion.  Now pain-free.  Positive for recent increased activity.  No fever, chills, weight changes, or appetite changes.  Chronic right hip pain secondary to fracture.  No cough, wheezing or nasal discharge. No nausea, vomiting, diarrhea, or constipation.  No rash.  No recent mental status changes.  No frequency or dysuria.  PHYSICAL EXAMINATION:  VITAL  SIGNS:  Temperature is 98.2, blood pressure 113/58, pulse 74, respiratory 18, O2 saturations 97% on room air.  GENERAL:  An elderly female in no distress.  HEENT:  Head normocephalic, atraumatic.  Pupils equal, round and reactive to light.  Extraocular movements are intact.  No conjunctival injection.  Mouth is pink without erythema or exudate.  NECK:  Supple without lymphadenopathy or thyromegaly.  There is no JVD.  There are no bruits.  CARDIOVASCULAR:  Reveals regular rate and rhythm with a 2/6 systolic ejection murmur.  CHEST:  Clear to auscultation bilaterally.  ABDOMEN:  Soft with positive bowel sounds.  Nontender and  nondistended.  EXTREMITIES:  No clubbing, cyanosis or edema. There is a well healed scar on the right hip, status post ORIF.  Peripheral pulses 2+ and equal bilaterally.  RECTAL EXAM:  Deferred.  There is a reproducible pain to palpation over the left chest and arm.  LABORATORY DATA:  CK 55 with an MB of 0.8.  Relative index 1.5.  Troponin less than 0.03.  White count 10.1, hemoglobin 12.4, platelets 320.  Sodium 134, potassium 4.1, chloride 95, bicarb 29, BUN 19, creatinine 0.8, glucose 99. LFTs normal.  ABG 7.47/38/82/28.  EKG reveals sinus rhythm at 69 bpm.  There is minimal ST depression in II and III.  There is borderline criteria for first degree AV block.  Chest x-ray shows no infiltrates and hyperinflation.  ASSESSMENT AND PLAN:  This an 77 year old female with known coronary artery disease and current chest pain. 1. Chest pain.  This is likely muscular given the history of patients    increasing exertion yesterday with reproducible pain to palpation, but    given history of coronary artery disease with stent placement, will admit    the patient for rule out myocardial infarction.  The patient was given    aspirin and will continue her beta blocker.  Sublingual nitroglycerin will    be given, the patient will not be started on heparin or nitroglycerin.  We    will obtain serial electrocardiograms.  Given her history of hip fracture,    a pulmonary embolus is possible but it doubtful given normal arterial blood    gases and resolution of patients symptoms.  She is currently on low dose    Coumadin. 2. Chronic problems.  Will continue her home medications. 3. History of hip fracture.  Will allow bed to chair transfers only.  Continue    her Coumadin.  Percocet for pain. 4. Check a TSH given history of hypothyroidism. 5. Check digoxin level given chronic digoxin. DD:  04/01/00 TD:  04/02/00 Job: [id number]

## 2011-03-18 NOTE — Discharge Summary (Signed)
[name], [name]              ACCOUNT NO.:  [id number]   MEDICAL RECORD NO.:  [id number]          PATIENT TYPE:  INP   LOCATION:  6526                         FACILITY:  [facility]   PHYSICIAN:  Wayne A. Sheffield Slider, M.D.    DATE OF BIRTH:  [birth year]   DATE OF ADMISSION:  02/14/2006  DATE OF DISCHARGE:  02/16/2006                                 DISCHARGE SUMMARY   PRIMARY CARE PHYSICIAN:  Dr. Sheffield Slider at the [facility].   PRIMARY CARDIOLOGIST:  Dr. Clarene Duke at [facility].   DISCHARGE DIAGNOSES:  1.  Atypical chest pain.  2.  History of paroxysm atrial fibrillation.  3.  Dysphagia.  4.  Gastroesophageal reflux disease.  5.  Anxiety.  6.  Hypothyroidism.  7.  Hyperlipidemia.  8.  Essential tremor.   PROCEDURES:  1.  Chest x-ray on February 14, 2006 showed no acute disease and stable      cardiomyopathy.  2.  EKG showed normal sinus rhythm at 81 beats per minute with a first      degree AV block and left axis deviation but no changes from prior EKGs.   LABORATORY DATA:  White blood cell count 6.5, hemoglobin 11.7, hematocrit  33.7, platelet count 235,000.  PT 14.2, INR 1.1, PTT 31.  Point of care  enzymes negative x1.  BNP 135.  Sodium 133, potassium 4.7, chloride 100,  bicarb 28, BUN 17, creatinine 0.8, glucose 92, calcium 9.2, total protein  6.7, albumin 4.0, AST 25, ALT 10, alkaline phosphate 79, total bilirubin  0.7.   Labs during hospital course, BNP 135.  Cardiac enzymes were negative x3  initially and then were also negative on the morning of discharge.  TSH was  within normal limits at 0.750.   DISCHARGE MEDICATIONS:  1.  Alprazolam 1 mg p.o. q.h.s. p.r.n.  2.  Aspirin 81 mg p.o. daily.  3.  Calcium acetate with D 2 tablets daily.  4.  Citalopram 10 mg p.o. daily.  5.  Felodipine ER 10 mg p.o. daily.  6.  Furosemide 40 mg p.o. p.r.n.  7.  Levothyroxine 0.075 mg p.o. daily.  8.  Metoprolol 50 mg p.o. b.i.d. (note this is a new dosage).  9.   Nitroglycerin 0.4 mg sublingually as needed.  10. Plavix 75 mg p.o. daily.  11. KCl 20 mEq by mouth daily.  12. Primidone 25 mg p.o. b.i.d.  13. Senokot S 2 tablets daily p.r.n.  14. Zocor 20 mg p.o. q.h.s.  15. Vicodin 5/500 1-2 q.4-6h. p.r.n. pain.  16. Zyrtec 10 mg p.o. daily.  17. Protonix 40 mg p.o. b.i.d.  18. Imdur 30 mg p.o. daily (note this is a new medication).   BRIEF HISTORY OF PRESENT ILLNESS:  [name] is an 77 year old female, with  a history of hypertension, hyperlipidemia, coronary artery disease, status  post multiple stents as well as recently diagnosed paroxysm atrial  fibrillation, who presented with complaints of diffuse chest pain radiating  to the neck, associated with shortness of breath as well as palpitations.  The episodes would last for a few seconds and  was felt to be atypical chest  pain.  The patient had recently been restarted on metoprolol the week prior  to admission for her history of paroxysm atrial fibrillation.  The patient  also was complaining of worsening epigastric pain for the past couple of  weeks.  The patient's point of care enzymes were negative and her EKG showed  no change.  She was admitted to the [facility] Teaching Service to rule  out MI given her extensive history of coronary artery disease as well as to  watch her monitor in case she was in atrial fibrillation.   HOSPITAL COURSE:  Problem 1:  CHEST PAIN:  The patient's cardiac enzymes  were negative x3.  The patient with known history of coronary artery  disease, status post multiple stents.  She was continued on aspirin, Plavix  and beta blocker.  She was placed on heparin and nitroglycerin at the  beginning of admission but this was discontinued after her cardiac enzymes  were negative.  The patient was started on Imdur 30 mg while in the  hospital.  She was not orthostatic on this medication.  The patient's chest  pain was much improved at the time of discharge.  Chest  pain felt likely  secondary to anxiety versus GERD at the time of discharge.   Problem 2:  PALPITATIONS:  The patient's beta blocker was increased to  metoprolol 50 mg p.o. b.i.d. on admission per cards.  The patient was  without any atrial fibrillation on exam or on monitor.  The patient felt  that her palpitations were much improved with the increase in medication.  The patient's TSH was checked and was within normal limits.   Problem 3:  GI:  The patient with complaints of midepigastric burning.  The  patient was started on Protonix 40 mg b.i.d. which is increased from her  home medication of omeprazole 20 mg twice daily.  The patient's heartburn  was improved on the morning of discharge.  The patient will continue  Protonix 40 mg p.o. b.i.d.  The patient also complains of dysphagia which is  not new and has been evaluated extensively in the past.  No workup is done  for this during this admission.   Problem 4:  HYPERTENSION:  The patient continued on felodipine also with  increase dose of metoprolol.  Blood pressures were at goal throughout the  hospitalization.   Problem 5:  ESSENTIAL TREMOR:  The patient had started Primidone next week.  She is stable on this medication, although still with some tremor this  hospitalization.  The patient will continue this medication on discharge.   Problem 6:  HYPOTHYROIDISM:  TSH within normal limits.  We will continue  Synthroid 75 mcg once daily.   Problem 7:  HYPERLIPIDEMIA:  LDL was 58 at last admission.  The patient  continued on Zocor 20 mg before bed.   Problem 8:  ANEMIA:  The patient's hemoglobin at baseline throughout this  hospital course.   Problem 9:  DVT PROPHYLAXIS:  The patient was on heparin drip for a portion  of the hospitalization.  She was ambulating afterwards.   Problem 10:  COPD:  The patient was without signs or symptoms of COPD this hospitalization.  Spiriva had been discharged the week prior to admission  and  she did not require any treatment for COPD.   Problem 11:  HISTORY OF ANXIETY:  The patient is stable during this hospital  course.  Continue Citalopram 10  mg once daily.   FOLLOW UP:  Follow up with Dr. Sheffield Slider.  Phone number [phone] on February 21, 2006 at 11:45 a.m. as well as Dr. Clarene Duke.  Phone number [phone] on February 23, 2006 at 10 a.m. for stress test and then she has another follow-up  appointment with Dr. Clarene Duke on Mar 02, 2006 at 10:15 a.m.      Benn Moulder, M.D.    ______________________________  Arnette Norris. Sheffield Slider, M.D.    MR/MEDQ  D:  02/16/2006  T:  02/17/2006  Job:  [id number]   cc:   Deniece Portela A. Sheffield Slider, M.D.  Fax: [phone]   Thereasa Solo. Little, M.D.  Fax: [phone]

## 2011-03-18 NOTE — Op Note (Signed)
   NAME:  BLYSS, LUGAR                        ACCOUNT NO.:  [id number]   MEDICAL RECORD NO.:  [id number]                   PATIENT TYPE:  INP   LOCATION:  5707                                 FACILITY:  [facility]   PHYSICIAN:  James L. Malon Kindle., M.D.          DATE OF BIRTH:  03/05/1917   DATE OF PROCEDURE:  01/01/2003  DATE OF DISCHARGE:                                 OPERATIVE REPORT   PROCEDURE:  Colonoscopy.   INDICATIONS:  Gastrointestinal bleed, suspect lower intestinal with some  cramping.   DESCRIPTION OF PROCEDURE:  The procedure had been explained to the patient  and consent was obtained. The patient was placed in the left lateral  decubitus position and the Olympus pediatric adjustable scope was inserted  and advanced. The prep was quite good.   We were able to advance to the cecum after passing the sigmoid colon. She  had extensive diverticular disease of the sigmoid colon but no active  bleeding. The ileocecal valve was seen. The scope was withdrawn and the  cecum and ascending colon, hepatic flexure, transverse colon, splenic  flexure, and descending colon were seen well. No bleeding whatsoever, no  blood was seen  in the descending or sigmoid colons due to the extensive  diverticular disease.   The scope was withdrawn down to the rectum and the rectum was free of  lesions other than internal hemorrhoids. The internal hemorrhoids were not  bleeding.   The scope was withdrawn. The patient tolerated the procedure well.   ASSESSMENT:  Marked diverticulosis with moderate to large internal  hemorrhoids.   PLAN:  Will treat with fiber supplements, stool softeners and observe  clinically.                                               James L. Malon Kindle., M.D.    Waldron Session  D:  01/01/2003  T:  01/01/2003  Job:  [id number]   cc:   William A. Hensel, M.D.  [address] [place]  [place] [address]  Fax: [phone]

## 2011-03-18 NOTE — Discharge Summary (Signed)
NAME:  [name], [name]                        ACCOUNT NO.:  [id number]   MEDICAL RECORD NO.:  [id number]                   PATIENT TYPE:  INP   LOCATION:  5707                                 FACILITY:  [facility]   PHYSICIAN:  Santiago Bumpers. Hensel, M.D.             DATE OF BIRTH:  1917/08/07   DATE OF ADMISSION:  12/30/2002  DATE OF DISCHARGE:  01/02/2003                                 DISCHARGE SUMMARY   CONSULTATION:  James L. Randa Evens, M.D., Gastroenterology.   DISCHARGE DIAGNOSES:  1. Lower gastrointestinal bleed.  2. Internal hemorrhoids.  3. Diverticulosis.  4. Chronic constipation.  5. Hypothyroidism.  6. Hypertension.   DISCHARGE MEDICATIONS:  1. Atenolol 25 mg one p.o. daily.  2. Ativan 1 mg one b.i.d. and 1 mg q.h.s. p.r.n.  3. Calcium acetate with vitamin D two tablets daily.  4. Celexa 20 mg daily.  5. Furosemide 40 mg one half tablet daily.  6. Gemfibrozil 600 mg b.i.d.  7. Isosorbide dinitrate ER 30 mg daily.  8. K-Dur 10 mEq b.i.d.  9. Lanoxin 0.125 mg daily.  10.      Lipitor 20 mg one half tablet daily.  11.      Meclozine 25 mg one tablet q.6h. p.r.n.  12.      Nexium 40 mg two tablets daily.  13.      Nitroglycerin 0.4 mg p.o. daily.  14.      Percocet 5/375 mg one to two q.4h. p.r.n. max four daily.  15.      Rhinocort one spray in each nostril daily.  16.      Sorbitol one to two tablespoons b.i.d.  17.      Synthroid 0.075 mg daily.  18.      Tums Ultra three to four daily with food.  19.      Vitamin B injection q. month.  20.      New medications include:     a. MiraLax powder 17 g per mouth as directed x2 weeks.     b. Colace 100 mg 10 cc liquid b.i.d.   DISCHARGE INSTRUCTIONS:  The patient is to hold her aspirin until bleeding  resolved and follow up with her primary M.D.  She may hold the Senokot S  while she is on the MiraLax.  She is instructed to eat plenty of fruits and  vegetables and drink six to eight glasses of water daily.  For any  concerns  or additional bleeding, she is to call her doctor.   FOLLOW UP:  A follow-up appointment has been made with Dr. Sheffield Slider on January 07, 2003, at 11:15.   HOSPITAL COURSE:  The patient is an 77 year old Caucasian female with  multiple medical problems who presented with intermittent rectal bleeding  since 4 p.m. on the day of admission.  Her first episode began when she felt  a fullness in her rectum and had the  urge to have a bowel movement.  She  reported passing small clots of bright red blood in the toilet.  She has a  history of chronic constipation, hemorrhoids, and diverticulosis with a  previous episode of bleeding.  But she said this episode of rectal bleeding  was much worse today than she had experienced in the past.  She denied any  lightheadedness, dizziness, or chest pain.  She did, however, have  occasional shortness of breath that was not new.  She has chronic abdominal  pain without nausea, vomiting, or diarrhea.  She denied any dysphagia or  history of bleeding disorders.  She weakness currently taking aspirin 81 mg  daily.   Vital signs on admission showed atrial fibrillation temperature with a blood  pressure of 136/66, pulse 70, respiratory rate 20, 98% on room air.  She  appeared to be in no acute distress.  Lungs were significant for crackles at  the bases.  Abdomen was soft, diffusely tender on palpation with nonreactive  bowel sounds.  Her stool was Hemoccult positive.   Her labs on admission showed a hemoglobin of 11.9.  PT, INR and PTT were all  within normal ranges.  A chemistry panel was also normal.  Two view of the  abdomen showed cardiac enlargement, COPD, prominent stool throughout the  colon, likely due to constipation.   PROBLEM #1 -  GASTROINTESTINAL BLEEDING:  Aspirin was held and the patient  was watched for any hemodynamic instability.  The patient's hemoglobin  remained stable and she did not require any blood transfusions.  Blood  pressure  remained normotensive.  Orthostatic blood pressures were obtained  and were negative.  Dr. Randa Evens was consulted for possible EGD colonoscopy  due to her past colonoscopy four years ago performed by Dr. Randa Evens.  Colonoscopy was performed on January 01, 2003, showing no active bleeding,  large internal hemorrhoids, and multiple diverticulosis.  It was recommended  by gastroenterology to increase fiber in her diet and relieve constipation.  MiraLax was added as a discharge medication.  The patient will remain on  this medication for two weeks as well as continue her home dose of Sorbitol.  She will also be started on Colace for a stool softener two times daily.  She is encouraged to increase fiber in her diet as well as her fluid intake.  Bleeding likely related to constipation related hemorrhoidal bleeding as  well as diverticular bleeding.  The patient remained stable during her  hospitalization.   PROBLEM #2 -  MULTIPLE MEDICAL PROBLEMS:  These remained stable.  TSH was  checked due to her constipation and it was 1.529.    FOLLOWUP ITEMS:  Continue GI bleeding in compliance with her MiraLax and  Colace as well as the Sorbitol given that she is taking Percocet daily.     Lorne Skeens, D.O.                         William A. Leveda Anna, M.D.    Erick Alley  D:  01/02/2003  T:  01/03/2003  Job:  [id number]   cc:   Fayrene Fearing L. Malon Kindle., M.D.  [address], [address]  [place]  [place] [address]  Fax: [phone]   Deniece Portela A. Sheffield Slider, M.D.  [address] [place]  [place] [address]  Fax: [phone]

## 2011-03-18 NOTE — Consult Note (Signed)
[place]. [facility]  Patient:    [name]                      MRN: [id number] Proc. Date: 11/03/99 Adm. Date:  [id number] Attending:  Sanjuana Letters Dictator:   Gloris [place]. Lazarus Salines, M.D. CC:         Santiago Bumpers. Leveda Anna, M.D.             Gloris Manchester. Lazarus Salines, M.D.                          Consultation Report  CHIEF COMPLAINT:  Left thyroid mass with pain.  HISTORY OF PRESENT ILLNESS:  This 77 year old white female has had a longstanding thyroid goiter.  She had had some sort of partial thyroidectomy in 1973.  Since  that time she has had enlargement in the thyroid on both sides which has been basically slow-growing if at all and involving minimal symptoms.  The patient does follow with Dr. Tivis Ringer, her primary internist, for this and her other problems.  Roughly one week ago she developed increasing swelling in the left lower neck in the thyroid region involving some discomfort both externally and also with swallowing.  No actual difficulty with swallowing and no aspiration.  The neck ass was quite tender and is not spontaneously getting slightly less tender.  There as no external skin change nor internal or external drainage.  She was producing a  little bit of blood from her nose and coughing up a little bit of the same material but no change in voice, and no difficulty breathing.  She has never had cancer. No other neck masses.  She was admitted to the hospital yesterday for evaluation of the mass, pain, and to make sure that everything else was stable.  PAST MEDICAL HISTORY:  The patient was on Coumadin for DVT roughly four months go. She has a history of heart attack and congestive failure.  PHYSICAL EXAMINATION:  GENERAL:  This is a mentally alert old white female in no apparent distress. She has a solid lunch in front of her and is eating it, complaining more of the lack of appetite than of difficulty swallowing.  Mental  status seems acute and appropriate. She hears all conversational speech.  Voice is clear and respirations unlabored. She does have visible swelling in the thyroid region without any overlying skin  changes or discoloration.  HEENT:  Head atraumatic.  Both ear canals are clear with aerated drums and with  normal configuration.  Cranial nerves intact.  Anterior nose slightly dry with ome bloody excoriation on the right anterior septum consistent with winter drying.  Oral cavity is edentulous with moist membranes.  Oropharynx clear.  Nasopharynx  easily seen with normal eustachian tori and normal mucosa.  Hypopharynx seen reasonably well with mobile vocal chords.  No pooling in vallecular piriformis. No lesions.  NECK:  Reveals a low transverse scar which is well healed.  There is enlargement of the right lobe of the thyroid and also some fatty tissue overlying which makes t appear even more enlarged, which is rubbery, firm, with no focal discrete masses, and with no tenderness.  The left lobe of the thyroid is maybe 5 cms in hemispherical, slightly firmer, slightly more tender, but not exquisite.  There is no overlying skin change.  There are no other neck masses noted.  X-RAY:  CT scan was performed yesterday, which  I will review.  IMPRESSION: 1.  Probable acute hemorrhage into a preexisting thyroid nodule in her known     thyroid goiter.  Coumadin may have contributed to this.  I doubt either     acute, subacute, or suppurative thyroiditis given lack of diffuse enlargement     of the gland or any truly septic symptoms.  I think it is appropriate to     rule out anaplastic carcinoma given the very rapid change although I doubt     that this is the case.  PLAN: I think thyroid ultrasound with fine needle aspiration would be a reasonable way to assess the left thyroid mass and make sure we are not dealing with anything which might suggest malignancy.  I will review the CT  scans to make sure that I can get a better feeling for the size of this mass and location.  In light of the patients advanced age (77) and medical conditions including DVT and congestive failure she is not a good operative candidate.  I think if this is nothing more than acute hemorrhage into a nodule which gave her some discomfort, some relative dysphagia but no airway compromise, that when the hemorrhage resorbs she will revert to baseline, basically asymptomatic status, in which case I am ot sure I can advocate for a major surgical undertaking to try to remove this enlarged goiter.  I will follow with you as the data base becomes more complete and discuss accordingly.  The patient understands and agrees with the plans. DD:  11/03/99 TD:  11/03/99 Job: [id number]

## 2011-03-18 NOTE — Discharge Summary (Signed)
[place]. [facility]  Patient:    [name]                      MRN: [id number] Adm. Date:  [id number] Disc. Date: [id number] Attending:  Sanjuana Letters Dictator:   Patricia Pesa, M.D.                           Discharge Summary  DISCHARGE DIAGNOSES: 1. Acute hemorrhage into pre-existing thyroid nodule. 2. Neck pain. 3. Hypertension. 4. Chronic obstructive pulmonary disease. 5. Anticoagulation.  CHRONIC PAST MEDICAL HISTORY:  1. History of goiter.  2. Hypothyroidism.  3. Anemia.  4. Anxiety.  5. Tremor.  6. Atherosclerosis.  7. Atrial fibrillation.  8. STDVT.  9. GERD. 10. Diverticulosis of the colon. 11. Osteoarthritis. 12. Bursitis.  DISCHARGE MEDICATIONS:  1. Percocet one p.o. q.4h. p.r.n.  2. Ativan 0.5 mg q.a.m., 1 mg q.p.m.  3. Calcium 500 mg b.i.d.  4. Digoxin 0.125 mg 1-1/2 tablets p.o. q.d.  5. Imdur 30 mg p.o. q.d.  6. K-Dur two tablets p.o. q.d.  7. Lasix 20 mg p.o. q.d.  8. Nitroglycerin 0.4 mg sublingual p.r.n.  9. Prevacid 30 mg p.o. q.d. 10. Sorbitol one or two tablespoons p.o. b.i.d. 11. Synthroid 0.1 mg p.o. q.d. 12. Tenormin 25 mg p.o. q.d. 13. Vitamin B12 shots q. month.  CONSULTATION:  Dr. Zola Button T. Wolicki in otolaryngology.  HISTORY OF PRESENT ILLNESS:  The patient is an 77 year old white female who presented to the clinic complaining of left anterior neck pain and dysphagia. he has a history of a chronic right thyroid mass.  On examination the patient was afebrile with stable vital signs.  She was in moderate distress.  The examination was remarkable for ptosis on the left and the left pupil was 1.0 mm smaller than the right.  The neck revealed a 4.0 cm x 6.0 cm firm fixed left neck mass and a 3.0 cm x 4.0 cm soft right-sided neck mass.  LABORATORY DATA:  WBC 8.4.  A CT of the neck revealed very large thyroid masses with cystic and solid components, left greater than right.  HOSPITAL  COURSE: #1 - THYROID MASS:  The patient was admitted and given Percocet for pain control. An ENT consultation was obtained.  It was felt that the neck mass represented an acute hemorrhage into the pre-existing thyroid nodule.  It was not felt to be neoplastic.  It was noted that the patients INR was 3.5.  Coumadin was held.  A  TSH was 0.494 with a free T4 of 1.37.  The patient was monitored throughout her  hospital course.  She continued to improve.  Her neck mass reduced in size. She had less throat pain and was eating a general diet.  At the time of discharge she had minimal neck pain.  She had no difficulty swallowing, no respiratory complaints, and significantly decreased mass in the left neck.  #2 - HYPERTENSION:  Remained stable on Tenormin.  #3 - CHRONIC OBSTRUCTIVE PULMONARY DISEASE:  Stable throughout the hospitalization.  #4 - ANTICOAGULATION:  Coumadin was held.  She was to follow up at the [facility] for INRs.  DISCHARGE INSTRUCTIONS:  The patient is to call or return for any increase in the neck mass, difficulty swallowing, or any difficulty breathing.  FOLLOWUP:  The patient is to follow up with Dr. Zola Button T. Wolicki if needed. She has a follow-up  appointment with Dr. Deniece Portela A. Sheffield Slider at the [facility]. DD:  12/01/99 TD:  12/01/99 Job: [id number]

## 2011-03-18 NOTE — Op Note (Signed)
[place]. [facility]  Patient:    Ana, Clements                     MRN: [id number] Proc. Date: 03/08/00 Adm. Date:  [id number] Attending:  Burnard Bunting                           Operative Report  PREOPERATIVE DIAGNOSIS:  Right hip intertrochanteric fracture.  POSTOPERATIVE DIAGNOSIS:  Right hip intertrochanteric fracture.  PROCEDURE:  Right hip intertrochanteric fracture open reduction/internal fixation.  SURGEON:  Graylin Shiver. August Saucer, M.D.  ASSISTANT:  Veverly Fells. Ophelia Charter, M.D.  ANESTHESIA:  Spinal.  ESTIMATED BLOOD LOSS:  100.  DRAINS:  None.  INDICATIONS:  [name] is an 77 year old patient, who fractured her right hip.  Fractured right hip on the day of surgery.  She presents now for open reduction/internal fixation.  PROCEDURE IN DETAIL:  Patient was brought to the operating room, where _________ anesthesia was induced.  Preoperative IV antibiotics were administered.  The patient was placed on the [facility] fracture table with the peroneal region and left leg well-padded.  Fracture reduction was performed with a combination of internal rotation and traction.  Fracture reduction was confirmed in the A/P and lateral planes under fluoroscopy.  Right hip was then prepped with Betadine and Duraprep solution and draped in a sterile manner. An incision along the lateral aspect of the thigh and at the level of the femur was then made.  Skin and subcutaneous tissue was sharply divided. Fascia lata was divided.  Bleeding points encountered were controlled using electrocautery.  The vastus lateralis was raised and elevated away from the lateral aspect of the femur, but one fingerbreadth below the vastus ridge, 3/8ths inch drill bit was used to make the starting hole for the guidepin. The guidepin was then utilized and was placed into the center of the femoral head on both the A/P and lateral planes.  After obtaining measurement, the femoral neck and  head were reamed and tapped.  Appropriate sized screw was then placed into the head with reduction maintained in both the A/P and lateral planes.  A 5-hole side plate was applied and affixed with five bicortical screws in standard AO fashion.  Traction was released and a compression screw was utilized to compress the fracture.  The compression screw was removed.  Incision was then irrigated.  Fracture reduction and hardware placement was adequate as judged in the A/P and lateral planes.  The incision was then closed using 0 Vicryl to reapproximate the tensor fascia lata and then a 2-0 Vicryl and skin staples to reapproximate the skin edges. Patient was then transferred to the bed in stable condition. DD:  03/13/00 TD:  03/13/00 Job: [id number]

## 2011-03-18 NOTE — Discharge Summary (Signed)
[place]. [facility]  Patient:    [name], [name]                     MRN: [id number] Adm. Date:  [id number] Disc. Date: 03/14/00 Attending:  Burnard Bunting                           Discharge Summary  DISCHARGE DIAGNOSIS:  Right hip fracture.  SECONDARY DIAGNOSES:  1. Hypothyroidism.  2. Anemia.  3. Anxiety.  4. Hypertension.  5. Coronary artery disease.  6. Atrial fibrillation.  7. Chronic obstructive pulmonary disease.  8. Gastroesophageal reflux disease.  9. Diverticulosis. 10. History of deep venous thrombosis. 11. Osteoarthritis. 12. Bursitis of the shoulder.  OPERATIONS/PROCEDURES:  Right hip open reduction and internal fixation of intertrochanteric fracture performed Mar 08, 2000.  HISTORY OF PRESENT ILLNESS:  [name] is an 77 year old community ambulator who lives in an apartment.  She slipped on the grass on Mar 08, 2000 and fractured her right hip.  No other orthopedic complaints.  MEDICATIONS:  Percocet, Ativan, calcium, digoxin, Imdur, K-Dur, Lasix, sublingual nitroglycerin, Sorbitol, Synthroid, Tenormin, vitamin B12.  PHYSICAL EXAMINATION:  GENERAL:  The patient is alert and oriented.  HEENT:  Normal.  CHEST:  Clear to auscultation.  CARDIAC:  Regular rate and rhythm.  EXTREMITIES:  The patient has had prior right shoulder surgery.  Pulses are trace palpable in both feet.  Dorsiflexion and plantar flexion of each foot is intact.  Left hip has nonpainful range of motion.  Right hip is painful.  X-rays demonstrate intertrochanteric fracture.  HOSPITAL COURSE:  The patient was taken to the OR on Mar 08, 2000 at which time open reduction and internal fixation of the right hip was performed.  The patient tolerated the procedure well without immediate complications.  She was then started on physical therapy on postoperative day #1, touch down weightbearing right lower extremity.  She was seen by the internal  medicine consultants to help manage her history of coronary artery disease.  She was started on Coumadin for DVT prophylaxis.  Plain x-rays demonstrated good alignment and position of the hardware post surgery.  She had an otherwise unremarkable course.  Incision was intact at the time of discharge.  She received one unit of packed red blood cells transfusion for hematocrit of 27. The patient is transferred to skilled nursing facility on Mar 14, 2000.  There she will receive further physical therapy.  She will follow up with me in one week for staple removal.  She will continue to be touch down weightbearing She is discharged in good condition.  DISCHARGE MEDICATIONS:  Preadmission medications plus Vicodin one to two p.o. q.3-4h. p.r.n. pain. DD:  03/14/00 TD:  03/14/00 Job: [id number]

## 2011-03-18 NOTE — Discharge Summary (Signed)
NAMEPERLA, ECHAVARRIA NO.:  [id number]   MEDICAL RECORD NO.:  [id number]          PATIENT TYPE:  INP   LOCATION:  3733                         FACILITY:  [facility]   PHYSICIAN:  Ursula Beath, MD  DATE OF BIRTH:  25-Oct-1917   DATE OF ADMISSION:  03/24/2005  DATE OF DISCHARGE:                                 DISCHARGE SUMMARY   DISCHARGE DIAGNOSES:  1.  Diarrhea.  2.  Dehydration.  3.  Abdominal pain.  4.  Rule out myocardial infarction.  5.  Atrial fibrillation.  6.  Hypertension.   DISCHARGE MEDICATIONS:  1.  Norvasc 5 mg one p.o. daily.  2.  Celexa 20 mg one p.o. daily.  3.  K-Dur 10 mEq one p.o. daily.  4.  Aspirin 81 mg one p.o. daily.  5.  Vicodin 5/500 mg 1-2 tabs q.6h. p.r.n.  6.  Lasix 20 mg p.o. daily.  7.  Synthroid 75 mcg one p.o. daily.  8.  Alprazolam 1 mg one at bedtime p.r.n.  9.  Imdur 60 mg one p.o. daily.  10. Omeprazole 20 mg p.o. daily.  11. Plavix 75 mg one p.o. daily.  12. __________ 25 mg one p.o. daily.  13. Zocor 40 mg one p.o. daily.  14. Senokot-S two daily p.r.n.  15. Calcium one p.o. at bedtime.  16. Zyrtec 10 mg one p.o. daily p.r.n.   DISCHARGE INSTRUCTIONS:  No restrictions, to resume a normal diet.   FOLLOWUP:  She is to follow up with Dr. Sheffield Slider in one week, to call Tuesday  for an appointment.   HOSPITAL COURSE:  Problem 1.  Diarrhea and marked dehydration.  Ms. Gossen  symptoms improved over the course of her hospital stay.  Her basic metabolic  panel was normal at the time of discharge and she was feeling much better.  She continued to have abdominal pain; however, this seems to be a functional  problem with her.   Problem 2.  Rule out myocardial infarction.  She was ruled out for an MI and  placed on telemetry.  She had cardiac enzymes that were negative x3.   Problem 3.  Atrial fibrillation.  She maintained a normal sinus rhythm  during her hospitalization.  She has been placed on telemetry and did well.   Problem 4.  Hypertension.  Her blood pressure was stable on her home  medications.   Problem 5.  Anxiety.  She was continued on alprazolam as needed at night.   Problem 6.  Hyperlipidemia.  She was continued on her Zocor.   Problem 7.  Hypothyroidism.  She was continued on her Levoxyl and a TSH was  within normal limits.   The patient was discharged to home in improved condition to the care of her  daughter-in-law.      JT/MEDQ  D:  03/26/2005  T:  03/26/2005  Job:  [id number]

## 2011-03-18 NOTE — H&P (Signed)
NAMEAMBERA, [name]              ACCOUNT NO.:  [id number]   MEDICAL RECORD NO.:  [id number]          PATIENT TYPE:  INP   LOCATION:  3027                         FACILITY:  [facility]   PHYSICIAN:  Melina Fiddler, MD DATE OF BIRTH:  07-23-1917   DATE OF ADMISSION:  12/24/2005  DATE OF DISCHARGE:                                HISTORY & PHYSICAL   CHIEF COMPLAINT:  Fever and shortness of breath.   HISTORY OF PRESENT ILLNESS:  [name] is an 77 year old lady with multiple  medical problems which will be outlined below who presents with a two-day  history of fever, nausea, cough, and feeling short of breath.  She reports  that she had her flu shot but she complains of body aches as well.  Her  symptoms worsened over the previous 12 hours.  She called the on-call  physician who asked her to come to the emergency department.  While in the  emergency department her temperature was 101.9 and she was only saturating  90% on room air and was wheezing quite significantly on physical examination  so I was asked to admit her.   REVIEW OF SYSTEMS:  Positive for fevers.  No chills, but does complain of  body aches.  Negative for chest pain.  Positive for shortness of breath.  Cough productive of yellow sputum and wheeze.  Positive for nausea and no  vomiting.  She has had recent diarrhea which is now improving.  She has  complained of some dysuria.  Denies a rash.  Complains of some chronic low  back pain.   PAST MEDICAL HISTORY:  1.  Hypothyroidism and goiter.  2.  Hyperlipidemia.  3.  Anemia.  4.  Anxiety.  5.  Tremor.  6.  Hypertension.  7.  Coronary artery disease.  8.  Atrial fibrillation.  9.  COPD.  10. Gastroesophageal reflux disease.  11. Diverticulosis.  12. Osteoarthritis.  13. Osteoporosis.  14. Pernicious anemia.  15. DVT.  16. A right lower lobe lung lesion noted on chest CT which has been      evaluated multiple times.   PAST SURGICAL AND PROCEDURAL HISTORY:  1.   Splenectomy.  2.  Angioplasty and stenting in 1999.  3.  Left rotator cuff repair.  4.  Cholecystectomy.  5.  Appendectomy.  6.  Cervical laminectomy.  7.  Partial thyroidectomy.  8.  D&C for benign causes.  9.  Coronary stenting in 2000.  10. ORIF of a right hip fracture.  11. CT scan showing a 6 mm right lower lobe lung lesion.  12. CT of the chest in 2005 showing an unchanged right lower lobe lung      lesion.  13. Coronary catheterization with stenting of the LAD and RCA in 2006 both      May and November.   MEDICATIONS:  1.  Alprazolam 1 mg q.h.s. p.r.n.  2.  Aspirin 81 mg daily.  3.  Calcium acetate with D two tablets daily.  4.  Citalopram 10 mg daily.  5.  Felodipine ER 10 mg daily.  6.  Furosemide 40 mg  daily.  7.  Levothyroxine 0.075 mg daily.  8.  Nitroglycerin 0.4 mg sublingual p.r.n.  9.  Omeprazole 20 mg b.i.d.  10. Plavix 75 mg daily.  11. Potassium chloride 20 mEq daily.  12. Propranolol 40 mg b.i.d.  13. Senokot-S two daily p.r.n.  14. Simvastatin 40 mg daily.  15. Vicodin 5/500 one to two tablets q.6h. p.r.n.  16. Zyrtec 10 mg daily.   ALLERGIES:  CODEINE, EFFEXOR, LIPITOR, CYMBALTA.   FAMILY HISTORY:  Son with coronary artery disease.  Daughter and sister both  with breast cancer.  Mother died age 88 from heart disease.  Father died age  30 from kidney disease.  Brother died age 67 from complications of  alcoholism.   SOCIAL HISTORY:  She lives alone.  She is widowed in 1976.  She does have a  history of smoking and occasionally use smokeless tobacco.  She is a  nondrinker.   PHYSICAL EXAMINATION:  VITAL SIGNS:  Temperature 101.9, heart rate 92, blood  pressure 152/78, respirations 18, saturation 90% on room air up to 96% on 2  L nasal cannula.  GENERAL:  She is in no acute distress and alert and oriented.  HEENT:  Pupils are equal, round, and reactive to light.  Extraocular motions  are intact.  Oropharynx is without erythema or exudate.  Her  tongue has a  brownish coating.  LUNGS:  She has scattered wheezes with only fair air movement.  No crackles  are noted but she does have scattered rhonchi.  CARDIOVASCULAR:  She seems to have a regular rhythm despite her atrial  fibrillation at this time.  ABDOMEN:  Soft, nontender, nondistended with normoactive bowel sounds.  EXTREMITIES:  She has no edema noted and 2+ distal pulses.  NEUROLOGIC:  Cranial nerves II-XII are grossly intact.  She has 5/5 strength  in her bilateral upper and lower extremities.   LABORATORIES:  Venous pH of 7.395, pCO2 of 43.1.  Sodium 133, potassium 4.1,  chloride 102, bicarbonate 26.4, BUN 14, creatinine 0.9, glucose 131.  Blood  cultures are pending.  Chest x-ray showed no acute disease, but COPD and  cardiomegaly.  She had a ultrasound which was pending.  CBC showed white  count 7.8, H&H 13 and 37.5, platelets 157.  85% neutrophils, 8% lymphocytes,  7% monocytes, 0 eos, 0 basos.  Influenza A,B antigen is pending.   ASSESSMENT/PLAN:  47.  77 year old female with shortness of breath, cough, and fever.  This is      concerning for community-acquired pneumonia versus bronchitis versus      chronic obstructive pulmonary disease exacerbation and possibly even      flu.  Given her age and comorbidities I think it is prudent to admit her      for inpatient therapies.  She is wheezing a lot so I am going to give      her nebulizers overnight.  She is not on any therapy for chronic      obstructive pulmonary disease chronically but it is listed as one of her      medical problems.  Because she has had sputum production and fever I am      going to treat this as a chronic obstructive pulmonary disease      exacerbation with steroids and antibiotics until more tests come back.      Will follow up on influenza antigen.  She did have a flu shot but with     her myalgias and high  fever it is certainly concerning for that.  If her      flu test is positive she is  still within the time frame that Tamiflu      would be appropriate so I will start a Tamiflu if that comes back      positive (please note that at the time of this dictation it was found      that the patient indeed tested positive for flu.  Her antibiotics and      steroids were stopped and she was started on Tamiflu).  2.  Coronary artery disease.  She has no chest pain currently so we will      continue her on Zocor, propranolol, aspirin, and Plavix.  3.  Dysuria.  Will follow up on her urinalysis.  4.  Back pain.  Appears to be chronic and no worse than usual.  Will      continue her home Vicodin.  5.  Hypothyroidism.  Continue her levothyroxine.  6.  Anxiety and depression.  Continue her alprazolam and citalopram.  7.  Hypertension.  Will continue her home medication regimen.  8.  History of atrial fibrillation.  She seemed to have a regular rhythm on      her physical examination.  She is not on any current anticoagulation      except for aspirin so I will continue this.  I suspect she must have      paroxysmal atrial fibrillation.  9.  History of deep venous thrombosis.  I am going to give her Lovenox for      prophylaxis.  10. Osteoporosis.  Continue calcium.      Ursula Beath, MD    ______________________________  Melina Fiddler, MD    JT/MEDQ  D:  12/26/2005  T:  12/26/2005  Job:  [id number]   cc:   Deniece Portela A. Sheffield Slider, M.D.  Fax: [phone]

## 2011-03-18 NOTE — Cardiovascular Report (Signed)
NAMEJOURNIEE, FELDKAMP              ACCOUNT NO.:  [id number]   MEDICAL RECORD NO.:  [id number]          PATIENT TYPE:  INP   LOCATION:  5503                         FACILITY:  [facility]   PHYSICIAN:  Madaline Savage, M.D.DATE OF BIRTH:  1917-06-29   DATE OF PROCEDURE:  09/01/2005  DATE OF DISCHARGE:                              CARDIAC CATHETERIZATION   PROCEDURES PERFORMED:  1.  Selective coronary angiography by Judkins technique.  2.  Left ventricular angiography.  3.  Retrograde left heart catheterization. No percutaneous coronary      intervention done.  4.  Right percutaneous femoral AngioSeal placement.   COMPLICATIONS:  None.   ENTRY SITE:  Right femoral.   DYE USED:  Omnipaque.   PATIENT PROFILE:  The patient is an 77 year old white female who is a  cardiology patient of Dr. Julieanne Manson who has had previous coronary  stenting in the past to mid-right coronary artery, proximal and mid-LAD and  proximal intermediate ramus branch of left circumflex. The patient entered  the hospital within the last 24 hours with nitrate responsive chest pain and  has negative cardiac enzymes and EKG's. She was brought to the cath lab  today to better define her anatomy in view of her symptoms. This procedure  was performed uneventfully and without complication.   RESULTS:   PRESSURES:  The left ventricular pressure was 165/5, end-diastolic pressure  20. Central aortic pressure was 175/80 with a mean of 123. These two  pressures were done at different times by pullback technique. There was less  than 10 mm of difference between the two pressures.   Left main coronary artery was short and patent.   LAD coursed to the cardiac apex and gave rise to a fairly large diagonal  branch arising fairly proximally. There was a radio-opaque stent in the  proximal LAD which span the LAD at a point where a diagonal branch took off.  There did not appear to be any jailing of the diagonal branch. That  stent in  the LAD proximally showed some luminal irregularities and perhaps 20% in-  stent restenosis which has been documented on previous recent  catheterizations. A stent placed since the last catheterization in the mid-  LAD was widely patent. There was TIMI III flow throughout the diagonal and  the LAD.   The left circumflex was nondominant. There was an intermediate ramus branch  arising from the left main coronary artery that contained a radio-opaque  stent that was widely patent. That vessel was medium in size. The left  circumflex was nondominant and it appeared patent.   The right coronary artery was a large and dominant vessel with a posterior  descending and three posterolateral branches distally. The entirety of the  RCA was patent including the midportion of the vessel near the takeoff of an  acute marginal branch that had no jailing of that marginal branch by the  patent stent that was in place there.   The left ventricular angiogram showed good contractility of all wall  segments. Ejection fraction estimate 60% and no evidence of mitral  regurgitation.  FINAL DIAGNOSES:  1.  Angiographic patent stents to four locations as described above.      1.  Mid-right coronary artery.      2.  Proximal left anterior descending artery.      3.  Mid-left anterior descending artery.      4.  Patent proximal intermediate ramus branch of left main.  2.  Normal left ventricular systolic function.  3.  Trivial in-stent restenosis of the proximal left anterior descending      artery stent.   RECOMMENDATIONS:  The patient had an AngioSeal which worked well for  vascular closure. Because of her age and the lateness of the hour, I would  recommend an overnight stay and discharge in the morning assuming all goes  well. She will need a follow-up with her primary cardiologist who is Dr.  Gaspar Garbe B. Little.           ______________________________  Madaline Savage, M.D.      WHG/MEDQ  D:  09/02/2005  T:  09/03/2005  Job:  [id number]   cc:   Asencion Partridge, M.D.  Fax: [phone]   Thereasa Solo. Little, M.D.  Fax: [phone]   [facility]   Medical Records [facility]

## 2011-03-18 NOTE — Cardiovascular Report (Signed)
[place]. [facility]  Patient:    [name], [name]                     MRN: [id number] Proc. Date: 01/11/01 Adm. Date:  [id number] Attending:  Sanjuana Letters CC:         Cardiac Catheterization Laboratory  Thereasa Solo. Little, M.D.  William A. Leveda Anna, M.D.  [facility], [address] [place], [place] [address]   Cardiac Catheterization  INDICATIONS:  [name] is an 77 year old white female, admitted January 09, 2001, with unstable angina.  She had multiple procedures in the past including LAD stenting by Dr. Caprice Kluver.  She had diagnostic arteriography performed in August of 2000 with a flow wire demonstrating no evidence of significant gradient, and medical therapy was recommended.  She had a 50% right coronary lesion at that time.  She ruled out for myocardial infarction and was treated medically.  She presents now for diagnostic coronary arteriography.  DESCRIPTION OF PROCEDURE:  The patient was brought to the second floor cardiac catheterization lab in the postabsorptive state.  She was premedicated with p.o. Valium.  Her right groin was prepped and shaved in the usual sterile fashion.  Xylocaine 1% was used for local anesthesia.  A 6 French sheath was inserted into the right femoral artery using standard Seldinger technique.  A 6 French right and left Judkins diagnostic catheter as well as a 6 French pigtail catheter were used for selective coronary angiography, left ventriculography, and distal abdominal aortography.  Omnipaque dye was used for the entirety of the diagnostic case.  Retrograde, aortic, left ventricular and pullback pressures were recorded.  HEMODYNAMICS: 1. Aortic systolic pressure 176, diastolic pressure 174. 2. Left ventricular systolic pressure 189 and diastolic pressure 21.  SELECTIVE CORONARY ANGIOGRAPHY: 1. Left main:  Normal. 2. Left anterior descending:  The proximal LAD stent was widely  patent as was    the diagonal branch. 3. Ramus intermedius:  Moderate sized vessel without significant disease. 4. Left circumflex:  Nondominant vessel without significant disease. 4. Right coronary artery:  Large dominant vessel with 70% smooth tapered    stenosis in the midportion.  LEFT VENTRICULOGRAPHY:  The RAO left ventriculogram was performed using 20 cc of Omnipaque dye a 10 cc/sec.  The overall LVEF was estimated greater than 60% without focal wall motion abnormalities.  DISTAL ABDOMINAL AORTOGRAPHY:  Distal abdominal aortogram was performed using 20 cc of Omnipaque dye at 20 cc/sec.  The renal arteries are widely patent. The infrarenal abdominal aorta and iliac bifurcation appear free of significant atherosclerotic changes.  IMPRESSION:  [name] has a patent left anterior descending stent with what appears to be a significant stenosis in the mid dominant right.  We will proceed with percutaneous coronary intervention using glycoprotein IIb/IIIa inhibition as adjunctive therapy.  PROCEDURE:  The existing 6 French sheath in the right femoral artery was exchanged over a wire for a 7 [place] sheath.  A 6 French sheath was then inserted in the right femoral vein.  Hexabrix dye was use for the entirety of the intervention.  Retrograde aortic pressure was monitored during the case. The patient did receive aspirin prior to the intervention and had already received Plavix this morning.  A total of 3000 units of heparin were administered and the ACT documented at 253.  Integrilin double bolus and infusion was administered.  Using a 7 [place] guide catheter with side holes as well as  a 0.014, 190 sport wire and a 2.5 x 15 mm long Doctor, general practice balloon, pre-dilatation was performed at nominal pressures.  The patient did have significant ST segment elevation with balloon inflation which quickly resolved with balloon deflation.  Following this, a Penta 3.0 x 15 mm stent  was then deployed at 14 atmospheres.  A total of 200 mcg of intracoronary nitroglycerin were administered twice.  The final angiographic result with reduction of a 70% mid dominant right coronary artery lesion to 0% residual with TIMI-3 flow. The patient tolerated the procedure well.  The sheaths were sewn securely in place.  The guide catheter and wires were removed.  The patient left the lab in stable condition.  Plans will be to discontinue heparin and remove the sheath once the ACT falls below 150.  Integrilin will continue for 18 hours.  She will be treated with aspirin and Plavix.  She will most likely be ready for discharge sometime tomorrow if she remains clinically stable overnight. DD:  01/11/01 TD:  01/11/01 Job: [id number]

## 2011-03-18 NOTE — Discharge Summary (Signed)
[place]. [facility]  Patient:    [name], [name]                     MRN: [id number] Adm. Date:  [id number] Disc. Date: 01/12/01 Attending:  Sanjuana Letters Dictator:   [name], M.D. CC:         [name], M.D.   Discharge Summary  ADMISSION DIAGNOSIS:  Chest pain.  DISCHARGE DIAGNOSIS:  Chest pain.  CHRONIC PROBLEM LIST:  1. Coronary artery disease.  2. Hypothyroidism.  3. Hypertension.  4. Atrial fibrillation.  5. Chronic obstructive pulmonary disease.  6. Gastroesophageal reflux disease.  7. Osteoarthritis.  8. Anxiety.  9. Depression.  DISCHARGE MEDICATIONS:  1. Tenormin 25 mg one tablet daily.  2. Aspirin 81 mg daily.  3. Celexa 20 mg q.d.  4. Lanoxin 0.125 mg daily.  5. Imdur 30 mg one tablet daily.  6. K-Dur 40 mEq daily.  7. Synthroid 100 mcg daily.  8. Tricor 200 mg daily.  9. Lasix 40 mg daily. 10. Prilosec 40 mg daily. 11. Ativan 0.5 mg in the morning and 1 mg in the evening.  SERVICE:  Family Practice Teaching Service  RESIDENT:  [name], M.D.  INTERN:  [name], M.D.  CONSULTATIONS:  [place].  The patients cardiologist is [name] but he was on vacation.  The cardiologist covering for him was [name].  Dr. Nanetta Clements evaluated the patient.  He determined that the patient needed a cardiac catheterization and took the patient to the catheterization lab on January 11, 2001.  PROCEDURES:  Cardiac catheterization;  Dr. Nanetta Clements performed the cardiac catheterization on January 11, 2001, and placed a right coronary artery stent.  There was no significant stenosis of the previously placed LAD stent. Recommendations were to discharge home on aspirin or Plavix and follow up with him in his clinic in two to three weeks.  HISTORY OF PRESENT ILLNESS:  For complete history and physical, please see intern H&P in chart.  Briefly, this is an 77 year old white female with  known coronary artery disease who presented with chest pain with typical and atypical anginal features.  She was admitted for rule out myocardial infarction and further cardiac evaluation.  HOSPITAL COURSE: #1 - CARDIAC:  Cardiac enzymes were obtained and were negative x 3.  The patient was placed on telemetry and did not have any notable arrhythmias. Electrocardiograms showed normal sinus rhythm with poor R-wave progression but no significant ST or T-wave changes.  Given the patients history and knowing that it had been almost two years since evaluation of her coronary artery anatomy, Clements was consulted and she was taken to the catheterization lab.  The results are as noted in the procedure section.  The patient did not have any further chest pain in the hospital that was cardiac in nature.  She did have some left inframammary tenderness and left side rib pain intermittently.  #2 - INFECTIOUS DISEASE:  The patient did report a fever of 102 prior to admission and had some mild intermittent abdominal pain and URI symptoms.  Her white blood cell count on admission was 6.3, her urine showed no signs of infection and she remained afebrile during the hospitalization.  It is possible that she had a viral syndrome just prior to admission, but had no significant signs of an infective process during this hospitalization.  PERTINENT LABORATORY DATA:  Basic metabolic panel on the day of discharge showed a BUN  of 11 and creatinine of 0.8.  The pH was 1.66.  A digoxin level was 0.6 on the day of admission.  DISPOSITION:  The patient was discharged to home with instructions to gradually increase her activity as tolerated.  She was going home with her daughter-in-law and was to get some close care and attention by family members for the first several days after discharge.  She was going to live at home after discharge.  It was recommended that she try to eat a low fat given her cholesterol  status and she was arranged to have follow-up with [name] at [facility] on January 23, 2001, at 12:20 p.m. to see [name] assistant.  She was also instructed to keep her appointment with [name] for January 17, 2001, that she had already planned. DD:  01/12/01 TD:  01/12/01 Job: [id number]

## 2011-03-18 NOTE — Consult Note (Signed)
NAME:  [name], [name]                        ACCOUNT NO.:  [id number]   MEDICAL RECORD NO.:  [id number]                   PATIENT TYPE:  INP   LOCATION:  5707                                 FACILITY:  [facility]   PHYSICIAN:  James L. Malon Kindle., M.D.          DATE OF BIRTH:  [birth year]   DATE OF CONSULTATION:  12/31/2002  DATE OF DISCHARGE:                                   CONSULTATION   GASTROENTEROLOGY CONSULTATION:   REASON FOR CONSULTATION:  GI bleeding.   HISTORY OF PRESENT ILLNESS:  This is a pleasant, 77 year old white female  who had white female who had bright red blood per rectum the day prior to  admission. She had several bowel movements which were pure blood.  She has  had chronic problems with constipation.  She has had previous endoscopies  and colonoscopies done 3 or 4 years ago which have not really shown anything  in particularly.  When she had this rectal bleeding she had some crampy pain  associated with it. It seems to be somewhat better now.  She does have a  history of chronic constipation and diverticulosis with some episodes of  bleeding that are presumed to be due to either diverticular disease or  hemorrhoids.  She has had no fever or chills.   MEDICATIONS ON ADMISSION:  Aspirin, atenolol, Ativan, Ativan, Celexa,  gemfibrozil, Lasix, Imdur, K-Dur, isosorbide, Lipitor, Lanoxin, calcium  acetate, Nexium, Synthroid, nitroglycerin, Percocet, TUMS, vitamin B12  injections.   ALLERGIES:  She is allergic to codeine and Effexor.   PAST MEDICAL HISTORY:  She does have a history of goiter, hypothyroidism,  and hyperlipidemia.  She has a mild hypertension and a coronary disease with  Dr. Julieanne Manson being her cardiologist.  She does have gastroesophageal  reflux disease.  Previous endoscopy has shown no gross esophagitis.  She has  some symptom improvement with PPIs, osteoporosis, pernicious anemia, and  osteoarthritis of the spine.  She has had a history of  DVT.   PAST SURGICAL HISTORY:  Previous surgeries include cardiac catheterization  with angioplasty and stent.  She has had cholecystectomy, appendectomy, and  splenectomy; as well as partial thyroidectomy and several back surgeries.  She has had a D&C.  She has had a hip fracture.   FAMILY HISTORY:  Family history is positive for heart disease and breast  cancer.  It is negative for GI cancer.   SOCIAL HISTORY:  She is widowed and lives alone.  She does not smoke or  drink.  She is a Control and instrumentation engineer.   PHYSICAL EXAMINATION:  VITAL SIGNS:  Temperature 98.7, blood pressure  117/47.  GENERAL:  A pleasant anicteric white female.  LUNGS:  Lungs clear.  HEART:  Heart regular rate and rhythm without murmurs or gallops.  ABDOMEN:  Soft with minimal tenderness.  No rebound or rigidity.  RECTAL:  Rectal was not repeated.  Stool was brown and heme-positive on the  initial exam.   LABORATORY DATA:  Pertinent labs: Hemoglobin 11.9.   ASSESSMENT:  A subacute GI bleed, probably lower gastrointestinal.  Suspect  that this is either an ischemic colitis, diverticulosis, etc.  I do think,  given the fact that it has been 4 years, since she has had a colonoscopy; I  think that this ought to be done.   PLAN:  We will plan a colonoscopy tomorrow, approximately 1:30.  Will go  ahead and prep the patient tonight.  I have discussed this with her and she  is agreeable.                                               James L. Malon Kindle., M.D.    Waldron Session  D:  12/31/2002  T:  12/31/2002  Job:  [id number]

## 2011-03-18 NOTE — Cardiovascular Report (Signed)
NAME:  [name], [name]                        ACCOUNT NO.:  [id number]   MEDICAL RECORD NO.:  [id number]                   PATIENT TYPE:  INP   LOCATION:  4728                                 FACILITY:  [facility]   PHYSICIAN:  Thereasa Solo. Little, M.D.              DATE OF BIRTH:  November 12, 1916   DATE OF PROCEDURE:  10/17/2002  DATE OF DISCHARGE:                              CARDIAC CATHETERIZATION   INDICATIONS FOR TEST:  The patient is a 77 year old female who has had  multiple percutaneous interventions in the past 13 years. She has a stent in  her proximal LAD, proximal optional diagonal and in the midportion of her  RCA.  She was admitted with anginal sounding pain, negative cardiac enzymes  and brought to the catheterization lab on a elective basis for cardiac  catheterization.  She is an inpatient.   DESCRIPTION OF PROCEDURE:  The patient was prepped and draped in the usual  sterile fashion exposing the right groin. Following local anesthetic with 1%  Xylocaine, the Seldinger technique was employed and a 5 [place] introducer  sheath was placed into the right femoral artery. Left and right coronary  arteriography and ventriculography in the RAO projection was performed.   EQUIPMENT:  A 5 French Judkins configuration catheters.   COMPLICATIONS:  None.   RESULTS:  1. Hemodynamic monitoring:  Central aortic pressure was 141/43.  Left     ventricular pressure 142/180 with a left ventricular end-diastolic     pressure being measured at 145. There was no aortic valve gradient noted     at time of pullback.  2. Ventriculography:  Ventriculography in the RAO projection using 25 cc of     contrast revealed a hyperdynamic left ventricle.  Ejection fraction was     greater than 70%.  The end-diastolic pressure was 15.  Mitral valve     prolapse without mitral regurgitation was seen.   CORONARY ARTERIOGRAPHY:  On fluoroscopy stents were noted in the LAD,  optimal diagonal and RCA.  1. Left  main:  Normal.  2. LAD:  There is a stent in the proximal portion of the LAD with only     minimal irregularities noted. There is brisk TIMI-3 flow. The mid and     distal LAD are free of disease.  The first diagonal is free of disease.  3. Optimal diagonal:  The optimal diagonal is a large vessel with a stent in     the proximal segment.  It is patent. The distal vessel is free of     significant disease.  4. Circumflex:  The circumflex gives rise to a large OM and is normal.  5. RCA:  The stent in the midportion of the RCA is widely patent as is the     posterolateral and PDA branches. This stent is the area of the last     intervention which was a Cutting Balloon to  in-stent re-stenosis in     October of 2002.   CONCLUSIONS:  1. Widely patent stents with only minimal irregularities present at this     time.  2. Hyperdynamic left ventricle.  3. Mitral valve prolapse without mitral regurgitation.    DISCUSSION:  I cannot explain her chest pain from a cardiac standpoint.  Her  coronary anatomy is stable.  She does not need any type of recurrent  intervention.  We will discontinue heparin and she should be ready for  discharge home in the morning.                                                  Thereasa Solo. Little, M.D.    ABL/MEDQ  D:  10/17/2002  T:  10/17/2002  Job:  [id number]   cc:   Cardiac Catherterization Lab   Deniece Portela A. Sheffield Slider, M.D.  [address] [place]  [place] [address]  Fax: [phone]

## 2011-03-18 NOTE — Discharge Summary (Signed)
[name], [name]              ACCOUNT NO.:  [id number]   MEDICAL RECORD NO.:  [id number]          PATIENT TYPE:  INP   LOCATION:  5503                         FACILITY:  [facility]   PHYSICIAN:  Asencion Partridge, M.D.     DATE OF BIRTH:  June 16, 1917   DATE OF ADMISSION:  09/01/2005  DATE OF DISCHARGE:  09/03/2005                                 DISCHARGE SUMMARY   PRIMARY CARE PHYSICIAN:  Wayne A. Sheffield Slider, M.D.   CONSULTATIONS:  Dr. Ulla Gallo at [facility].   DISCHARGE DIAGNOSES:  1.  Chest pain, noncardiac in origin, likely related to GI pathology.  2.  Hypothyroidism.  3.  Hyperlipidemia.  4.  History of coronary artery disease, status post multiple coronary stents      to the LAD and RCA.  5.  Anxiety.  6.  Restless leg syndrome.  7.  Hypertension.  8.  History of atrial fibrillation.  9.  Chronic obstructive pulmonary disease.  10. History of anemia.   PROCEDURES:  1.  Chest x-ray, September 01, 2005, shows chronic lung changes but no acute      pulmonary findings.  2.  Catheterization performed September 02, 2005.  Please see the dictated      report for complete findings.  But in short the patient showed a widely      patent stent to the RCA, patent stent to the LAD, and proximal left      main, and a patent stent to the ramus.  There were no significant in-      stent stenoses and ejection fraction of 60%.   LABORATORY DATA:  On the day of discharge white count was 7.4, hemoglobin  was 12.5, hematocrit was 35.8, platelet count was 210.  BMP significant for  creatinine of 0.9 and potassium of 4.3.   Cardiac enzymes were negative x3.   TSH was 0.636 on admission.   HISTORY OF PRESENT ILLNESS:  Please see the dictated H&P on the chart for  complete details.  But in short, the patient is a very pleasant 77 year old  Caucasian female with a history of known coronary artery disease, status  post stents to the RCA, LAD, and left ramus, who  presented with increasing  frequency of chest pain associated with activity, relieved by nitroglycerin.  The patient was admitted to the hospital and ruled out for myocardial  infarction.   HOSPITAL COURSE:  1.  CHEST PAIN.  The patient was initially admitted and ruled out for      myocardial infarction with serial cardiac enzymes and EKGs.  These      proved to be negative.  At that point her cardiologist, Dr. Jacinto Halim, was      consulted and given the concerning history of the events as well as her      known coronary artery disease, the patient underwent catheterization and      ruled out in-stent stenosis.  The patient's catheterization revealed      patent coronary artery stents with no significant in-stent stenosis.  At      that  point the patient's pain was felt to be likely secondary to      gastritis or GERD.  She was discharged home on her same medicines      including omeprazole 20 mg p.o. b.i.d.  The patient states that this has      helped some with the pain in the past and she agrees that this might      likely be her reflux as well.   1.  HYPERTENSION.  The patient was maintained on her home medications      including Toprol 25 mg a day.   1.  HYPOTHYROIDISM.  The patient was maintained on her home dose of      Synthroid 75 mcg a day.   1.  ANXIETY.  The patient was maintained on her home dose of Celexa 10 mg a      day.   1.  CRAMPS.  The patient was reporting, however, significant right lower      extremity cramps at night.  She says that this is chronic and long-      standing.  Upon my review of her chart, it looks like she has a      diagnosis of restless leg syndrome.  However, she is not currently on      Elavil, Neurontin, or Requip.  The patient describes the cramps as pains      when she lies down at night, feels like she has to move her feet or they      are going to jump.  The pain extends into her distal right calf.  This      may be symptomatic for  restless leg syndrome.  However, I will not start      the patient on medications here at the present time since she is about      to be discharged from the hospital.  Will allow her to discuss with her      primary physician, Dr. Sheffield Slider, to see if she would benefit from starting      medications such as Elavil or Requip at her follow-up appointment.   DISCHARGE MEDICATIONS:  1.  Aspirin 325 daily.  2.  Plavix 75 mg daily.  3.  Celexa 10 daily.  4.  Plendil 5 mg daily.  5.  Lasix 20 mg p.r.n. daily.  6.  Synthroid 75 mcg daily.  7.  Toprol XL 25 mg daily.  8.  Omeprazole 20 mg b.i.d.  9.  K-Dur 10 mEq daily.  10. Senokot two tabs a day.  11. Zocor 40 mg daily.   FOLLOW UP:  The patient is instructed to follow up with Dr. Sheffield Slider in 1-2  weeks and call for an appointment.  Would appreciate at that time if Dr.  Sheffield Slider would discuss with the patient the cramps that she is having at night  and  determine whether or not he feels that she would benefit from medicine for  restless leg syndrome or if he feels that these are cramps if she would  benefit from medicine such as quinine for the cramps.  While in the hospital  the patient's potassium was normal and did not appear to be related to low  potassium secondary to her Lasix.      Broadus John T. Pamalee Leyden, MD    ______________________________  Asencion Partridge, M.D.    WTP/MEDQ  D:  09/03/2005  T:  09/04/2005  Job:  [id number]   cc:   Cristy Hilts.  Jacinto Halim, MD  Fax: [phone]

## 2011-03-18 NOTE — Cardiovascular Report (Signed)
[place]. [facility]  Patient:    [name], [name] Visit Number: [id number] MRN: [id number]          Service Type: MED Location: [phone] Attending Physician:  Loreli Dollar Dictated by:   Julieanne Manson, M.D. Proc. Date: 08/24/01 Admit Date:  08/23/2001 Discharge Date: 08/25/2001   CC:         Wayne A. Sheffield Slider, M.D.  Cardiac Catheterization Lab   Cardiac Catheterization  INDICATIONS FOR TEST:  Ms. Hunkele is an 77 year old female who had coronary disease and has had three stents placed over the last five years.  The last stent to her RCA was placed in March 2002.  She presented to my office with about 24 hours of intermittent chest pain, not relieved with nitroglycerin. Her EKG showed ST segment abnormalities in the inferolateral leads, due from prior tracings.  Her cardiac enzymes were unremarkable.  She was admitted to the hospital and brought in for cardiac catheterization this morning.  DESCRIPTION OF PROCEDURE:  The patient was prepped and draped in the usual sterile fashion, exposing the right groin.  Following local anesthetic with 1% Xylocaine, the Seldinger technique was employed.  A 6-French introducer sheath was placed into the right femoral artery.  Selective right and left coronary arteriography, ventriculography and then percutaneous intervention to the RCA was performed.  EQUIPMENT:  6-French diagnostic catheters were used in Judkins configurations.  COMPLICATIONS:  None.  RESULTS  HEMODYNAMIC MONITORING 1. Central aortic pressure:  159/65. 2. Left ventricular pressure:  153/16. 3. No significant aortic valve gradient noted at time of pullback.  VENTRICULOGRAPHY:  Done in the RAO projection using 25 cc of contrast at 12 cc/sec; revealed normal left ventricular systolic function.  Ejection fraction was calculated at 70%.  The end-diastolic pressure was 29.  CORONARY ARTERIOGRAPHY:  Three stents were noted on  fluoroscopy. 1. LEFT MAIN:  Normal. 2. LEFT ANTERIOR DESCENDING ARTERY:  Had a stent in its proximal portion.    The stent was widely patent.  The mid and distal LAD was free of    significant disease.  The first diagonal was free of disease. 2. OPTIONAL DIAGONAL:  There was a stent in the optional diagonal in the    proximal segment that was free of disease. 3. CIRCUMFLEX ARTERY:  Gave rise to one large OM system that was free of    disease. 4. RIGHT CORONARY ARTERY:  Had a stent in the mid portion.  There was 80%    in-stent restenosis.  This was a 3.0 x 15 Penta stent.  The distal    right coronary artery, the PDA and posterolateral branches were free    of disease.  PERCUTANEOUS INTERVENTION:  Because of the high-grade stenosis in the RCA stent, arrangements were made for intervention.  The 7-French sheath was placed where the previous 6-French sheath had been placed.  A 7-French JR4 guide catheter with side holes, a short Luge wire and a 3.25 x 10 mm cutting balloon was used.  The cutting balloon was placed in such a manner that all seven inflations were done within the stent, and the balloon was moved multiple times so that the entire area within the stent was effectively treated.  After seven inflations of about 10 atm for around 60 sec each, the area that had been previously 80% narrowed was now less than 10%, with brisk distal flow and no evidence of dissection or distal embolization. The patient was given Integrilin.  Her  ACT was managed and she was maintained with an ACT greater than 200.  She should be able to go home tomorrow.  She was given 150 mg of oral Plavix at the termination of the procedure.  She was premedicated with 25 mg of IV Benadryl, given 2 mg of Nubain and 2 mg Versed during the procedure. Dictated by:   Julieanne Manson, M.D. Attending Physician:  Loreli Dollar DD:  08/24/01 TD:  08/27/01 Job: [id number]

## 2011-03-18 NOTE — Cardiovascular Report (Signed)
NAMEDORETHIA, JEANMARIE              ACCOUNT NO.:  [id number]   MEDICAL RECORD NO.:  [id number]          PATIENT TYPE:  OIB   LOCATION:  2899                         FACILITY:  [facility]   PHYSICIAN:  Cristy Hilts. Jacinto Halim, MD       DATE OF BIRTH:  08/22/1917   DATE OF PROCEDURE:  03/16/2005  DATE OF DISCHARGE:                              CARDIAC CATHETERIZATION   PROCEDURE PERFORMED:  Percutaneous transluminal coronary angioplasty and  stenting of the mid left anterior descending.   INDICATIONS FOR PROCEDURE:  Ms. Leonhart is an 77 year old fairly active  Caucasian female with history of known coronary artery disease status post  angioplasty and stenting of her RCA in October 2002 and history of prior  stenting in the proximal and mid LAD in the past who has been having  increasing chest pain suggestive of angina pectoris and associated shortness  of breath.  Although the Cardiolite stress test that was performed recently  on July 25, 2005, was negative, she underwent cardiac catheterization  on Mar 09, 2005, at [facility] and this revealed a focal 80-85%  stenosis in the mid LAD.  Given this, she was brought to the cardiac  catheterization lab at [facility] for angioplasty to her mid LAD.   ANGIOGRAPHIC DATA:  Please see complete details of angiographic data of  native vessels dated Mar 09, 2005, at [facility] dictation.  Briefly, left main had mild calcification, otherwise, normal.   Circumflex has mild luminal irregularities and continues to a large obtuse  marginal branch.   LAD gives rise to high diagonal one and the proximal and mid segments of the  LAD has two tandem stents with a very small gap inbetween where a small  diagonal two arises.  This stent has 10-20% intimal hyperplasia  (restenosis).  The mid LAD, itself, has a tight 85% stenosis.   INTERVENTIONAL DATA:  Successful PTCA and stenting of the mid LAD with a 2.5  by 13 mm Cypher stent  deployed at 6 atmospheres pressure.  The stenosis was  reduced from 85% to 0% with TIMI III flow maintained at the end of the  procedure.  A total of 100 mL of contrast was utilized for the  interventional procedure.  The patient tolerated the procedure well.  No  immediate complications were noted.   RECOMMENDATIONS:  The patient will be continued on aspirin and Plavix at  least for a period of 3-6 months, probably for six months or higher.  She  will be continued on her prior medications which she has already been on  aggressive medical therapy.   TECHNIQUES OF PROCEDURE:  In the usual sterile technique, using right  femoral artery access, a 6 [place] FL4 guide was utilized to engage the left  main coronary artery and angiography was performed.  Then, 190 cm by 0.014  inch Asahi soft wire was utilized to cross into the LAD and angiography was  repeated.  200 mcg of intracoronary nitroglycerin was also administered  prior to inserting the wire.  Then, a 2.5 by 13 mm Cypher stent was  advanced  at the lesion site confirming the position of the stent, the stent was  deployed at 16 atmospheres of pressure for 60 seconds.  The balloon was  deflated and pulled back in the guiding catheter, 200 mcg intracoronary  nitroglycerin was  administered and angiography was performed.  Excellent results were noted  with no evidence of dissection or thrombus at the end of the procedure.  Then, the guide-wire was withdrawn, angiography repeated, guide catheter  disengaged and pulled out of the body in the usual fashion.       JRG/MEDQ  D:  03/16/2005  T:  03/16/2005  Job:  [id number]   cc:   Deniece Portela A. Sheffield Slider, M.D.  Fax: [phone]   Thereasa Solo. Little, M.D.  [address]  [address]  [place]  [place] [address]  Fax: [phone]

## 2011-03-25 ENCOUNTER — Ambulatory Visit (INDEPENDENT_AMBULATORY_CARE_PROVIDER_SITE_OTHER): Payer: Medicare Other | Admitting: *Deleted

## 2011-03-25 ENCOUNTER — Other Ambulatory Visit: Payer: Self-pay | Admitting: Family Medicine

## 2011-03-25 DIAGNOSIS — E538 Deficiency of other specified B group vitamins: Secondary | ICD-10-CM

## 2011-03-25 MED ORDER — CYANOCOBALAMIN 1000 MCG/ML IJ SOLN
1000.0000 ug | Freq: Once | INTRAMUSCULAR | Status: AC
  Administered 2011-03-25: 1000 ug via INTRAMUSCULAR

## 2011-03-25 NOTE — Progress Notes (Signed)
Patient in today for Vitamin B12 injection. Consulted Luretha Murphy and she gives order for Cyanocobalamin 1000 mcg every month.  Injection given today.

## 2011-03-29 ENCOUNTER — Other Ambulatory Visit: Payer: Self-pay | Admitting: Family Medicine

## 2011-03-29 ENCOUNTER — Telehealth: Payer: Self-pay | Admitting: Family Medicine

## 2011-03-29 DIAGNOSIS — M48061 Spinal stenosis, lumbar region without neurogenic claudication: Secondary | ICD-10-CM

## 2011-03-29 MED ORDER — OXYCODONE-ACETAMINOPHEN 5-325 MG PO TABS: 1.0000 | ORAL_TABLET | Freq: Three times a day (TID) | ORAL | Status: DC

## 2011-03-29 NOTE — Telephone Encounter (Signed)
Spoke with Mack Hook at [facility] and advised that Rx is ready to pick up. They will send a driver, Viviann Spare,  to pick up today. Rx placed in file in front office.

## 2011-04-20 ENCOUNTER — Other Ambulatory Visit: Payer: Self-pay | Admitting: Family Medicine

## 2011-04-20 DIAGNOSIS — M48061 Spinal stenosis, lumbar region without neurogenic claudication: Secondary | ICD-10-CM

## 2011-04-20 MED ORDER — OXYCODONE-ACETAMINOPHEN 5-325 MG PO TABS: 1.0000 | ORAL_TABLET | Freq: Three times a day (TID) | ORAL | Status: DC

## 2011-04-20 NOTE — Telephone Encounter (Signed)
Didn't print, so hand-written and left in "to be called" box

## 2011-04-26 ENCOUNTER — Ambulatory Visit (INDEPENDENT_AMBULATORY_CARE_PROVIDER_SITE_OTHER): Payer: Medicare Other | Admitting: *Deleted

## 2011-04-26 DIAGNOSIS — D510 Vitamin B12 deficiency anemia due to intrinsic factor deficiency: Secondary | ICD-10-CM

## 2011-04-26 MED ORDER — CYANOCOBALAMIN 1000 MCG/ML IJ SOLN
1000.0000 ug | Freq: Once | INTRAMUSCULAR | Status: AC
  Administered 2011-04-26: 1000 ug via INTRAMUSCULAR

## 2011-04-26 NOTE — Progress Notes (Signed)
Patient in for Vitamin B 12 injection.

## 2011-05-02 ENCOUNTER — Telehealth: Payer: Self-pay | Admitting: Family Medicine

## 2011-05-02 NOTE — Telephone Encounter (Signed)
Dr Clarene Duke sent a consult note from 04/27/11. Euvolemic and no medication changes made

## 2011-05-05 ENCOUNTER — Other Ambulatory Visit: Payer: Self-pay | Admitting: *Deleted

## 2011-05-05 ENCOUNTER — Other Ambulatory Visit: Payer: Self-pay | Admitting: Family Medicine

## 2011-05-05 DIAGNOSIS — M48061 Spinal stenosis, lumbar region without neurogenic claudication: Secondary | ICD-10-CM

## 2011-05-05 MED ORDER — OXYCODONE-ACETAMINOPHEN 5-325 MG PO TABS: 1.0000 | ORAL_TABLET | Freq: Three times a day (TID) | ORAL | Status: DC

## 2011-05-05 NOTE — Telephone Encounter (Signed)
Called several times to speak to someone in the assisted living med room. Was transferred, but no one answered. Faxed rx for oxycodone to [phone]. Marland KitchenArlyss Repress

## 2011-05-05 NOTE — Telephone Encounter (Signed)
Rx printed, signed and given to [facility]

## 2011-05-11 ENCOUNTER — Other Ambulatory Visit: Payer: Self-pay | Admitting: Family Medicine

## 2011-05-11 DIAGNOSIS — M48061 Spinal stenosis, lumbar region without neurogenic claudication: Secondary | ICD-10-CM

## 2011-05-11 MED ORDER — OXYCODONE-ACETAMINOPHEN 5-325 MG PO TABS: 1.0000 | ORAL_TABLET | Freq: Three times a day (TID) | ORAL | Status: DC

## 2011-05-18 ENCOUNTER — Encounter: Payer: Self-pay | Admitting: Family Medicine

## 2011-05-18 ENCOUNTER — Ambulatory Visit (INDEPENDENT_AMBULATORY_CARE_PROVIDER_SITE_OTHER): Payer: Medicare Other | Admitting: Family Medicine

## 2011-05-18 VITALS — BP 142/69 | HR 93 | Temp 98.3°F | Wt 126.7 lb

## 2011-05-18 DIAGNOSIS — J029 Acute pharyngitis, unspecified: Secondary | ICD-10-CM

## 2011-05-18 DIAGNOSIS — B9789 Other viral agents as the cause of diseases classified elsewhere: Secondary | ICD-10-CM

## 2011-05-18 DIAGNOSIS — B349 Viral infection, unspecified: Secondary | ICD-10-CM

## 2011-05-18 NOTE — Assessment & Plan Note (Addendum)
Throat strep swab negative. Symptoms most likely 2/2 virus. Pt appears well hydrated.  No red flags on exam.  Pt to do supportive measures.  Gave red flags for return.  See pt instructions.

## 2011-05-18 NOTE — Patient Instructions (Addendum)
Your test for strep is negative.  It looks like you have a virus. Take tylenol as needed for pain- 650 mg by mouth 2 x day. You can use lozenges for sore throat, salt water gargles.  Drink lots of fluids.  Return if any new or worsening of symptoms.

## 2011-05-18 NOTE — Progress Notes (Signed)
  Subjective:    Patient ID: [name], female    DOB: [birth year], 77 y.o.   MRN: [id number]  HPI sorethroat x 3 days. Has been using salt water gargle with minimal improvement.  + runny nose off and on.  Decreased appetite x 3 days.  + n/v x 1 this am after eating breakfast.  + fatigue. Able to drink po fluids but decreased ability to eat due to sore throat.    No diarrhea.  No further episodes of vomiting.  Nausea now resolved.  No blood in vomit.  No cough.  No urinary symptoms.    Review of Systems As per above    Objective:   Physical Exam  Constitutional: She is oriented to person, place, and time. She appears well-developed and well-nourished.  HENT:  Head: Normocephalic and atraumatic.  Mouth/Throat: No oropharyngeal exudate.       Mild throat erythema. Mucous membranes moist  Eyes: Right eye exhibits no discharge. Left eye exhibits no discharge.  Neck: Normal range of motion. Neck supple.  Cardiovascular: Normal rate and regular rhythm.  Exam reveals no gallop and no friction rub.   No murmur heard. Pulmonary/Chest: Effort normal. No respiratory distress. She has no wheezes.  Abdominal: Soft. She exhibits distension. There is no tenderness.  Musculoskeletal: Normal range of motion.       1+ lower ext edema bilateral  Neurological: She is alert and oriented to person, place, and time.  Skin: No rash noted.  Psychiatric: She has a normal mood and affect. Her behavior is normal.          Assessment & Plan:

## 2011-05-24 ENCOUNTER — Ambulatory Visit (INDEPENDENT_AMBULATORY_CARE_PROVIDER_SITE_OTHER): Payer: Medicare Other | Admitting: Family Medicine

## 2011-05-24 ENCOUNTER — Encounter: Payer: Self-pay | Admitting: Family Medicine

## 2011-05-24 VITALS — BP 107/54 | HR 87 | Temp 98.0°F | Ht 62.5 in | Wt 127.0 lb

## 2011-05-24 DIAGNOSIS — I509 Heart failure, unspecified: Secondary | ICD-10-CM

## 2011-05-24 DIAGNOSIS — I5032 Chronic diastolic (congestive) heart failure: Secondary | ICD-10-CM

## 2011-05-24 DIAGNOSIS — B9789 Other viral agents as the cause of diseases classified elsewhere: Secondary | ICD-10-CM

## 2011-05-24 DIAGNOSIS — D510 Vitamin B12 deficiency anemia due to intrinsic factor deficiency: Secondary | ICD-10-CM

## 2011-05-24 DIAGNOSIS — E785 Hyperlipidemia, unspecified: Secondary | ICD-10-CM

## 2011-05-24 DIAGNOSIS — I10 Essential (primary) hypertension: Secondary | ICD-10-CM

## 2011-05-24 DIAGNOSIS — B349 Viral infection, unspecified: Secondary | ICD-10-CM

## 2011-05-24 DIAGNOSIS — M48061 Spinal stenosis, lumbar region without neurogenic claudication: Secondary | ICD-10-CM

## 2011-05-24 MED ORDER — CYANOCOBALAMIN 1000 MCG/ML IJ SOLN
1000.0000 ug | Freq: Once | INTRAMUSCULAR | Status: AC
  Administered 2011-05-24: 1000 ug via INTRAMUSCULAR

## 2011-05-24 NOTE — Progress Notes (Signed)
  Subjective:    Patient ID: [name], female    DOB: Nov 21, 1916, 77 y.o.   MRN: [id number]  HPI she had a total of 3 episodes of vomiting and never developed diarrhea. No known ill contacts where she lives. She had one episode last week where she fell backwards onto the bed but was not injured. Her chronic right shoulder pain and limitation of motion remain the same.   She is due for a vitamin B12 shot today.  She's not taken any nitroglycerin because of no recent chest pain. Cardiologist did not change her medications on the last visit with him.   Her back pain is controlled on her current pain medication regimen.     Review of Systems     Objective:   Physical Exam  Cardiovascular: Normal rate and regular rhythm.   Pulmonary/Chest: Effort normal and breath sounds normal.  Musculoskeletal:       Trace edema Unable to lift right arm due to shoulder pain  Neurological: She is alert.  Psychiatric: She has a normal mood and affect. Her behavior is normal.          Assessment & Plan:

## 2011-05-24 NOTE — Patient Instructions (Signed)
It was great to see you today!  Return in one month for Vitamin B12 shot and to have fasting blood tests at that time.   Return to see Dr Sheffield Slider in 3 months.

## 2011-05-25 NOTE — Assessment & Plan Note (Signed)
well controlled  

## 2011-05-25 NOTE — Assessment & Plan Note (Signed)
Continue monthly IM B12

## 2011-05-25 NOTE — Assessment & Plan Note (Signed)
Currently well controlled.  

## 2011-05-25 NOTE — Assessment & Plan Note (Signed)
Resolved,  

## 2011-05-26 ENCOUNTER — Encounter: Payer: Self-pay | Admitting: Family Medicine

## 2011-05-30 NOTE — Progress Notes (Signed)
Addended by: Zachery Dauer on: 05/30/2011 05:26 PM   Modules accepted: Orders

## 2011-05-31 ENCOUNTER — Telehealth: Payer: Self-pay | Admitting: Family Medicine

## 2011-05-31 NOTE — Telephone Encounter (Signed)
I spoke with her daughter-in-law and reassured her that the plan is to prevent the need to come monthly to pick-up prescriptions.

## 2011-05-31 NOTE — Telephone Encounter (Signed)
Ms. Kozma sister called and is concerned about the letter regarding appt for narcotic refills.  She would like to talk to Dr. Sheffield Slider about the fact that she cannot bring her to the MD every time she needs a refill.

## 2011-06-02 ENCOUNTER — Other Ambulatory Visit: Payer: Self-pay | Admitting: Family Medicine

## 2011-06-02 DIAGNOSIS — H04123 Dry eye syndrome of bilateral lacrimal glands: Secondary | ICD-10-CM

## 2011-06-02 MED ORDER — TEARS RENEWED OP SOLN: 1.0000 [drp] | Freq: Four times a day (QID) | OPHTHALMIC | Status: DC

## 2011-06-07 ENCOUNTER — Telehealth: Payer: Self-pay | Admitting: Family Medicine

## 2011-06-07 ENCOUNTER — Telehealth: Payer: Self-pay | Admitting: *Deleted

## 2011-06-07 ENCOUNTER — Other Ambulatory Visit: Payer: Self-pay | Admitting: Family Medicine

## 2011-06-07 DIAGNOSIS — M48061 Spinal stenosis, lumbar region without neurogenic claudication: Secondary | ICD-10-CM

## 2011-06-07 MED ORDER — OXYCODONE-ACETAMINOPHEN 5-325 MG PO TABS: ORAL_TABLET | ORAL | Status: DC

## 2011-06-07 MED ORDER — OXYCODONE-ACETAMINOPHEN 5-325 MG PO TABS: 1.0000 | ORAL_TABLET | Freq: Four times a day (QID) | ORAL | Status: DC

## 2011-06-07 NOTE — Telephone Encounter (Signed)
Called pt and spoke [place]. Rx is ready and up front for pick up. Lorenda Hatchet, Renato Battles

## 2011-06-07 NOTE — Telephone Encounter (Signed)
[name] has had two Percocet orders one for tid and one for prn. She has been taking the prn order at least once daily. I will change the order in our chart and on the prescription to qid (may hold fourth dose if she is sleeping) since I got an inquiry from [employer] about the early refills she has been getting on the tid Rx.

## 2011-06-07 NOTE — Telephone Encounter (Signed)
Denise faxed rx's and original up front for pick up .Arlyss Repress

## 2011-06-07 NOTE — Telephone Encounter (Signed)
Forms dropped off to be filled ut for [facility].  Please call driver Thayer Ohm at [phone] to pick them up when completed.

## 2011-06-08 NOTE — Telephone Encounter (Signed)
MAR form reviewed and signed and returned to [employer]

## 2011-06-30 ENCOUNTER — Ambulatory Visit (INDEPENDENT_AMBULATORY_CARE_PROVIDER_SITE_OTHER): Payer: Medicare Other | Admitting: *Deleted

## 2011-06-30 ENCOUNTER — Other Ambulatory Visit: Payer: Self-pay | Admitting: Family Medicine

## 2011-06-30 ENCOUNTER — Other Ambulatory Visit: Payer: Medicare Other

## 2011-06-30 DIAGNOSIS — I10 Essential (primary) hypertension: Secondary | ICD-10-CM

## 2011-06-30 DIAGNOSIS — M48061 Spinal stenosis, lumbar region without neurogenic claudication: Secondary | ICD-10-CM

## 2011-06-30 DIAGNOSIS — D510 Vitamin B12 deficiency anemia due to intrinsic factor deficiency: Secondary | ICD-10-CM

## 2011-06-30 LAB — COMPREHENSIVE METABOLIC PANEL
ALT: <8 U/L (ref 0–35)
AST: 20 U/L (ref 0–37)
Alkaline Phosphatase: 60 U/L (ref 39–117)
CO2: 28 mEq/L (ref 19–32)
Calcium: 9.7 mg/dL (ref 8.4–10.5)
Creat: 0.94 mg/dL (ref 0.50–1.10)
Glucose, Bld: 90 mg/dL (ref 70–99)
Sodium: 139 mEq/L (ref 135–145)

## 2011-06-30 LAB — LIPID PANEL
Cholesterol: 125 mg/dL (ref 0–200)
Total CHOL/HDL Ratio: 2.5 Ratio
Triglycerides: 146 mg/dL (ref <150)
VLDL: 29 mg/dL (ref 0–40)

## 2011-06-30 MED ORDER — CYANOCOBALAMIN 1000 MCG/ML IJ SOLN: 1000.0000 ug | INTRAMUSCULAR | Status: DC

## 2011-06-30 MED ORDER — CYANOCOBALAMIN 1000 MCG/ML IJ SOLN
1000.0000 ug | Freq: Once | INTRAMUSCULAR | Status: AC
  Administered 2011-06-30: 1000 ug via INTRAMUSCULAR

## 2011-06-30 MED ORDER — OXYCODONE-ACETAMINOPHEN 5-325 MG PO TABS: ORAL_TABLET | ORAL | Status: DC

## 2011-06-30 NOTE — Progress Notes (Signed)
CMP AND FLP DONE TODAY MARCI HOLDER 

## 2011-07-28 ENCOUNTER — Ambulatory Visit (INDEPENDENT_AMBULATORY_CARE_PROVIDER_SITE_OTHER): Payer: Medicare Other | Admitting: *Deleted

## 2011-07-28 DIAGNOSIS — D510 Vitamin B12 deficiency anemia due to intrinsic factor deficiency: Secondary | ICD-10-CM

## 2011-07-28 MED ORDER — CYANOCOBALAMIN 1000 MCG/ML IJ SOLN
1000.0000 ug | Freq: Once | INTRAMUSCULAR | Status: AC
  Administered 2011-07-28: 1000 ug via INTRAMUSCULAR

## 2011-07-28 NOTE — Progress Notes (Signed)
Patient mentioned that sometimes she feels her heart skip. Daughter is with her and states she has  history of atrial fib .Cardiologist has checked her recently. Her rate is 80 now but rate is irregular.  Consulted with Luretha Murphy. Patient is feeling well today and daughter has no concern at this time.

## 2011-08-23 ENCOUNTER — Encounter: Payer: Self-pay | Admitting: Family Medicine

## 2011-08-23 ENCOUNTER — Ambulatory Visit (INDEPENDENT_AMBULATORY_CARE_PROVIDER_SITE_OTHER): Payer: Medicare Other | Admitting: Family Medicine

## 2011-08-23 VITALS — BP 97/60 | HR 94 | Ht 59.0 in | Wt 126.0 lb

## 2011-08-23 DIAGNOSIS — I509 Heart failure, unspecified: Secondary | ICD-10-CM

## 2011-08-23 DIAGNOSIS — I10 Essential (primary) hypertension: Secondary | ICD-10-CM

## 2011-08-23 DIAGNOSIS — N760 Acute vaginitis: Secondary | ICD-10-CM

## 2011-08-23 DIAGNOSIS — M48061 Spinal stenosis, lumbar region without neurogenic claudication: Secondary | ICD-10-CM

## 2011-08-23 DIAGNOSIS — Z23 Encounter for immunization: Secondary | ICD-10-CM

## 2011-08-23 DIAGNOSIS — E039 Hypothyroidism, unspecified: Secondary | ICD-10-CM

## 2011-08-23 DIAGNOSIS — D510 Vitamin B12 deficiency anemia due to intrinsic factor deficiency: Secondary | ICD-10-CM

## 2011-08-23 DIAGNOSIS — I5032 Chronic diastolic (congestive) heart failure: Secondary | ICD-10-CM

## 2011-08-23 MED ORDER — FUROSEMIDE 20 MG PO TABS: 20.0000 mg | ORAL_TABLET | ORAL | Status: DC

## 2011-08-23 MED ORDER — ESTROGENS, CONJUGATED 0.625 MG/GM VA CREA: TOPICAL_CREAM | Freq: Every day | VAGINAL | Status: DC

## 2011-08-23 MED ORDER — OXYCODONE-ACETAMINOPHEN 5-325 MG PO TABS: 1.0000 | ORAL_TABLET | Freq: Four times a day (QID) | ORAL | Status: DC

## 2011-08-23 MED ORDER — CYANOCOBALAMIN 1000 MCG/ML IJ SOLN
1000.0000 ug | Freq: Once | INTRAMUSCULAR | Status: AC
  Administered 2011-08-23: 1000 ug via INTRAMUSCULAR

## 2011-08-23 MED ORDER — OXYCODONE-ACETAMINOPHEN 5-325 MG PO TABS: ORAL_TABLET | ORAL | Status: DC

## 2011-08-23 NOTE — Progress Notes (Signed)
  Subjective:    Patient ID: [name], female    DOB: [birth year], 77 y.o.   MRN: [id number]  HPI is generally been doing well and enjoys playing bingo where she lives. Has asked to move to room but is closer to the Center. Continues to walk with her walker. Says that her pain relief is far from complete, but her daughter-in-law feels that she is functioning well. She has a good appetite is not gaining weight. No recent chest pain or shortness of breath.   She asked for Premarin Cream prescription and instructions to apply it herself  She denies being lightheaded with standing  He will see her cardiologist Dr. Clarene Duke at the end of November. Review of Systems     Objective:   Physical Exam she is alert and ambulates steadily with her walker.  No  significant pain evidenced on standing. Is very kyphotic.  Chest clear.  Heart regular rhythm with grade 2/6 systolic ejection murmur.  Ankles 1+ edema        Assessment & Plan:

## 2011-08-23 NOTE — Patient Instructions (Signed)
Decrease your furosemide to 20 mg daily because your blood pressure is too low.   Call if your ankles swell or you get short of breath lying.   Return to see Dr Sheffield Slider in one month.

## 2011-08-23 NOTE — Assessment & Plan Note (Signed)
Seems to be tolerating the pain control which is limited by side effects related to her age.

## 2011-08-23 NOTE — Assessment & Plan Note (Signed)
Continue topical premarin, may self apply

## 2011-08-23 NOTE — Assessment & Plan Note (Signed)
Watch for recurrence on decreased furosemide

## 2011-08-23 NOTE — Assessment & Plan Note (Signed)
blood pressure too low, will decrease furosemide

## 2011-09-15 ENCOUNTER — Telehealth: Payer: Self-pay | Admitting: Family Medicine

## 2011-09-15 NOTE — Telephone Encounter (Signed)
MAR from her ALF was reviewed and signed

## 2011-09-27 ENCOUNTER — Encounter: Payer: Self-pay | Admitting: Family Medicine

## 2011-09-27 ENCOUNTER — Ambulatory Visit (INDEPENDENT_AMBULATORY_CARE_PROVIDER_SITE_OTHER): Payer: Medicare Other | Admitting: Family Medicine

## 2011-09-27 VITALS — BP 130/74 | HR 84 | Ht 59.0 in | Wt 125.0 lb

## 2011-09-27 DIAGNOSIS — I10 Essential (primary) hypertension: Secondary | ICD-10-CM

## 2011-09-27 DIAGNOSIS — M48061 Spinal stenosis, lumbar region without neurogenic claudication: Secondary | ICD-10-CM

## 2011-09-27 DIAGNOSIS — N760 Acute vaginitis: Secondary | ICD-10-CM

## 2011-09-27 DIAGNOSIS — I2510 Atherosclerotic heart disease of native coronary artery without angina pectoris: Secondary | ICD-10-CM

## 2011-09-27 DIAGNOSIS — D510 Vitamin B12 deficiency anemia due to intrinsic factor deficiency: Secondary | ICD-10-CM

## 2011-09-27 DIAGNOSIS — H8110 Benign paroxysmal vertigo, unspecified ear: Secondary | ICD-10-CM

## 2011-09-27 MED ORDER — OXYCODONE-ACETAMINOPHEN 5-325 MG PO TABS: 1.0000 | ORAL_TABLET | Freq: Four times a day (QID) | ORAL | Status: DC

## 2011-09-27 MED ORDER — CYANOCOBALAMIN 1000 MCG/ML IJ SOLN
1000.0000 ug | Freq: Once | INTRAMUSCULAR | Status: AC
  Administered 2011-09-27: 1000 ug via INTRAMUSCULAR

## 2011-09-27 MED ORDER — OXYCODONE-ACETAMINOPHEN 5-325 MG PO TABS: ORAL_TABLET | ORAL | Status: DC

## 2011-09-27 NOTE — Assessment & Plan Note (Signed)
No recent Meclizine uses

## 2011-09-27 NOTE — Progress Notes (Signed)
  Subjective:    Patient ID: [name], female    DOB: 1917/01/30, 77 y.o.   MRN: [id number]  HPI she's had more back pain recently and has a steroid injection scheduled next week for her spinal stenosis.  She enjoys where she is living and likes to play bingo  Her daughter-in-law Harriett Sine thinks that she's having increased problems with her memory  She would like to have more pain medication, but is afraid of side effects  Review of Systems  Constitutional: Negative for activity change and appetite change.  HENT: Positive for hearing loss.   Respiratory: Negative for shortness of breath.   Cardiovascular: Negative for chest pain and leg swelling.  Gastrointestinal: Negative for constipation.  Genitourinary: Negative for vaginal pain.  Neurological: Positive for dizziness and tremors.       Objective:   Physical Exam  Constitutional: She is oriented to person, place, and time.  Cardiovascular: Normal rate and regular rhythm.   Pulmonary/Chest: Effort normal and breath sounds normal.  Musculoskeletal: She exhibits edema.       She is very kyphotic Trace edema of ankles  Neurological: She is alert and oriented to person, place, and time.       Registers 3 words, recalls 2 Oriented except to year   Psychiatric: She has a normal mood and affect. Her behavior is normal. Thought content normal.          Assessment & Plan:

## 2011-09-27 NOTE — Assessment & Plan Note (Signed)
Better since she has been applying the estrogen cream

## 2011-09-27 NOTE — Assessment & Plan Note (Signed)
well controlled  

## 2011-09-27 NOTE — Assessment & Plan Note (Signed)
Given B12 shot.  

## 2011-09-27 NOTE — Assessment & Plan Note (Signed)
Continue medications the same. Spinal steroid injection planned for next week.

## 2011-09-27 NOTE — Patient Instructions (Addendum)
It was good to see you today.  Please come back to see Dr Sheffield Slider in 3 months. Sooner if needed.   Return in December and January for vitamin B12 shots.   We will do blood tests on the next visit

## 2011-09-27 NOTE — Assessment & Plan Note (Signed)
No recent chest pain. Will follow up with Dr Clarene Duke next month.

## 2011-09-28 ENCOUNTER — Telehealth: Payer: Self-pay | Admitting: Family Medicine

## 2011-09-28 DIAGNOSIS — E039 Hypothyroidism, unspecified: Secondary | ICD-10-CM

## 2011-09-28 MED ORDER — LEVOTHYROXINE SODIUM 75 MCG PO TABS: 75.0000 ug | ORAL_TABLET | Freq: Every day | ORAL | Status: DC

## 2011-09-28 NOTE — Telephone Encounter (Signed)
[name] would like to speak to Dr. Sheffield Slider about how she needs to take her Thyroid medication.

## 2011-09-28 NOTE — Telephone Encounter (Signed)
Pt states she has always taken thyroid med in AM, Dr Sheffield Slider wrote for [facility]. Pt advised to take in AM per Dr Sheffield Slider.

## 2011-10-03 ENCOUNTER — Other Ambulatory Visit: Payer: Self-pay | Admitting: Physical Medicine and Rehabilitation

## 2011-10-03 ENCOUNTER — Other Ambulatory Visit: Payer: Self-pay | Admitting: Family Medicine

## 2011-10-03 DIAGNOSIS — Z8679 Personal history of other diseases of the circulatory system: Secondary | ICD-10-CM

## 2011-10-03 DIAGNOSIS — J449 Chronic obstructive pulmonary disease, unspecified: Secondary | ICD-10-CM

## 2011-10-03 DIAGNOSIS — M549 Dorsalgia, unspecified: Secondary | ICD-10-CM

## 2011-10-03 MED ORDER — ALBUTEROL SULFATE HFA 108 (90 BASE) MCG/ACT IN AERS: 2.0000 | INHALATION_SPRAY | RESPIRATORY_TRACT | Status: DC | PRN

## 2011-10-04 ENCOUNTER — Ambulatory Visit
Admission: RE | Admit: 2011-10-04 | Discharge: 2011-10-04 | Disposition: A | Discharge status: Home / Self Care | Payer: Medicare Other | Source: Ambulatory Visit | Attending: Physical Medicine and Rehabilitation | Admitting: Physical Medicine and Rehabilitation

## 2011-10-04 ENCOUNTER — Other Ambulatory Visit: Payer: Self-pay | Admitting: Physical Medicine and Rehabilitation

## 2011-10-04 DIAGNOSIS — M549 Dorsalgia, unspecified: Secondary | ICD-10-CM

## 2011-10-04 DIAGNOSIS — M48061 Spinal stenosis, lumbar region without neurogenic claudication: Secondary | ICD-10-CM

## 2011-10-04 MED ORDER — IOHEXOL 180 MG/ML  SOLN
1.0000 mL | Freq: Once | INTRAMUSCULAR | Status: AC | PRN
  Administered 2011-10-04: 1 mL via EPIDURAL

## 2011-10-04 MED ORDER — METHYLPREDNISOLONE ACETATE 40 MG/ML INJ SUSP (RADIOLOG
120.0000 mg | Freq: Once | INTRAMUSCULAR | Status: AC
  Administered 2011-10-04: 120 mg via EPIDURAL

## 2011-10-04 NOTE — Patient Instructions (Addendum)
Post Procedure Spinal Discharge Instruction Sheet  1. You may resume a regular diet and any medications that you routinely take (including pain medications).  2. No driving day of procedure.  3. Light activity throughout the rest of the day.  Do not do any strenuous work, exercise, bending or lifting.  The day following the procedure, you can resume normal physical activity but you should refrain from exercising or physical therapy for at least three days thereafter.   Common Side Effects:   Headaches- take your usual medications as directed by your physician.  Increase your fluid intake.  Caffeinated beverages may be helpful.  Lie flat in bed until your headache resolves.   Restlessness or inability to sleep- you may have trouble sleeping for the next few days.  Ask your referring physician if you need any medication for sleep.   Facial flushing or redness- should subside within a few days.   Increased pain- a temporary increase in pain a day or two following your procedure is not unusual.  Take your pain medication as prescribed by your referring physician.   Leg cramps  Please contact our office at [phone] for the following symptoms:  Fever greater than 100 degrees.  Headaches unresolved with medication after 2-3 days.  Increased swelling, pain, or redness at injection site.  Thank you for visiting our office.   Resume plavix today. 

## 2011-10-07 ENCOUNTER — Telehealth: Payer: Self-pay | Admitting: Family Medicine

## 2011-10-07 ENCOUNTER — Emergency Department (HOSPITAL_COMMUNITY): Payer: Medicare Other

## 2011-10-07 ENCOUNTER — Encounter (HOSPITAL_COMMUNITY): Payer: Self-pay

## 2011-10-07 ENCOUNTER — Emergency Department (HOSPITAL_COMMUNITY)
Admission: EM | Admit: 2011-10-07 | Discharge: 2011-10-07 | Disposition: A | Discharge status: Home / Self Care | Payer: Medicare Other | Attending: Emergency Medicine | Admitting: Emergency Medicine

## 2011-10-07 ENCOUNTER — Telehealth: Payer: Self-pay | Admitting: *Deleted

## 2011-10-07 DIAGNOSIS — Z79899 Other long term (current) drug therapy: Secondary | ICD-10-CM | POA: Insufficient documentation

## 2011-10-07 DIAGNOSIS — J4489 Other specified chronic obstructive pulmonary disease: Secondary | ICD-10-CM | POA: Insufficient documentation

## 2011-10-07 DIAGNOSIS — R05 Cough: Secondary | ICD-10-CM | POA: Insufficient documentation

## 2011-10-07 DIAGNOSIS — J449 Chronic obstructive pulmonary disease, unspecified: Secondary | ICD-10-CM | POA: Insufficient documentation

## 2011-10-07 DIAGNOSIS — J40 Bronchitis, not specified as acute or chronic: Secondary | ICD-10-CM | POA: Insufficient documentation

## 2011-10-07 DIAGNOSIS — I517 Cardiomegaly: Secondary | ICD-10-CM | POA: Insufficient documentation

## 2011-10-07 DIAGNOSIS — R059 Cough, unspecified: Secondary | ICD-10-CM | POA: Insufficient documentation

## 2011-10-07 HISTORY — DX: Gastro-esophageal reflux disease without esophagitis: K21.9

## 2011-10-07 HISTORY — DX: Unspecified atrial fibrillation: I48.91

## 2011-10-07 HISTORY — DX: Chronic obstructive pulmonary disease, unspecified: J44.9

## 2011-10-07 HISTORY — DX: Anxiety disorder, unspecified: F41.9

## 2011-10-07 MED ORDER — ALBUTEROL SULFATE HFA 108 (90 BASE) MCG/ACT IN AERS
2.0000 | INHALATION_SPRAY | Freq: Once | RESPIRATORY_TRACT | Status: AC
  Administered 2011-10-07: 2 via RESPIRATORY_TRACT
  Filled 2011-10-07: qty 6.7

## 2011-10-07 NOTE — Telephone Encounter (Signed)
[name] nurse, Anette Riedel, want a verbal order to get an Xray for Mrs. [name] due to her having a lot of chest congestion.  Please call him back asap

## 2011-10-07 NOTE — ED Notes (Addendum)
Discharge instruction given to Patient. Ptar called. Assisted living called and informed.

## 2011-10-07 NOTE — ED Notes (Signed)
Pt has had cough since yesterday, from [facility], MD wanted her to been seen in ED for CXR to r/o PNA

## 2011-10-07 NOTE — Telephone Encounter (Signed)
Called [facility] and requested to make appointment for patient to be seen due to her symptoms. Explained, that Dr.Hale, would not order a CXR, because patient needs to be seen.  Noah agreed. Lorenda Hatchet, Renato Battles

## 2011-10-07 NOTE — ED Provider Notes (Signed)
History     CSN: [id number] Arrival date & time: 10/07/2011  2:52 PM   First MD Initiated Contact with Patient 10/07/11 1533      Chief Complaint  Patient presents with  . Cough    (Consider location/radiation/quality/duration/timing/severity/associated sxs/prior treatment) HPI The patient reports cough for approximately 24 hours.  She reports no shortness of breath.  She denies unilateral leg swelling.  No prior history of DVT or pulmonary embolism.  She denies fever chills.  She reports her cough is bringing up light green sputum.  Nothing worsens her symptoms.  Nothing improves her symptoms.    Past Medical History  Diagnosis Date  . Congenital anisocoria   . Hallux valgus   . Lung nodule 1999    stable on followup CT  . DVT of lower extremity (deep venous thrombosis) 7/200o  . 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 Lumbar stenosis 2001  . S/P cardiac cath 08/2005    stents open  . 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 S/P endoscopy 12/07    mild gastritis  . Normal exercise sestamibi stress test 4/07  . Atrial fibrillation   . Anxiety   . COPD (chronic obstructive pulmonary disease)   . GERD (gastroesophageal reflux disease)     Past Surgical History  Procedure Date  . Thyroidectomy, partial 1933  . Coronary angioplasty with stent placement 9/99  . Appendectomy   . Cervical discectomy   . Cholecystectomy   . Coronary angioplasty with stent placement 2000  . Rotator cuff repair   . Orif hip fracture 2001  . Splenectomy     No family history on file.  History  Substance Use Topics  . Smoking status: Former Games developer  . Smokeless tobacco: Not on file  . Alcohol Use: Not on file    OB History    Grav Para Term Preterm Abortions TAB SAB Ect Mult Living                  Review of Systems  All other systems reviewed and are negative.    Allergies  Atorvastatin; Carbidopa w/levodopa; Codeine; Duloxetine; Gabapentin; Sulfamethoxazole  w/trimethoprim; Trimethoprim; and Venlafaxine  Home Medications   Current Outpatient Rx  Name Route Sig Dispense Refill  . ACETAMINOPHEN ER 650 MG PO TBCR Oral Take 650 mg by mouth at bedtime.      . ALBUTEROL SULFATE HFA 108 (90 BASE) MCG/ACT IN AERS Inhalation Inhale 2 puffs into the lungs every 4 (four) hours as needed. For shortness of breath     . ASPIRIN 81 MG PO CHEW Oral Chew 81 mg by mouth daily.      Marland Kitchen CALCIUM CARBONATE-VITAMIN D 500-200 MG-UNIT PO TABS Oral Take 1 tablet by mouth 2 (two) times daily.      Marland Kitchen VITAMIN D 1000 UNITS PO TABS Oral Take 1,000 Units by mouth daily.      Marland Kitchen CLOPIDOGREL BISULFATE 75 MG PO TABS Oral Take 75 mg by mouth daily.      Marland Kitchen ESTROGENS, CONJUGATED 0.625 MG/GM VA CREA Vaginal Place vaginally daily. 42.5 g 11    Patient may self apply  . ESOMEPRAZOLE MAGNESIUM 40 MG PO CPDR Oral Take 40 mg by mouth daily.      Marland Kitchen FERROUS SULFATE 325 (65 FE) MG PO TABS Oral Take 325 mg by mouth daily.      Marland Kitchen FOLIC ACID 1 MG PO TABS Oral Take 1 mg by mouth daily.      Marland Kitchen  FUROSEMIDE 20 MG PO TABS Oral Take 1 tablet (20 mg total) by mouth every morning. 30 tablet 11  . ISOSORBIDE MONONITRATE ER 30 MG PO TB24 Oral Take 30 mg by mouth daily.      Marland Kitchen LEVOTHYROXINE SODIUM 75 MCG PO TABS Oral Take 1 tablet (75 mcg total) by mouth daily before breakfast.    . MECLIZINE HCL 12.5 MG PO TABS Oral Take 12.5 mg by mouth every 4 (four) hours. As needed for vertigo.     Marland Kitchen METOPROLOL SUCCINATE ER 50 MG PO TB24 Oral Take 50 mg by mouth daily.      Marland Kitchen MIRTAZAPINE 15 MG PO TABS Oral Take 7.5 mg by mouth at bedtime.     Marland Kitchen NITROGLYCERIN 0.4 MG SL SUBL Sublingual Place 0.4 mg under the tongue. As needed for chest pain.     . OXYCODONE-ACETAMINOPHEN 5-325 MG PO TABS Oral Take 1 tablet by mouth 4 (four) times daily.      Marland Kitchen POLYETHYLENE GLYCOL 3350 PO POWD Oral Take 17 g by mouth daily as needed. For constipation    . POLYVINYL ALCOHOL 1.4 % OP SOLN Both Eyes Place 1 drop into both eyes 4 (four) times  daily. For dry eyes     . POTASSIUM CHLORIDE CRYS CR 20 MEQ PO TBCR Oral Take 20 mEq by mouth daily.      Marland Kitchen SIMVASTATIN 20 MG PO TABS Oral Take 20 mg by mouth at bedtime.      Marland Kitchen V-2 HIGH COMPRESSION HOSE MISC Does not apply by Does not apply route. Compression hose 20-48mmhg       BP 123/69  Pulse 72  Temp(Src) 97.6 F (36.4 C) (Oral)  Resp 16  SpO2 95%  Physical Exam  Nursing note and vitals reviewed. Constitutional: She is oriented to person, place, and time. She appears well-developed and well-nourished. No distress.  HENT:  Head: Normocephalic and atraumatic.  Eyes: EOM are normal.  Neck: Normal range of motion.  Cardiovascular: Normal rate, regular rhythm and normal heart sounds.   Pulmonary/Chest: Effort normal and breath sounds normal.  Abdominal: Soft. She exhibits no distension. There is no tenderness.  Musculoskeletal: Normal range of motion.  Neurological: She is alert and oriented to person, place, and time.  Skin: Skin is warm and dry.  Psychiatric: She has a normal mood and affect. Judgment normal.    ED Course  Procedures (including critical care time)  Labs Reviewed - No data to display Dg Chest 2 View  10/07/2011  *RADIOLOGY REPORT*  Clinical Data: Cough.  CHEST - 2 VIEW  Comparison: 11/17/2010  Findings: Mild cardiomegaly remains stable. Pulmonary hyperinflation again demonstrated.  No evidence of pulmonary infiltrate or edema.  No evidence of pleural effusion.  No mass or lymphadenopathy identified.  Pectus excavatum again noted.  IMPRESSION:  1.  Stable mild cardiomegaly and COPD.  No active lung disease. 2.  Pectus excavatum.  Original Report Authenticated By: Danae Orleans, M.D.   I personally reviewed the xray  1. Bronchitis    MDM  Chest x-rays without pneumonia.  The patient has no increased work of breathing.  She is well appearing.  I suspect this is bronchitis.  Will discharge back to [facility] place        Lyanne Co, MD 10/07/11  [phone]

## 2011-10-08 ENCOUNTER — Emergency Department (HOSPITAL_COMMUNITY): Payer: Medicare Other

## 2011-10-08 ENCOUNTER — Other Ambulatory Visit: Payer: Self-pay

## 2011-10-08 ENCOUNTER — Encounter (HOSPITAL_COMMUNITY): Payer: Self-pay | Admitting: *Deleted

## 2011-10-08 ENCOUNTER — Inpatient Hospital Stay (HOSPITAL_COMMUNITY)
Admission: EM | Admit: 2011-10-08 | Discharge: 2011-10-12 | DRG: 481 | Disposition: A | Discharge status: Skilled Nursing Facility | Payer: Medicare Other | Attending: Family Medicine | Admitting: Family Medicine

## 2011-10-08 DIAGNOSIS — I5032 Chronic diastolic (congestive) heart failure: Secondary | ICD-10-CM | POA: Diagnosis present

## 2011-10-08 DIAGNOSIS — S7223XA Displaced subtrochanteric fracture of unspecified femur, initial encounter for closed fracture: Principal | ICD-10-CM | POA: Diagnosis present

## 2011-10-08 DIAGNOSIS — E039 Hypothyroidism, unspecified: Secondary | ICD-10-CM | POA: Diagnosis present

## 2011-10-08 DIAGNOSIS — Z9861 Coronary angioplasty status: Secondary | ICD-10-CM

## 2011-10-08 DIAGNOSIS — F3289 Other specified depressive episodes: Secondary | ICD-10-CM | POA: Diagnosis present

## 2011-10-08 DIAGNOSIS — W19XXXA Unspecified fall, initial encounter: Secondary | ICD-10-CM | POA: Diagnosis present

## 2011-10-08 DIAGNOSIS — I129 Hypertensive chronic kidney disease with stage 1 through stage 4 chronic kidney disease, or unspecified chronic kidney disease: Secondary | ICD-10-CM | POA: Diagnosis present

## 2011-10-08 DIAGNOSIS — R000 Tachycardia, unspecified: Secondary | ICD-10-CM | POA: Diagnosis not present

## 2011-10-08 DIAGNOSIS — F329 Major depressive disorder, single episode, unspecified: Secondary | ICD-10-CM | POA: Diagnosis present

## 2011-10-08 DIAGNOSIS — K219 Gastro-esophageal reflux disease without esophagitis: Secondary | ICD-10-CM | POA: Diagnosis present

## 2011-10-08 DIAGNOSIS — J449 Chronic obstructive pulmonary disease, unspecified: Secondary | ICD-10-CM | POA: Diagnosis present

## 2011-10-08 DIAGNOSIS — G8929 Other chronic pain: Secondary | ICD-10-CM | POA: Diagnosis present

## 2011-10-08 DIAGNOSIS — I519 Heart disease, unspecified: Secondary | ICD-10-CM | POA: Diagnosis not present

## 2011-10-08 DIAGNOSIS — F411 Generalized anxiety disorder: Secondary | ICD-10-CM | POA: Diagnosis present

## 2011-10-08 DIAGNOSIS — Z886 Allergy status to analgesic agent status: Secondary | ICD-10-CM

## 2011-10-08 DIAGNOSIS — M48061 Spinal stenosis, lumbar region without neurogenic claudication: Secondary | ICD-10-CM | POA: Diagnosis present

## 2011-10-08 DIAGNOSIS — I509 Heart failure, unspecified: Secondary | ICD-10-CM | POA: Diagnosis present

## 2011-10-08 DIAGNOSIS — N183 Chronic kidney disease, stage 3 unspecified: Secondary | ICD-10-CM | POA: Diagnosis present

## 2011-10-08 DIAGNOSIS — Z888 Allergy status to other drugs, medicaments and biological substances status: Secondary | ICD-10-CM

## 2011-10-08 DIAGNOSIS — I4891 Unspecified atrial fibrillation: Secondary | ICD-10-CM | POA: Diagnosis present

## 2011-10-08 DIAGNOSIS — D510 Vitamin B12 deficiency anemia due to intrinsic factor deficiency: Secondary | ICD-10-CM | POA: Diagnosis present

## 2011-10-08 DIAGNOSIS — D62 Acute posthemorrhagic anemia: Secondary | ICD-10-CM | POA: Diagnosis not present

## 2011-10-08 DIAGNOSIS — I2510 Atherosclerotic heart disease of native coronary artery without angina pectoris: Secondary | ICD-10-CM | POA: Diagnosis present

## 2011-10-08 DIAGNOSIS — J4489 Other specified chronic obstructive pulmonary disease: Secondary | ICD-10-CM | POA: Diagnosis present

## 2011-10-08 HISTORY — DX: Chronic kidney disease, unspecified: N18.9

## 2011-10-08 HISTORY — DX: Anemia, unspecified: D64.9

## 2011-10-08 HISTORY — DX: Major depressive disorder, single episode, unspecified: F32.9

## 2011-10-08 HISTORY — DX: Myoneural disorder, unspecified: G70.9

## 2011-10-08 HISTORY — DX: Unspecified osteoarthritis, unspecified site: M19.90

## 2011-10-08 HISTORY — DX: Essential (primary) hypertension: I10

## 2011-10-08 HISTORY — DX: Depression, unspecified: F32.A

## 2011-10-08 LAB — CBC
MCH: 31.8 pg (ref 26.0–34.0)
MCHC: 32.7 g/dL (ref 30.0–36.0)
MCV: 97.3 fL (ref 78.0–100.0)
Platelets: 234 10*3/uL (ref 150–400)
RDW: 12.5 % (ref 11.5–15.5)

## 2011-10-08 LAB — COMPREHENSIVE METABOLIC PANEL
ALT: 14 U/L (ref 0–35)
AST: 32 U/L (ref 0–37)
Calcium: 10.5 mg/dL (ref 8.4–10.5)
Creatinine, Ser: 0.87 mg/dL (ref 0.50–1.10)
GFR calc Af Amer: 65 mL/min — ABNORMAL LOW (ref >90)
Glucose, Bld: 127 mg/dL — ABNORMAL HIGH (ref 70–99)
Sodium: 140 mEq/L (ref 135–145)
Total Protein: 8.2 g/dL (ref 6.0–8.3)

## 2011-10-08 LAB — DIFFERENTIAL
Basophils Absolute: 0 10*3/uL (ref 0.0–0.1)
Eosinophils Absolute: 0.2 10*3/uL (ref 0.0–0.7)
Eosinophils Relative: 2 % (ref 0–5)

## 2011-10-08 LAB — PROTIME-INR: Prothrombin Time: 14 seconds (ref 11.6–15.2)

## 2011-10-08 MED ORDER — FENTANYL CITRATE 0.05 MG/ML IJ SOLN
50.0000 ug | Freq: Once | INTRAMUSCULAR | Status: AC
  Administered 2011-10-08: 50 ug via INTRAVENOUS

## 2011-10-08 MED ORDER — FENTANYL CITRATE 0.05 MG/ML IJ SOLN
50.0000 ug | Freq: Once | INTRAMUSCULAR | Status: AC
  Administered 2011-10-08: 50 ug via INTRAVENOUS
  Filled 2011-10-08: qty 2

## 2011-10-08 MED ORDER — HYDROMORPHONE HCL PF 1 MG/ML IJ SOLN
0.5000 mg | Freq: Once | INTRAMUSCULAR | Status: AC
  Administered 2011-10-08: 0.5 mg via INTRAVENOUS
  Filled 2011-10-08: qty 1

## 2011-10-08 MED ORDER — HYDROMORPHONE HCL PF 1 MG/ML IJ SOLN: 0.5000 mg | INTRAMUSCULAR | Status: DC | PRN

## 2011-10-08 MED ORDER — SODIUM CHLORIDE 0.9 % IV SOLN: INTRAVENOUS | Status: AC

## 2011-10-08 MED ORDER — SODIUM CHLORIDE 0.9 % IV SOLN
0.5000 mg/h | INTRAVENOUS | Status: DC | PRN
  Filled 2011-10-08: qty 5

## 2011-10-08 MED ORDER — HEPARIN SODIUM (PORCINE) 5000 UNIT/ML IJ SOLN
5000.0000 [IU] | Freq: Three times a day (TID) | INTRAMUSCULAR | Status: DC
  Administered 2011-10-09 – 2011-10-10 (×4): 5000 [IU] via SUBCUTANEOUS
  Filled 2011-10-08 (×7): qty 1

## 2011-10-08 MED ORDER — HYDROMORPHONE HCL PF 1 MG/ML IJ SOLN
0.5000 mg | INTRAMUSCULAR | Status: DC | PRN
  Administered 2011-10-09: 0.5 mg via INTRAVENOUS
  Filled 2011-10-08: qty 1

## 2011-10-08 MED ORDER — ONDANSETRON HCL 4 MG/2ML IJ SOLN
4.0000 mg | Freq: Four times a day (QID) | INTRAMUSCULAR | Status: DC | PRN
  Administered 2011-10-09: 4 mg via INTRAVENOUS
  Filled 2011-10-08: qty 2

## 2011-10-08 MED ORDER — ONDANSETRON HCL 4 MG/2ML IJ SOLN: 4.0000 mg | Freq: Three times a day (TID) | INTRAMUSCULAR | Status: DC | PRN

## 2011-10-08 MED ORDER — ONDANSETRON HCL 4 MG/2ML IJ SOLN
4.0000 mg | Freq: Once | INTRAMUSCULAR | Status: AC
  Administered 2011-10-08: 4 mg via INTRAVENOUS
  Filled 2011-10-08: qty 2

## 2011-10-08 NOTE — ED Notes (Signed)
[facility] at bedside 

## 2011-10-08 NOTE — H&P (Signed)
[name] is an 77 y.o. female.    PCP: Dr. Zachery Dauer ([facility]) Cardiologist: Dr. Julieanne Manson Chief Complaint: left hip fracture HPI: This is a 77 YO Caucasian female with a history benign positional vertigo/Parkinson's disease/idiopathic neuropathy/history of gait abnormality, lumbar spinal stenosis, CAD s/p stents/chronic heart failure/paroxysmal atrial fibrillation, COPD, Stage 3 CKD presenting with left hip fracture following fall. History from patient's daughter and patient.  Patient lives in Assisted [facility] She was standing in the bathroom, someone opened the door, patient turned to see who it was, turned suddenly, and then fell on left hip. Denies loss of consciousness or dizziness. Patient reports she just lost her balance.  EMS called due to the patient having significant pain on that side. Patient found to have broken hip. Dr. Magnus Ivan (orthopedist) consulted in the ED. FMTS asked to admit patient for medical management.  Seen in the ED yesterday and diagnosed with bronchitis. Discharged with albuterol inhaler.    Past Medical History  Diagnosis Date  . Congenital anisocoria   . Hallux valgus   . Lung nodule 1999    stable on followup CT  . DVT of lower extremity (deep venous thrombosis) 7/200o  . 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 Lumbar stenosis 2001  . S/P cardiac cath 08/2005    stents open  . 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 S/P endoscopy 12/07    mild gastritis  . Normal exercise sestamibi stress test 4/07  . Atrial fibrillation   . Anxiety   . COPD (chronic obstructive pulmonary disease)   . GERD (gastroesophageal reflux disease)   . Angina   . Shortness of breath   . Chronic kidney disease   . Coronary artery disease   . Hypertension   . CHF (congestive heart failure)   . Hypothyroidism   . Arthritis   . Pneumonia   . Anemia   . Neuromuscular disorder   . Depression   No history of stroke or heart attack.    Past Surgical History  Procedure Date  . Thyroidectomy, partial 1933  . Coronary angioplasty with stent placement 9/99  . Appendectomy   . Cervical discectomy   . Cholecystectomy   . Coronary angioplasty with stent placement 2000  . Rotator cuff repair   . Orif hip fracture 2001  . Splenectomy     No family history on file. Social History:  reports that she quit smoking about 40 years ago. Her smokeless tobacco use includes Chew. Her alcohol and drug histories not on file. From [facility].   Allergies:  Allergies  Allergen Reactions  . Atorvastatin     REACTION: Palpitations  . Carbidopa W/Levodopa     REACTION: nausea, diarrhea, and vivid dreams  . Codeine     REACTION: Nausea  . Duloxetine     REACTION: Constipation  . Gabapentin     REACTION: Pruritic rash, nausea, lightheadedness  . Sulfamethoxazole W/Trimethoprim     REACTION: nausea  . Trimethoprim Other (See Comments)    Per mar  . Venlafaxine     REACTION: orthostasis    Medications Prior to Admission  Medication Dose Route Frequency Provider Last Rate Last Dose  . 0.9 %  sodium chloride infusion   Intravenous STAT Grant Fontana, [place]      . albuterol (PROVENTIL HFA;VENTOLIN HFA) 108 (90 BASE) MCG/ACT inhaler 2 puff  2 puff Inhalation Once Lyanne Co, MD   2  puff at 10/07/11 1635  . fentaNYL (SUBLIMAZE) injection 50 mcg  50 mcg Intravenous Once Grant Fontana, [place]   50 mcg at 10/08/11 1828  . fentaNYL (SUBLIMAZE) injection 50 mcg  50 mcg Intravenous Once Grant Fontana, [place]   50 mcg at 10/08/11 1903  . HYDROmorphone (DILAUDID) injection 0.5 mg  0.5 mg Intravenous Once Grant Fontana, PA   0.5 mg at 10/08/11 2023  . HYDROmorphone (DILAUDID) injection 0.5 mg  0.5 mg Intravenous Q4H PRN Grant Fontana, PA      . ondansetron [facility]) injection 4 mg  4 mg Intravenous Once Grant Fontana, [place]   4 mg at 10/08/11 2023  . ondansetron (ZOFRAN) injection 4 mg  4 mg  Intravenous Q8H PRN Grant Fontana, PA       Medications Prior to Admission  Medication Sig Dispense Refill  . acetaminophen (TYLENOL) 650 MG CR tablet Take 650 mg by mouth at bedtime.        Marland Kitchen albuterol (PROVENTIL HFA;VENTOLIN HFA) 108 (90 BASE) MCG/ACT inhaler Inhale 2 puffs into the lungs every 4 (four) hours as needed. For shortness of breath      . aspirin (BABY ASPIRIN) 81 MG chewable tablet Chew 81 mg by mouth daily.        . calcium-vitamin D (SM CALCIUM-VITAMIN D) 500-200 MG-UNIT per tablet Take 1 tablet by mouth 2 (two) times daily.        . cholecalciferol (VITAMIN D) 1000 UNIT tablet Take 1,000 Units by mouth daily.        . clopidogrel (PLAVIX) 75 MG tablet Take 75 mg by mouth daily.        . CYANOCOBALAMIN IJ Inject 1,000 mg as directed every 30 (thirty) days.        Marland Kitchen esomeprazole (NEXIUM) 40 MG capsule Take 40 mg by mouth daily.        . ferrous sulfate 325 (65 FE) MG tablet Take 325 mg by mouth daily.        . folic acid (FOLVITE) 1 MG tablet Take 1 mg by mouth daily.        . furosemide (LASIX) 20 MG tablet Take 1 tablet (20 mg total) by mouth every morning.  30 tablet  11  . isosorbide mononitrate (IMDUR) 30 MG 24 hr tablet Take 30 mg by mouth daily.        Marland Kitchen levothyroxine (LEVOTHROID) 75 MCG tablet Take 1 tablet (75 mcg total) by mouth daily before breakfast.      . metoprolol (TOPROL XL) 50 MG 24 hr tablet Take 50 mg by mouth daily.        . mirtazapine (REMERON) 15 MG tablet Take 7.5 mg by mouth at bedtime.       . nitroGLYCERIN (NITROSTAT) 0.4 MG SL tablet Place 0.4 mg under the tongue every 5 (five) minutes x 3 doses as needed. As needed for chest pain.      Marland Kitchen oxyCODONE-acetaminophen (PERCOCET) 5-325 MG per tablet Take 1 tablet by mouth 4 (four) times daily.        . polyethylene glycol (MIRALAX) powder Take 17 g by mouth daily as needed. For constipation      . polyvinyl alcohol (LIQUIFILM TEARS) 1.4 % ophthalmic solution Place 1 drop into both eyes 4 (four) times  daily. For dry eyes       . potassium chloride SA (K-DUR,KLOR-CON) 20 MEQ tablet Take 20 mEq by mouth daily.        . simvastatin (ZOCOR) 20 MG tablet  Take 20 mg by mouth at bedtime.          Results for orders placed during the hospital encounter of 10/08/11 (from the past 48 hour(s))  PROTIME-INR     Status: Normal   Collection Time   10/08/11  6:00 PM      Component Value Range Comment   Prothrombin Time 14.0  11.6 - 15.2 (seconds)    INR 1.06  0.00 - 1.49    APTT     Status: Normal   Collection Time   10/08/11  6:00 PM      Component Value Range Comment   aPTT 29  24 - 37 (seconds)   CBC     Status: Abnormal   Collection Time   10/08/11  6:00 PM      Component Value Range Comment   WBC 12.7 (*) 4.0 - 10.5 (K/uL)    RBC 4.06  3.87 - 5.11 (MIL/uL)    Hemoglobin 12.9  12.0 - 15.0 (g/dL)    HCT 04.5  40.9 - 81.1 (%)    MCV 97.3  78.0 - 100.0 (fL)    MCH 31.8  26.0 - 34.0 (pg)    MCHC 32.7  30.0 - 36.0 (g/dL)    RDW 91.4  78.2 - 95.6 (%)    Platelets 234  150 - 400 (K/uL)   DIFFERENTIAL     Status: Abnormal   Collection Time   10/08/11  6:00 PM      Component Value Range Comment   Neutrophils Relative 75  43 - 77 (%)    Neutro Abs 9.5 (*) 1.7 - 7.7 (K/uL)    Lymphocytes Relative 15  12 - 46 (%)    Lymphs Abs 1.9  0.7 - 4.0 (K/uL)    Monocytes Relative 8  3 - 12 (%)    Monocytes Absolute 1.1 (*) 0.1 - 1.0 (K/uL)    Eosinophils Relative 2  0 - 5 (%)    Eosinophils Absolute 0.2  0.0 - 0.7 (K/uL)    Basophils Relative 0  0 - 1 (%)    Basophils Absolute 0.0  0.0 - 0.1 (K/uL)   COMPREHENSIVE METABOLIC PANEL     Status: Abnormal   Collection Time   10/08/11  6:00 PM      Component Value Range Comment   Sodium 140  135 - 145 (mEq/L)    Potassium 4.7  3.5 - 5.1 (mEq/L) MODERATE HEMOLYSIS   Chloride 99  96 - 112 (mEq/L)    CO2 29  19 - 32 (mEq/L)    Glucose, Bld 127 (*) 70 - 99 (mg/dL)    BUN 21  6 - 23 (mg/dL)    Creatinine, Ser 2.13  0.50 - 1.10 (mg/dL)    Calcium 08.6  8.4 -  10.5 (mg/dL)    Total Protein 8.2  6.0 - 8.3 (g/dL)    Albumin 4.6  3.5 - 5.2 (g/dL)    AST 32  0 - 37 (U/L) MODERATE HEMOLYSIS   ALT 14  0 - 35 (U/L)    Alkaline Phosphatase 79  39 - 117 (U/L)    Total Bilirubin 0.4  0.3 - 1.2 (mg/dL)    GFR calc non Af Amer 56 (*) >90 (mL/min)    GFR calc Af Amer 65 (*) >90 (mL/min)    Dg Chest 1 View  10/08/2011  *RADIOLOGY REPORT*  Clinical Data: Fall.  Hip pain.  CHEST - 1 VIEW  Comparison: Two-view chest 10/07/2011.  Findings:  The patient is rotated to the right.  Emphysematous changes are again seen.  No focal airspace disease is evident. Marked degenerative changes are again seen at the right shoulder.  IMPRESSION:  1.  No acute cardiopulmonary disease. 2.  Emphysema.  Original Report Authenticated By: Jamesetta Orleans. MATTERN, M.D.   Dg Chest 2 View  10/07/2011  *RADIOLOGY REPORT*  Clinical Data: Cough.  CHEST - 2 VIEW  Comparison: 11/17/2010  Findings: Mild cardiomegaly remains stable. Pulmonary hyperinflation again demonstrated.  No evidence of pulmonary infiltrate or edema.  No evidence of pleural effusion.  No mass or lymphadenopathy identified.  Pectus excavatum again noted.  IMPRESSION:  1.  Stable mild cardiomegaly and COPD.  No active lung disease. 2.  Pectus excavatum.  Original Report Authenticated By: Danae Orleans, M.D.   Dg Hip Complete Left  10/08/2011  *RADIOLOGY REPORT*  Clinical Data: Hip pain  LEFT HIP - COMPLETE 2+ VIEW  Comparison: None.  Findings: Comminuted subtrochanteric left proximal femur fracture with anterior displacement of the dominant fracture fragment and apex anterior angulation.  Status post ORIF of prior right proximal femur fracture.  IMPRESSION: Comminuted subtrochanteric left proximal femur fracture, as described above.  Original Report Authenticated By: Charline Bills, M.D.   ECG: ED MD read as being HR 93, atrial fibrillation, non-specific TW abnormalities, with no significant change compared to 12/2010.  ROS No  fevers, chills No dizziness, vertigo No chest pain No difficulty breathing No abdominal pain  No dysuria  Blood pressure 151/74, pulse 76, temperature 97.9 F (36.6 C), temperature source Oral, resp. rate 22, height 5\' 1"  (1.549 m), weight 122 lb 3.2 oz (55.43 kg), SpO2 97.00%.  Physical Exam  Gen: significant distress, wincing in pain Psych: alert and oriented to name and month and year. For place, she thought she was at [facility] where she was recently transferred. Appropriate to questions. HEENT:   Eyes: no anisocoria, EOMI   Nose: no rhinorrhea, nasal discharge   Throat: dry MM   Neck: no LAD, full ROM CV: RRR, distant HS, no murmurs appreciated Pulm: no increased WOB, fair aeration (unable to take deep breaths due to hip pain) but grossly CTAB without w/r/r Abd: NABS, soft, NT, ND Hip: deferred due to significant pain Ext: no TTP, no edema Neuro: grossly intact; moves upper extremities but keeps lower extremities still due to pain but appears neurologically intact  Assessment/Plan This is a 77 YO Caucasian female with a history benign positional vertigo/Parkinson's disease/idiopathic neuropathy/history of gait abnormality, lumbar spinal stenosis, CAD s/p stents/chronic heart failure/paroxysmal atrial fibrillation, COPD, Stage 3 CKD presenting with left hip fracture following fall.   Left comminuted subtrochanteric proximal femur fracture.  -Will f/u Dr. Eliberto Ivory recommendation. Anticipate he will take her to OR tomorrow (patient's daughter spoke with Dr. Magnus Ivan over phone). -Will control pain with IV dilaudid 0.5mg  IV q4hrs prn   Cardiac: CAD s/p stents/chronic heart failure (ECHO 08/2008: EF 63% with mild MR)/paroxysmal atrial fibrillation -Will hold ASA 81/Plavix for now due to surgery likely tomorrow -Will hold home Lasix 20mg  qd while NPO -Will continue home Imdur 30mg  qd, metoprolol XL 50 -IV hydralazine prn BP > 160/90 -Will monitor on telemetry   Respiratory: history  of COPD. Stable currently. -Continue home albuterol prn.   Hypothyroid -Continue home Synthroid  Chronic back pain 2/2 lumbar spinal stenosis -On Dilaudid. Holding home percocet  Renal: Stage 3 CKD. Cr stable. -Will re-check in AM. Will give maintenance fluids while NPO.  Psych: anxiety/depression -Continue home Remeron  FEN/GI -NPO except meds after midnight -IVF @ maintenance  PPx -Heparin SQ for DVT PPx -Protonix IV for GERD PPx  Dispo -Pending ortho recommendations and clinical improvements   OH PARK, ANGELA 10/08/2011, 10:56 PM

## 2011-10-08 NOTE — ED Notes (Signed)
Pt's orthopedic doctor is Dr. August Saucer, pt belongings Lona Millard) given to family, family at bedside.

## 2011-10-08 NOTE — ED Notes (Signed)
EAV:[id number]<XB> Expected date:10/08/11<BR> Expected time: 4:27 PM<BR> Means of arrival:Ambulance<BR> Comments:<BR> EMS 89 GC, 89 yof sob, fall w lac to elbow ETA 10

## 2011-10-08 NOTE — ED Notes (Signed)
Per EMS:  Pt was leaning against a door when someone opened that door and the pt fell against the door jam and hit her head.  Reports no LOC, mentation at baseline, no signs of injury to the head.  Obvious deformity present to L hip.  EMS gave a total of 200 mcg of fentanyl in route.

## 2011-10-08 NOTE — ED Provider Notes (Signed)
History     CSN: [id number] Arrival date & time: 10/08/2011  5:32 PM   First MD Initiated Contact with Patient 10/08/11 1744      Chief Complaint  Patient presents with  . Fall  . Hip Pain    (Consider location/radiation/quality/duration/timing/severity/associated sxs/prior treatment) Patient is a 77 y.o. female presenting with fall. The history is provided by the patient.  Fall The accident occurred 3 to 5 hours ago. The fall occurred while walking. She fell from a height of 1 to 2 ft. She landed on carpet. There was no blood loss. The point of impact was the left hip and head. The pain is present in the left hip. The pain is at a severity of 10/10. The pain is severe. She was not ambulatory at the scene. There was no entrapment after the fall. There was no drug use involved in the accident. There was no alcohol use involved in the accident. Pertinent negatives include no visual change, no fever, no numbness, no abdominal pain, no bowel incontinence, no nausea, no vomiting, no hematuria, no headaches, no hearing loss, no loss of consciousness and no tingling. The symptoms are aggravated by use of the injured limb. She has tried nothing for the symptoms.  Pt was leaning against a door at assisted living when someone opened the door and she fell, striking her L hip. She struck her head slightly against the door frame but did not suffer LOC. Denies visual disturbance, dizziness, nausea/vomiting.   Her pain is currently located in the L hip. It is severe and sharp. She is unable to move the lower extremity. She has noted a deformity to the area.  Past Medical History  Diagnosis Date  . Congenital anisocoria   . Hallux valgus   . Lung nodule 1999    stable on followup CT  . DVT of lower extremity (deep venous thrombosis) 7/200o  . 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 Lumbar stenosis 2001  . S/P cardiac cath 08/2005    stents open  . 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 S/P  endoscopy 12/07    mild gastritis  . Normal exercise sestamibi stress test 4/07  . Atrial fibrillation   . Anxiety   . COPD (chronic obstructive pulmonary disease)   . GERD (gastroesophageal reflux disease)     Past Surgical History  Procedure Date  . Thyroidectomy, partial 1933  . Coronary angioplasty with stent placement 9/99  . Appendectomy   . Cervical discectomy   . Cholecystectomy   . Coronary angioplasty with stent placement 2000  . Rotator cuff repair   . Orif hip fracture 2001  . Splenectomy     No family history on file.  History  Substance Use Topics  . Smoking status: Former Games developer  . Smokeless tobacco: Not on file  . Alcohol Use: Not on file    OB History    Grav Para Term Preterm Abortions TAB SAB Ect Mult Living                  Review of Systems  Constitutional: Negative for fever and activity change.  HENT: Negative for facial swelling, neck pain and tinnitus.   Eyes: Negative for photophobia and visual disturbance.  Respiratory: Negative for shortness of breath and wheezing.   Cardiovascular: Negative for chest pain and palpitations.  Gastrointestinal: Negative for nausea, vomiting, abdominal pain, abdominal distention and bowel incontinence.  Genitourinary: Negative for hematuria.  Musculoskeletal:  Positive for arthralgias and gait problem.  Skin: Negative for color change and rash.  Neurological: Negative for dizziness, tingling, loss of consciousness, syncope, numbness and headaches.  Hematological: Does not bruise/bleed easily.    Allergies  Atorvastatin; Carbidopa w/levodopa; Codeine; Duloxetine; Gabapentin; Sulfamethoxazole w/trimethoprim; Trimethoprim; and Venlafaxine  Home Medications   Current Outpatient Rx  Name Route Sig Dispense Refill  . ACETAMINOPHEN ER 650 MG PO TBCR Oral Take 650 mg by mouth at bedtime.      . ALBUTEROL SULFATE HFA 108 (90 BASE) MCG/ACT IN AERS Inhalation Inhale 2 puffs into the lungs every 4 (four) hours as  needed. For shortness of breath     . ASPIRIN 81 MG PO CHEW Oral Chew 81 mg by mouth daily.      Marland Kitchen CALCIUM CARBONATE-VITAMIN D 500-200 MG-UNIT PO TABS Oral Take 1 tablet by mouth 2 (two) times daily.      Marland Kitchen VITAMIN D 1000 UNITS PO TABS Oral Take 1,000 Units by mouth daily.      Marland Kitchen CLOPIDOGREL BISULFATE 75 MG PO TABS Oral Take 75 mg by mouth daily.      Marland Kitchen ESTROGENS, CONJUGATED 0.625 MG/GM VA CREA Vaginal Place vaginally daily. 42.5 g 11    Patient may self apply  . V-2 HIGH COMPRESSION HOSE MISC Does not apply by Does not apply route. Compression hose 20-27mmhg     . ESOMEPRAZOLE MAGNESIUM 40 MG PO CPDR Oral Take 40 mg by mouth daily.      Marland Kitchen FERROUS SULFATE 325 (65 FE) MG PO TABS Oral Take 325 mg by mouth daily.      Marland Kitchen FOLIC ACID 1 MG PO TABS Oral Take 1 mg by mouth daily.      . FUROSEMIDE 20 MG PO TABS Oral Take 1 tablet (20 mg total) by mouth every morning. 30 tablet 11  . ISOSORBIDE MONONITRATE ER 30 MG PO TB24 Oral Take 30 mg by mouth daily.      Marland Kitchen LEVOTHYROXINE SODIUM 75 MCG PO TABS Oral Take 1 tablet (75 mcg total) by mouth daily before breakfast.    . MECLIZINE HCL 12.5 MG PO TABS Oral Take 12.5 mg by mouth every 4 (four) hours. As needed for vertigo.     Marland Kitchen METOPROLOL SUCCINATE ER 50 MG PO TB24 Oral Take 50 mg by mouth daily.      Marland Kitchen MIRTAZAPINE 15 MG PO TABS Oral Take 7.5 mg by mouth at bedtime.     Marland Kitchen NITROGLYCERIN 0.4 MG SL SUBL Sublingual Place 0.4 mg under the tongue. As needed for chest pain.     . OXYCODONE-ACETAMINOPHEN 5-325 MG PO TABS Oral Take 1 tablet by mouth 4 (four) times daily.      Marland Kitchen POLYETHYLENE GLYCOL 3350 PO POWD Oral Take 17 g by mouth daily as needed. For constipation    . POLYVINYL ALCOHOL 1.4 % OP SOLN Both Eyes Place 1 drop into both eyes 4 (four) times daily. For dry eyes     . POTASSIUM CHLORIDE CRYS CR 20 MEQ PO TBCR Oral Take 20 mEq by mouth daily.      Marland Kitchen SIMVASTATIN 20 MG PO TABS Oral Take 20 mg by mouth at bedtime.        BP 170/83  Pulse 60  Temp(Src)  97.8 F (36.6 C) (Oral)  Resp 20  SpO2 94%  Physical Exam  Nursing note and vitals reviewed. Constitutional: She is oriented to person, place, and time. She appears well-developed and well-nourished. No distress.  Pt in considerable discomfort 2/2 pain. She is mentating appropriately, is A+O.  HENT:  Head: Normocephalic and atraumatic.  Right Ear: External ear normal.  Left Ear: External ear normal.  Eyes: Conjunctivae and EOM are normal. Pupils are equal, round, and reactive to light.  Neck: Normal range of motion. Neck supple.  Cardiovascular: Normal rate, regular rhythm, normal heart sounds and intact distal pulses.   Pulmonary/Chest: Effort normal and breath sounds normal. No respiratory distress. She has no wheezes. She exhibits no tenderness.  Abdominal: Soft. Bowel sounds are normal. She exhibits no distension. There is no tenderness.  Musculoskeletal: She exhibits tenderness.       Left hip: She exhibits decreased range of motion, tenderness, bony tenderness and deformity.       Spine: no palpable stepoff, crepitus, deformity. No paraspinal muscle tenderness or spasm.  L hip: Obvious external rotation and shortening of the left leg. Lower extremities neurovascularly intact with good DP and PT pulses. Sensory intact to light touch. Diffuse tenderness to palpation over the hip.  Neurological: She is alert and oriented to person, place, and time. No cranial nerve deficit.  Skin: Skin is warm and dry. She is not diaphoretic.  Psychiatric: She has a normal mood and affect.    ED Course  Procedures (including critical care time)   Date: 10/08/2011  Rate: 93  Rhythm: atrial fibrillation  QRS Axis: normal  Intervals: normal  ST/T Wave abnormalities: indeterminate and borderline T abnormalities anteriorly, probable old inferior infarct  Conduction Disutrbances:none  Narrative Interpretation:   Old EKG Reviewed: no significant change as compared with January 16 2011; old ECG  shows 1st deg AV block    Labs Reviewed  CBC - Abnormal; Notable for the following:    WBC 12.7 (*)    All other components within normal limits  DIFFERENTIAL - Abnormal; Notable for the following:    Neutro Abs 9.5 (*)    Monocytes Absolute 1.1 (*)    All other components within normal limits  COMPREHENSIVE METABOLIC PANEL - Abnormal; Notable for the following:    Glucose, Bld 127 (*)    GFR calc non Af Amer 56 (*)    GFR calc Af Amer 65 (*)    All other components within normal limits  PROTIME-INR  APTT   Dg Chest 1 View  10/08/2011  *RADIOLOGY REPORT*  Clinical Data: Fall.  Hip pain.  CHEST - 1 VIEW  Comparison: Two-view chest 10/07/2011.  Findings: The patient is rotated to the right.  Emphysematous changes are again seen.  No focal airspace disease is evident. Marked degenerative changes are again seen at the right shoulder.  IMPRESSION:  1.  No acute cardiopulmonary disease. 2.  Emphysema.  Original Report Authenticated By: Jamesetta Orleans. MATTERN, M.D.   Dg Chest 2 View  10/07/2011  *RADIOLOGY REPORT*  Clinical Data: Cough.  CHEST - 2 VIEW  Comparison: 11/17/2010  Findings: Mild cardiomegaly remains stable. Pulmonary hyperinflation again demonstrated.  No evidence of pulmonary infiltrate or edema.  No evidence of pleural effusion.  No mass or lymphadenopathy identified.  Pectus excavatum again noted.  IMPRESSION:  1.  Stable mild cardiomegaly and COPD.  No active lung disease. 2.  Pectus excavatum.  Original Report Authenticated By: Danae Orleans, M.D.   Dg Hip Complete Left  10/08/2011  *RADIOLOGY REPORT*  Clinical Data: Hip pain  LEFT HIP - COMPLETE 2+ VIEW  Comparison: None.  Findings: Comminuted subtrochanteric left proximal femur fracture with anterior displacement of the dominant  fracture fragment and apex anterior angulation.  Status post ORIF of prior right proximal femur fracture.  IMPRESSION: Comminuted subtrochanteric left proximal femur fracture, as described above.   Original Report Authenticated By: Charline Bills, M.D.     1. Closed subtrochanteric fracture of hip       MDM  Pt seen and evaluated. Obvious external rotation deformity. Will image, obtain basic labs, and control pain.  7:55 PM Pt with comminuted subtrochanteric L hip fx. She was previously seen by Dr. August Saucer, who performed ORIF on her R hip. Call placed to MD on call for [facility] to discuss management.  8:10 PM Discussed with Dr. Magnus Ivan. He will consult on pt, but is on call at [facility] this evening and is unable to see her immediately. Recommended admit to medicine to manage medically. Pt's PCP is Dr. Sheffield Slider with [facility]. Call placed to [facility] at [facility] to discuss.  8:48 PM Pt will be transferred to [facility] for FP admission. Dr. Magnus Ivan aware. Temporary orders in chart, and EMTALA form completed. Plan discussed with pt and family member at bedside. They verbalized understanding and agreed to plan. Pt's pain considerably improved with 0.5 of Dilaudid.  Grant Fontana, [place] 10/08/11 2205  Grant Fontana, [place] 10/08/11 [phone]

## 2011-10-09 ENCOUNTER — Encounter (HOSPITAL_COMMUNITY): Payer: Self-pay | Admitting: Anesthesiology

## 2011-10-09 ENCOUNTER — Other Ambulatory Visit: Payer: Self-pay

## 2011-10-09 ENCOUNTER — Inpatient Hospital Stay (HOSPITAL_COMMUNITY): Payer: Medicare Other

## 2011-10-09 ENCOUNTER — Encounter (HOSPITAL_COMMUNITY)
Admission: EM | Disposition: A | Discharge status: Skilled Nursing Facility | Payer: Self-pay | Source: Home / Self Care | Attending: Family Medicine

## 2011-10-09 ENCOUNTER — Inpatient Hospital Stay (HOSPITAL_COMMUNITY): Payer: Medicare Other | Admitting: Anesthesiology

## 2011-10-09 DIAGNOSIS — F0150 Vascular dementia without behavioral disturbance: Secondary | ICD-10-CM

## 2011-10-09 DIAGNOSIS — S72009A Fracture of unspecified part of neck of unspecified femur, initial encounter for closed fracture: Secondary | ICD-10-CM

## 2011-10-09 HISTORY — PX: FEMUR IM NAIL: SHX1597

## 2011-10-09 LAB — SURGICAL PCR SCREEN: MRSA, PCR: NEGATIVE

## 2011-10-09 SURGERY — INSERTION, INTRAMEDULLARY ROD, FEMUR
Anesthesia: General | Site: Leg Upper | Laterality: Left | Wound class: Clean

## 2011-10-09 MED ORDER — CEFAZOLIN SODIUM 1-5 GM-% IV SOLN
INTRAVENOUS | Status: AC
  Filled 2011-10-09: qty 50

## 2011-10-09 MED ORDER — ONDANSETRON HCL 4 MG/2ML IJ SOLN
INTRAMUSCULAR | Status: DC | PRN
  Administered 2011-10-09: 4 mg via INTRAVENOUS

## 2011-10-09 MED ORDER — POLYVINYL ALCOHOL 1.4 % OP SOLN
1.0000 [drp] | Freq: Four times a day (QID) | OPHTHALMIC | Status: DC
  Administered 2011-10-09 – 2011-10-12 (×14): 1 [drp] via OPHTHALMIC
  Filled 2011-10-09: qty 15

## 2011-10-09 MED ORDER — NEOSTIGMINE METHYLSULFATE 1 MG/ML IJ SOLN
INTRAMUSCULAR | Status: DC | PRN
  Administered 2011-10-09: 2 mg via INTRAVENOUS

## 2011-10-09 MED ORDER — PHENYLEPHRINE HCL 10 MG/ML IJ SOLN
INTRAMUSCULAR | Status: DC | PRN
  Administered 2011-10-09: 40 ug via INTRAVENOUS
  Administered 2011-10-09 (×2): 80 ug via INTRAVENOUS
  Administered 2011-10-09: 40 ug via INTRAVENOUS

## 2011-10-09 MED ORDER — LEVOTHYROXINE SODIUM 75 MCG PO TABS
75.0000 ug | ORAL_TABLET | Freq: Every day | ORAL | Status: DC
  Administered 2011-10-10 – 2011-10-12 (×3): 75 ug via ORAL
  Filled 2011-10-09 (×5): qty 1

## 2011-10-09 MED ORDER — PROMETHAZINE HCL 25 MG/ML IJ SOLN: 6.2500 mg | INTRAMUSCULAR | Status: DC | PRN

## 2011-10-09 MED ORDER — LACTATED RINGERS IV SOLN
INTRAVENOUS | Status: DC | PRN
  Administered 2011-10-09 (×2): via INTRAVENOUS

## 2011-10-09 MED ORDER — METOPROLOL SUCCINATE ER 50 MG PO TB24
50.0000 mg | ORAL_TABLET | Freq: Every day | ORAL | Status: DC
  Administered 2011-10-09: 50 mg via ORAL
  Filled 2011-10-09 (×2): qty 1

## 2011-10-09 MED ORDER — FENTANYL CITRATE 0.05 MG/ML IJ SOLN
INTRAMUSCULAR | Status: DC | PRN
  Administered 2011-10-09: 25 ug via INTRAVENOUS
  Administered 2011-10-09: 50 ug via INTRAVENOUS
  Administered 2011-10-09: 25 ug via INTRAVENOUS

## 2011-10-09 MED ORDER — PANTOPRAZOLE SODIUM 40 MG PO TBEC
40.0000 mg | DELAYED_RELEASE_TABLET | Freq: Every day | ORAL | Status: DC
  Administered 2011-10-10 – 2011-10-12 (×3): 40 mg via ORAL
  Filled 2011-10-09 (×3): qty 1

## 2011-10-09 MED ORDER — ESMOLOL HCL 10 MG/ML IV SOLN
INTRAVENOUS | Status: DC | PRN
  Administered 2011-10-09: 30 mg via INTRAVENOUS
  Administered 2011-10-09: 20 mg via INTRAVENOUS

## 2011-10-09 MED ORDER — HYDROMORPHONE HCL PF 1 MG/ML IJ SOLN
0.5000 mg | INTRAMUSCULAR | Status: DC | PRN
  Administered 2011-10-09 (×4): 0.5 mg via INTRAVENOUS
  Filled 2011-10-09 (×4): qty 1

## 2011-10-09 MED ORDER — PROPOFOL 10 MG/ML IV EMUL
INTRAVENOUS | Status: DC | PRN
  Administered 2011-10-09: 50 mg via INTRAVENOUS

## 2011-10-09 MED ORDER — OXYCODONE HCL 5 MG PO TABS
5.0000 mg | ORAL_TABLET | ORAL | Status: DC | PRN
  Administered 2011-10-09 – 2011-10-12 (×9): 5 mg via ORAL
  Filled 2011-10-09 (×10): qty 1

## 2011-10-09 MED ORDER — VITAMIN D3 25 MCG (1000 UNIT) PO TABS
1000.0000 [IU] | ORAL_TABLET | Freq: Every day | ORAL | Status: DC
  Administered 2011-10-10 – 2011-10-12 (×3): 1000 [IU] via ORAL
  Filled 2011-10-09 (×4): qty 1

## 2011-10-09 MED ORDER — HYDROMORPHONE HCL PF 1 MG/ML IJ SOLN: 0.2500 mg | INTRAMUSCULAR | Status: DC | PRN

## 2011-10-09 MED ORDER — ACETAMINOPHEN 10 MG/ML IV SOLN
INTRAVENOUS | Status: DC | PRN
  Administered 2011-10-09: 1000 mg via INTRAVENOUS

## 2011-10-09 MED ORDER — FOLIC ACID 1 MG PO TABS
1.0000 mg | ORAL_TABLET | Freq: Every day | ORAL | Status: DC
  Administered 2011-10-10 – 2011-10-12 (×3): 1 mg via ORAL
  Filled 2011-10-09 (×4): qty 1

## 2011-10-09 MED ORDER — PHENYLEPHRINE HCL 10 MG/ML IJ SOLN
10.0000 mg | INTRAVENOUS | Status: DC | PRN
  Administered 2011-10-09: 10 ug/min via INTRAVENOUS

## 2011-10-09 MED ORDER — METHOCARBAMOL 500 MG PO TABS
500.0000 mg | ORAL_TABLET | Freq: Four times a day (QID) | ORAL | Status: DC | PRN
  Filled 2011-10-09: qty 1

## 2011-10-09 MED ORDER — CEFAZOLIN SODIUM 1-5 GM-% IV SOLN
INTRAVENOUS | Status: DC | PRN
  Administered 2011-10-09: 1 g via INTRAVENOUS

## 2011-10-09 MED ORDER — MEPERIDINE HCL 25 MG/ML IJ SOLN: 6.2500 mg | INTRAMUSCULAR | Status: DC | PRN

## 2011-10-09 MED ORDER — MIRTAZAPINE 7.5 MG PO TABS
7.5000 mg | ORAL_TABLET | Freq: Every day | ORAL | Status: DC
  Administered 2011-10-09 – 2011-10-11 (×2): 7.5 mg via ORAL
  Filled 2011-10-09 (×4): qty 1

## 2011-10-09 MED ORDER — CEFAZOLIN SODIUM 1-5 GM-% IV SOLN
1.0000 g | Freq: Three times a day (TID) | INTRAVENOUS | Status: AC
  Administered 2011-10-09 – 2011-10-10 (×3): 1 g via INTRAVENOUS
  Filled 2011-10-09 (×3): qty 50

## 2011-10-09 MED ORDER — CALCIUM CARBONATE-VITAMIN D 500-200 MG-UNIT PO TABS
1.0000 | ORAL_TABLET | Freq: Two times a day (BID) | ORAL | Status: DC
  Administered 2011-10-09 – 2011-10-12 (×6): 1 via ORAL
  Filled 2011-10-09 (×8): qty 1

## 2011-10-09 MED ORDER — ISOSORBIDE MONONITRATE ER 30 MG PO TB24
30.0000 mg | ORAL_TABLET | Freq: Every day | ORAL | Status: DC
  Administered 2011-10-09: 30 mg via ORAL
  Filled 2011-10-09 (×2): qty 1

## 2011-10-09 MED ORDER — FERROUS SULFATE 325 (65 FE) MG PO TABS
325.0000 mg | ORAL_TABLET | Freq: Every day | ORAL | Status: DC
  Administered 2011-10-10 – 2011-10-12 (×3): 325 mg via ORAL
  Filled 2011-10-09 (×4): qty 1

## 2011-10-09 MED ORDER — MECLIZINE HCL 25 MG PO TABS
25.0000 mg | ORAL_TABLET | Freq: Four times a day (QID) | ORAL | Status: DC | PRN
  Filled 2011-10-09: qty 1

## 2011-10-09 MED ORDER — SODIUM CHLORIDE 0.9 % IR SOLN
Status: DC | PRN
  Administered 2011-10-09: 1

## 2011-10-09 MED ORDER — ALBUTEROL SULFATE HFA 108 (90 BASE) MCG/ACT IN AERS
2.0000 | INHALATION_SPRAY | RESPIRATORY_TRACT | Status: DC | PRN
  Administered 2011-10-11 – 2011-10-12 (×3): 2 via RESPIRATORY_TRACT
  Filled 2011-10-09: qty 6.7

## 2011-10-09 MED ORDER — METOPROLOL TARTRATE 1 MG/ML IV SOLN
INTRAVENOUS | Status: DC | PRN
  Administered 2011-10-09 (×2): 1 mg via INTRAVENOUS

## 2011-10-09 MED ORDER — GLYCOPYRROLATE 0.2 MG/ML IJ SOLN
INTRAMUSCULAR | Status: DC | PRN
  Administered 2011-10-09: .4 mg via INTRAVENOUS

## 2011-10-09 MED ORDER — POLYETHYLENE GLYCOL 3350 17 GM/SCOOP PO POWD
17.0000 g | Freq: Every day | ORAL | Status: DC | PRN
  Filled 2011-10-09: qty 255

## 2011-10-09 MED ORDER — ACETAMINOPHEN 10 MG/ML IV SOLN
INTRAVENOUS | Status: AC
  Filled 2011-10-09: qty 100

## 2011-10-09 MED ORDER — ROCURONIUM BROMIDE 100 MG/10ML IV SOLN
INTRAVENOUS | Status: DC | PRN
  Administered 2011-10-09: 30 mg via INTRAVENOUS

## 2011-10-09 SURGICAL SUPPLY — 53 items
3.2 IMHS CP GUIDE PIN ×1 IMPLANT
BANDAGE GAUZE ELAST BULKY 4 IN (GAUZE/BANDAGES/DRESSINGS) ×2 IMPLANT
BIT DRILL 3.5 QC 155 (BIT) ×1 IMPLANT
BLADE SURG 15 STRL LF DISP TIS (BLADE) ×1 IMPLANT
BLADE SURG 15 STRL SS (BLADE)
BNDG COHESIVE 4X5 TAN NS LF (GAUZE/BANDAGES/DRESSINGS) ×2 IMPLANT
CLOTH BEACON ORANGE TIMEOUT ST (SAFETY) ×2 IMPLANT
COVER MAYO STAND STRL (DRAPES) ×1 IMPLANT
COVER SURGICAL LIGHT HANDLE (MISCELLANEOUS) ×2 IMPLANT
DRAPE PROXIMA HALF (DRAPES) IMPLANT
DRAPE STERI IOBAN 125X83 (DRAPES) ×2 IMPLANT
DRSG MEPILEX BORDER 4X4 (GAUZE/BANDAGES/DRESSINGS) ×2 IMPLANT
DRSG MEPILEX BORDER 4X8 (GAUZE/BANDAGES/DRESSINGS) ×2 IMPLANT
DRSG PAD ABDOMINAL 8X10 ST (GAUZE/BANDAGES/DRESSINGS) ×4 IMPLANT
DURAPREP 26ML APPLICATOR (WOUND CARE) ×2 IMPLANT
ELECT REM PT RETURN 9FT ADLT (ELECTROSURGICAL) ×2
ELECTRODE REM PT RTRN 9FT ADLT (ELECTROSURGICAL) ×1 IMPLANT
FACESHIELD LNG OPTICON STERILE (SAFETY) ×1 IMPLANT
GAUZE XEROFORM 5X9 LF (GAUZE/BANDAGES/DRESSINGS) ×2 IMPLANT
GLOVE BIOGEL PI IND STRL 8 (GLOVE) ×1 IMPLANT
GLOVE BIOGEL PI INDICATOR 8 (GLOVE) ×1
GLOVE ORTHO TXT STRL SZ7.5 (GLOVE) ×2 IMPLANT
GOWN PREVENTION PLUS LG XLONG (DISPOSABLE) IMPLANT
GOWN PREVENTION PLUS XLARGE (GOWN DISPOSABLE) ×1 IMPLANT
GOWN STRL NON-REIN LRG LVL3 (GOWN DISPOSABLE) ×2 IMPLANT
GUIDE PIN 3.2 LONG (PIN) ×1 IMPLANT
GUIDE PIN 3.2MM (MISCELLANEOUS) ×2
GUIDE PIN ORTH 343X3.2XBRAD (MISCELLANEOUS) IMPLANT
GUIDE ROD 3.0 (MISCELLANEOUS) ×2
HIP SCREW SET (Screw) ×1 IMPLANT
KIT BASIN OR (CUSTOM PROCEDURE TRAY) ×2 IMPLANT
KIT ROOM TURNOVER OR (KITS) ×2 IMPLANT
MANIFOLD NEPTUNE II (INSTRUMENTS) ×2 IMPLANT
NAIL IMSH 10X36 (Nail) ×1 IMPLANT
NS IRRIG 1000ML POUR BTL (IV SOLUTION) ×2 IMPLANT
PACK GENERAL/GYN (CUSTOM PROCEDURE TRAY) ×2 IMPLANT
PAD ARMBOARD 7.5X6 YLW CONV (MISCELLANEOUS) ×4 IMPLANT
PAD CAST 4YDX4 CTTN HI CHSV (CAST SUPPLIES) ×2 IMPLANT
PADDING CAST COTTON 4X4 STRL (CAST SUPPLIES) ×4
ROD GUIDE 3.0 (MISCELLANEOUS) IMPLANT
SCREW CAPTURED 4.4X32MM (Screw) ×1 IMPLANT
SCREW COMPRESSION (Screw) ×1 IMPLANT
SCREW LAG 90MM (Screw) ×2 IMPLANT
SCREW LAGSTD 90X21X12.7X9 (Screw) IMPLANT
SCREW LOCKING TITAN 4.5X36 (Screw) ×2 IMPLANT
STAPLER VISISTAT 35W (STAPLE) ×2 IMPLANT
SUT VIC AB 0 CT1 27 (SUTURE) ×4
SUT VIC AB 0 CT1 27XBRD ANBCTR (SUTURE) ×2 IMPLANT
SUT VIC AB 2-0 CT1 27 (SUTURE) ×4
SUT VIC AB 2-0 CT1 TAPERPNT 27 (SUTURE) ×2 IMPLANT
TOWEL OR 17X24 6PK STRL BLUE (TOWEL DISPOSABLE) ×2 IMPLANT
TOWEL OR 17X26 10 PK STRL BLUE (TOWEL DISPOSABLE) ×2 IMPLANT
WATER STERILE IRR 1000ML POUR (IV SOLUTION) ×2 IMPLANT

## 2011-10-09 NOTE — Preoperative (Signed)
Beta Blockers   Reason not to administer Beta Blockers:Not Applicable 

## 2011-10-09 NOTE — Transfer of Care (Signed)
Immediate Anesthesia Transfer of Care Note  Patient: [name]  Procedure(s) Performed:  INTRAMEDULLARY (IM) NAIL FEMORAL  Patient Location: PACU  Anesthesia Type: General  Level of Consciousness: awake and patient cooperative  Airway & Oxygen Therapy: Patient Spontanous Breathing and Patient connected to nasal cannula oxygen  Post-op Assessment: Report given to PACU RN and Post -op Vital signs reviewed and stable  Post vital signs: Reviewed and stable  Complications: No apparent anesthesia complications

## 2011-10-09 NOTE — Anesthesia Preprocedure Evaluation (Addendum)
Anesthesia Evaluation  Patient identified by MRN, date of birth, ID band Patient awake    Reviewed: Allergy & Precautions, H&P , NPO status , Patient's Chart, lab work & pertinent test results, reviewed documented beta blocker date and time   Airway Mallampati: II TM Distance: >3 FB Neck ROM: full    Dental No notable dental hx. (+) Edentulous Upper and Edentulous Lower   Pulmonary neg pulmonary ROS, COPD clear to auscultation  Pulmonary exam normal       Cardiovascular hypertension, On Medications and On Home Beta Blockers + CAD and +CHF + dysrhythmias Atrial Fibrillation regular Tachycardia    Neuro/Psych  Neuromuscular disease Negative Psych ROS   GI/Hepatic negative GI ROS, Neg liver ROS, GERD-  Medicated,  Endo/Other  Negative Endocrine ROS  Renal/GU negative Renal ROS  Genitourinary negative   Musculoskeletal   Abdominal   Peds  Hematology negative hematology ROS (+)   Anesthesia Other Findings   Reproductive/Obstetrics negative OB ROS                           Anesthesia Physical Anesthesia Plan  ASA: IV  Anesthesia Plan: General   Post-op Pain Management:    Induction: Intravenous  Airway Management Planned: Oral ETT  Additional Equipment:   Intra-op Plan:   Post-operative Plan: Extubation in OR  Informed Consent: I have reviewed the patients History and Physical, chart, labs and discussed the procedure including the risks, benefits and alternatives for the proposed anesthesia with the patient or authorized representative who has indicated his/her understanding and acceptance.     Plan Discussed with: CRNA  Anesthesia Plan Comments:        Anesthesia Quick Evaluation

## 2011-10-09 NOTE — Plan of Care (Signed)
Problem: Diagnosis - Type of Surgery Goal: General Surgical Patient Education (See Patient Education module for education specifics)  Outcome: Completed/Met Date Met:  10/09/11 Schduled for Lt hip repair, discussed surgery with Pt and family, family spoke with Dr. Magnus Ivan and verbalize an understanding   Problem: Phase I Progression Outcomes Goal: Pain controlled with appropriate interventions Outcome: Completed/Met Date Met:  10/09/11 PRN meds and Ice pack

## 2011-10-09 NOTE — Consult Note (Signed)
  Plan to proceed to the OR today to stabilize her left femur fracture with an IM nail.  Patient and family understands this as well as the risks and benefits.

## 2011-10-09 NOTE — Anesthesia Procedure Notes (Signed)
Procedure Name: Intubation Date/Time: 10/09/2011 11:46 AM Performed by: Leona Singleton A. Oxygen Delivery Method: Circle System Utilized Preoxygenation: Pre-oxygenation with 100% oxygen Intubation Type: IV induction Ventilation: Mask ventilation without difficulty Laryngoscope Size: Miller and 2 Grade View: Grade I Tube type: Oral Tube size: 7.0 mm Number of attempts: 1 Placement Confirmation: ETT inserted through vocal cords under direct vision,  breath sounds checked- equal and bilateral and positive ETCO2 Secured at: 20 cm Tube secured with: Tape Dental Injury: Teeth and Oropharynx as per pre-operative assessment

## 2011-10-09 NOTE — Progress Notes (Signed)
Family Medicine Teaching Service Attending Note  I interviewed and examined patient [name] and reviewed their tests and x-rays.  I discussed with Dr. Lula Olszewski and reviewed their note for today.  I agree with their assessment and plan.     Additionally  See notes from H&P today

## 2011-10-09 NOTE — ED Provider Notes (Signed)
Medical screening examination/treatment/procedure(s) were performed by non-physician practitioner and as supervising physician I was immediately available for consultation/collaboration.   Laray Anger, DO 10/09/11 0214

## 2011-10-09 NOTE — Progress Notes (Signed)
Family Medicine Teaching Service Daily Progress Note  Subjective: Patient complaining of hip pain.  Objective: Vital signs in last 24 hours: Filed Vitals:   10/08/11 2130 10/08/11 2145 10/08/11 2230 10/09/11 0556  BP: 140/60 160/77 151/74 140/73  Pulse: 69 86 76 93  Temp:   97.9 F (36.6 C) 98.8 F (37.1 C)  TempSrc:   Oral Oral  Resp: 18 22  22   Height:   5\' 1"  (1.549 m)   Weight:   122 lb 3.2 oz (55.43 kg) 123 lb 3.8 oz (55.9 kg)  SpO2: 97% 97%  96%    Intake/Output Summary (Last 24 hours) at 10/09/11 0905 Last data filed at 10/09/11 0602  Gross per 24 hour  Intake      0 ml  Output    200 ml  Net   -200 ml   BP 117/51  Pulse 101  Temp(Src) 99.7 F (37.6 C) (Oral)  Resp 20  Ht 5\' 1"  (1.549 m)  Wt 123 lb 3.8 oz (55.9 kg)  BMI 23.29 kg/m2  SpO2 94% General appearance: cachectic and moaning in pain. Eyes: PERRL, EOMIT Throat: Oral mucosa moist, no lesions. Lungs: clear to auscultation bilaterally Heart: regular rate and rhythm, S1, S2 normal, no murmur, click, rub or gallop Abdomen: soft, non-tender; bowel sounds normal; no masses,  no organomegaly Extremities: Patient with pain upon touching left leg or hip.  No significant edmea of lower extremities.  Pulses: 2+ and symmetric Lab Results:  Basename 10/08/11 1800  WBC 12.7*  HGB 12.9  HCT 39.5  PLT 234    Basename 10/08/11 1800  NA 140  K 4.7  CL 99  CO2 29  GLUCOSE 127*  BUN 21  CREATININE 0.87  CALCIUM 10.5   Micro Results: Recent Results (from the past 240 hour(s))  SURGICAL PCR SCREEN     Status: Normal   Collection Time   10/09/11  5:50 AM      Component Value Range Status Comment   MRSA, PCR NEGATIVE  NEGATIVE  Final    Staphylococcus aureus NEGATIVE  NEGATIVE  Final    Studies/Results:  Medications: I have reviewed the patient's current medications. Scheduled Meds:   . sodium chloride   Intravenous STAT  . calcium-vitamin D  1 tablet Oral BID  . cholecalciferol  1,000 Units Oral  Daily  . fentaNYL  50 mcg Intravenous Once  . fentaNYL  50 mcg Intravenous Once  . ferrous sulfate  325 mg Oral Daily  . folic acid  1 mg Oral Daily  . heparin  5,000 Units Subcutaneous Q8H  .  HYDROmorphone (DILAUDID) injection  0.5 mg Intravenous Once  . isosorbide mononitrate  30 mg Oral Daily  . levothyroxine  75 mcg Oral QAC breakfast  . metoprolol  50 mg Oral Daily  . mirtazapine  7.5 mg Oral QHS  . ondansetron (ZOFRAN) IV  4 mg Intravenous Once  . pantoprazole  40 mg Oral Daily  . polyvinyl alcohol  1 drop Both Eyes QID   Continuous Infusions:   . DISCONTD: HYDROmorphone     PRN Meds:.albuterol, HYDROmorphone (DILAUDID) injection, meclizine, ondansetron (ZOFRAN) IV, polyethylene glycol powder, DISCONTD: HYDROmorphone, DISCONTD:  HYDROmorphone (DILAUDID) injection, DISCONTD:  HYDROmorphone (DILAUDID) injection, DISCONTD: ondansetron (ZOFRAN) IV Assessment: [name] is a 77 y.o. female patient, past medical history of CKD, CAD, diastolic CHF, paroxysmal A-Fib, COPD, Pernicious Anemia, Hypothyroidism, who is Hospital day 1 s/p fall and Left Hip fracture:   Plan: 1) Hip fracture- patient going  to OR today, will continue Dilaudid for pain control post op.  Will plan for PT/OT consult after surgery.  2) Heme- Hemoglobin normal, will follow after surgery.  Continue Iron supplementation.  3) Cardiovascular- CAD and diastolic Heart failure- holding ASA and Plavix for surgery, will re-start when Ortho OK's post op.  Continue home Metoprolol and Imdur.  Re-start Lasix after surgery.  Patient with some elevated BP's, will give hydralazine PRN for now. Continue monitoring on Telemetry. 4) Pulm- COPD, no wheezing on exam today or comoplaints of shortness of breath.  Albuterol PRN.  5) Endocrine- Continue home synthroid for hypothyroidism.  6) Renal: CKD III- creatinine stable, continue monitoring.   7) Psych: Anxiety/depression- continue home remron 8) FEN/GI- continue MIVF, will give  diet after surgery and d/c fluids when patient eating well.  Continue monitoring electrolytes.  9) Prophylaxis: SQ Heparin for DVT PPX, Protonix for GI PPX.  10) Dispo- pending hip surgery and clinical improvement.    LOS: 1 day   Batul Diego 10/09/2011, 9:05 AM

## 2011-10-09 NOTE — Brief Op Note (Signed)
10/08/2011 - 10/09/2011  1:16 PM  PATIENT:  [name]  77 y.o. female  PRE-OPERATIVE DIAGNOSIS:  Left sub-trochanteric femur fracture  POST-OPERATIVE DIAGNOSIS:  left sub-trochanteric femur fracture  PROCEDURE:  Procedure(s): INTRAMEDULLARY (IM) NAIL FEMORAL  SURGEON:  Surgeon(s): Kathryne Hitch  PHYSICIAN ASSISTANT:   ASSISTANTS: none   ANESTHESIA:   general  EBL:  Total I/O In: 800 [I.V.:800] Out: 200 [Urine:75; Blood:125]  BLOOD ADMINISTERED:none  DRAINS: none   LOCAL MEDICATIONS USED:  NONE  SPECIMEN:  No Specimen  DISPOSITION OF SPECIMEN:  N/A  COUNTS:  YES  TOURNIQUET:  * No tourniquets in log *  DICTATION: .Other Dictation: Dictation Number [id number]  PLAN OF CARE: Admit to inpatient   PATIENT DISPOSITION:  PACU - hemodynamically stable.   Delay start of Pharmacological VTE agent (>24hrs) due to surgical blood loss or risk of bleeding:  {YES/NO/NOT APPLICABLE:20182

## 2011-10-09 NOTE — Op Note (Unsigned)
[name], Clements NO.:  [id number]  MEDICAL RECORD NO.:  [id number]  LOCATION:  WOTF                         FACILITY:  [facility]  PHYSICIAN:  Vanita Panda. Magnus Ivan, M.D.DATE OF BIRTH:  06-Jan-1917  DATE OF PROCEDURE:  10/09/2011 DATE OF DISCHARGE:                              OPERATIVE REPORT   PREOPERATIVE DIAGNOSIS:  Left subtrochanteric femur fracture.  POSTOPERATIVE DIAGNOSIS:  Left subtrochanteric femur fracture.  PROCEDURES:  Open reduction and internal fixation of left subtrochanteric femur fracture using intramedullary hip screw.  IMPLANTS:  Smith and Nephew 10 x 36 left intramedullary nail with 90 mm lag screw and two distal interlocks.  SURGEON:  Vanita Panda. Magnus Ivan, MD  ANESTHESIA:  General.  ANTIBIOTICS:  One gram of IV Ancef.  BLOOD LOSS:  Less than 200 mL.  COMPLICATIONS:  None.  INDICATIONS:  [name] is a 77 year old female, community ambulator, who sustained a mechanical fall yesterday evening.  She was found to have subtrochanteric femur fracture who is graciously admitted to the [facility] Medicine Service for definitive surgical fixation today.  The risks and benefits of this were explained to her and her family and they do wish to proceed with surgery given the nature of this fracture and the severity of her pain.  PROCEDURE DESCRIPTION:  After informed consent was obtained, appropriate left hip was marked.  She was brought to the operating room and placed supine on the fracture table.  General anesthesia was then obtained.  A perineal post was placed.  Her left leg was placed in in-line skeletal traction and the right hip was in abduction stirrup, flexed and abducted out of field with appropriate padding in all areas.  A time-out was called.  She was identified as correct patient and correct left hip.  We prepped the hip with DuraPrep and a sterile shower curtain was placed as well.  Incision was then made proximal to the  greater trochanter and a guidepin was inserted to the tip of the greater trochanter in an antegrade fashion.  We opened up the femoral canal with the initiating reamer and then placed a ball-tipped guide pin in an antegrade fashion across the fracture down toward the knee.  We took a measurement off this and chose a 10 x 36 intramedullary nail.  We placed this in an antegrade fashion over the guide rod without reaming.  We then removed the guide wire.  Using the outrigger guide,we then placed a second stab incision further down the femur.  We were to able to place a guidewire and then drill for a 90-mm lag screw.  We placed the lag screw in the compression component.  This was all verified under direct fluoroscopic guidance.  Distally, we made two small stab incisions at the distal femur for interlocking screws to be placed from a lateral to medial direction.  All instrumentation was then removed.  We cleaned all small incision sites with normal saline. We closed the deep tissue with 0 Vicryl followed by 2-0 Vicryl in the subcutaneous tissue and interrupted staples on the skin on all incisions.  A well-padded sterile dressing was applied.  She was taken off the fracture table and to the recover  room.     Vanita Panda. Magnus Ivan, M.D.     CYB/MEDQ  D:  10/09/2011  T:  10/09/2011  Job:  [id number]

## 2011-10-09 NOTE — H&P (Signed)
Family Medicine Teaching Service Attending Note  I interviewed and examined patient [name] and reviewed their tests and x-rays.  I discussed with Dr. Madolyn Frieze and reviewed their note for today.  I agree with their assessment and plan.     Additionally  [name] is currently sleeping but when awakened to voice complains of severe hip pain  Difficult situation.  She is at very high risk for perioperative complications due to age, cardiac and renal conditions. She does not have any modifiable risk factors.  However will be in severe pain unless hip fracture is fixed.   Discussed with family who understand and would like to proceed with surgery Would continue Betablockers IV for possible cardiac protection Need to clarify code status with her HCPOA.

## 2011-10-09 NOTE — Anesthesia Postprocedure Evaluation (Signed)
  Anesthesia Post-op Note  Patient: [name]  Procedure(s) Performed:  INTRAMEDULLARY (IM) NAIL FEMORAL  Patient Location: PACU  Anesthesia Type: General  Level of Consciousness: awake  Airway and Oxygen Therapy: Patient Spontanous Breathing and Patient connected to nasal cannula oxygen  Post-op Pain: mild  Post-op Assessment: Post-op Vital signs reviewed, Patient's Cardiovascular Status Stable and Respiratory Function Stable  Post-op Vital Signs: Reviewed and stable  Complications: No apparent anesthesia complications

## 2011-10-09 NOTE — Consult Note (Signed)
Reason for Consult:  Left femur fracture Referring Physician: ER MD  [name] is an 77 y.o. female.  HPI: 77 yo female sustained mechanical fall.  Found to have left subtroch femur fracture after being brought to the ER.  Graciously admitted to [facility] Medicine Service.  Past Medical History  Diagnosis Date  . Congenital anisocoria   . Hallux valgus   . Lung nodule 1999    stable on followup CT  . DVT of lower extremity (deep venous thrombosis) 7/200o  . 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 Lumbar stenosis 2001  . S/P cardiac cath 08/2005    stents open  . 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 S/P endoscopy 12/07    mild gastritis  . Normal exercise sestamibi stress test 4/07  . Atrial fibrillation   . Anxiety   . COPD (chronic obstructive pulmonary disease)   . GERD (gastroesophageal reflux disease)   . Angina   . Shortness of breath   . Chronic kidney disease   . Coronary artery disease   . Hypertension   . CHF (congestive heart failure)   . Hypothyroidism   . Arthritis   . Pneumonia   . Anemia   . Neuromuscular disorder   . Depression     Past Surgical History  Procedure Date  . Thyroidectomy, partial 1933  . Coronary angioplasty with stent placement 9/99  . Appendectomy   . Cervical discectomy   . Cholecystectomy   . Coronary angioplasty with stent placement 2000  . Rotator cuff repair   . Orif hip fracture 2001  . Splenectomy     No family history on file.  Social History:  reports that she quit smoking about 40 years ago. Her smokeless tobacco use includes Chew. Her alcohol and drug histories not on file.  Allergies:  Allergies  Allergen Reactions  . Atorvastatin     REACTION: Palpitations  . Carbidopa W/Levodopa     REACTION: nausea, diarrhea, and vivid dreams  . Codeine     REACTION: Nausea  . Duloxetine     REACTION: Constipation  . Gabapentin     REACTION: Pruritic rash, nausea, lightheadedness  . Sulfamethoxazole  W/Trimethoprim     REACTION: nausea  . Trimethoprim Other (See Comments)    Per mar  . Venlafaxine     REACTION: orthostasis    Medications: I have reviewed the patient's current medications.  Results for orders placed during the hospital encounter of 10/08/11 (from the past 48 hour(s))  PROTIME-INR     Status: Normal   Collection Time   10/08/11  6:00 PM      Component Value Range Comment   Prothrombin Time 14.0  11.6 - 15.2 (seconds)    INR 1.06  0.00 - 1.49    APTT     Status: Normal   Collection Time   10/08/11  6:00 PM      Component Value Range Comment   aPTT 29  24 - 37 (seconds)   CBC     Status: Abnormal   Collection Time   10/08/11  6:00 PM      Component Value Range Comment   WBC 12.7 (*) 4.0 - 10.5 (K/uL)    RBC 4.06  3.87 - 5.11 (MIL/uL)    Hemoglobin 12.9  12.0 - 15.0 (g/dL)    HCT 21.3  08.6 - 57.8 (%)    MCV 97.3  78.0 - 100.0 (fL)  MCH 31.8  26.0 - 34.0 (pg)    MCHC 32.7  30.0 - 36.0 (g/dL)    RDW 16.1  09.6 - 04.5 (%)    Platelets 234  150 - 400 (K/uL)   DIFFERENTIAL     Status: Abnormal   Collection Time   10/08/11  6:00 PM      Component Value Range Comment   Neutrophils Relative 75  43 - 77 (%)    Neutro Abs 9.5 (*) 1.7 - 7.7 (K/uL)    Lymphocytes Relative 15  12 - 46 (%)    Lymphs Abs 1.9  0.7 - 4.0 (K/uL)    Monocytes Relative 8  3 - 12 (%)    Monocytes Absolute 1.1 (*) 0.1 - 1.0 (K/uL)    Eosinophils Relative 2  0 - 5 (%)    Eosinophils Absolute 0.2  0.0 - 0.7 (K/uL)    Basophils Relative 0  0 - 1 (%)    Basophils Absolute 0.0  0.0 - 0.1 (K/uL)   COMPREHENSIVE METABOLIC PANEL     Status: Abnormal   Collection Time   10/08/11  6:00 PM      Component Value Range Comment   Sodium 140  135 - 145 (mEq/L)    Potassium 4.7  3.5 - 5.1 (mEq/L) MODERATE HEMOLYSIS   Chloride 99  96 - 112 (mEq/L)    CO2 29  19 - 32 (mEq/L)    Glucose, Bld 127 (*) 70 - 99 (mg/dL)    BUN 21  6 - 23 (mg/dL)    Creatinine, Ser 4.09  0.50 - 1.10 (mg/dL)    Calcium 81.1   8.4 - 10.5 (mg/dL)    Total Protein 8.2  6.0 - 8.3 (g/dL)    Albumin 4.6  3.5 - 5.2 (g/dL)    AST 32  0 - 37 (U/L) MODERATE HEMOLYSIS   ALT 14  0 - 35 (U/L)    Alkaline Phosphatase 79  39 - 117 (U/L)    Total Bilirubin 0.4  0.3 - 1.2 (mg/dL)    GFR calc non Af Amer 56 (*) >90 (mL/min)    GFR calc Af Amer 65 (*) >90 (mL/min)     Dg Chest 1 View  10/08/2011  *RADIOLOGY REPORT*  Clinical Data: Fall.  Hip pain.  CHEST - 1 VIEW  Comparison: Two-view chest 10/07/2011.  Findings: The patient is rotated to the right.  Emphysematous changes are again seen.  No focal airspace disease is evident. Marked degenerative changes are again seen at the right shoulder.  IMPRESSION:  1.  No acute cardiopulmonary disease. 2.  Emphysema.  Original Report Authenticated By: Jamesetta Orleans. MATTERN, M.D.   Dg Chest 2 View  10/07/2011  *RADIOLOGY REPORT*  Clinical Data: Cough.  CHEST - 2 VIEW  Comparison: 11/17/2010  Findings: Mild cardiomegaly remains stable. Pulmonary hyperinflation again demonstrated.  No evidence of pulmonary infiltrate or edema.  No evidence of pleural effusion.  No mass or lymphadenopathy identified.  Pectus excavatum again noted.  IMPRESSION:  1.  Stable mild cardiomegaly and COPD.  No active lung disease. 2.  Pectus excavatum.  Original Report Authenticated By: Danae Orleans, M.D.   Dg Hip Complete Left  10/08/2011  *RADIOLOGY REPORT*  Clinical Data: Hip pain  LEFT HIP - COMPLETE 2+ VIEW  Comparison: None.  Findings: Comminuted subtrochanteric left proximal femur fracture with anterior displacement of the dominant fracture fragment and apex anterior angulation.  Status post ORIF of prior right proximal femur fracture.  IMPRESSION: Comminuted subtrochanteric left proximal femur fracture, as described above.  Original Report Authenticated By: Charline Bills, M.D.    ROS Blood pressure 151/74, pulse 76, temperature 97.9 F (36.6 C), temperature source Oral, resp. rate 22, height 5\' 1"  (1.549 m),  weight 55.43 kg (122 lb 3.2 oz), SpO2 97.00%. Physical Exam  Musculoskeletal:       Left hip: She exhibits decreased range of motion, tenderness, bony tenderness and swelling.    Assessment/Plan: Left subtrochanteric femur fracture 1) plan for OR later today to stabilize fracture.  Klaus Casteneda Y 10/09/2011, 1:21 AM

## 2011-10-10 ENCOUNTER — Encounter (HOSPITAL_COMMUNITY): Payer: Self-pay | Admitting: Orthopaedic Surgery

## 2011-10-10 LAB — BASIC METABOLIC PANEL
BUN: 32 mg/dL — ABNORMAL HIGH (ref 6–23)
Chloride: 104 mEq/L (ref 96–112)
GFR calc Af Amer: 53 mL/min — ABNORMAL LOW (ref >90)
Glucose, Bld: 134 mg/dL — ABNORMAL HIGH (ref 70–99)
Potassium: 3.8 mEq/L (ref 3.5–5.1)
Sodium: 137 mEq/L (ref 135–145)

## 2011-10-10 LAB — CBC
HCT: 21.8 % — ABNORMAL LOW (ref 36.0–46.0)
Hemoglobin: 7.1 g/dL — ABNORMAL LOW (ref 12.0–15.0)
WBC: 15.2 10*3/uL — ABNORMAL HIGH (ref 4.0–10.5)

## 2011-10-10 LAB — PREPARE RBC (CROSSMATCH)

## 2011-10-10 LAB — HEMOGLOBIN AND HEMATOCRIT, BLOOD: HCT: 22.7 % — ABNORMAL LOW (ref 36.0–46.0)

## 2011-10-10 MED ORDER — ACETAMINOPHEN 325 MG PO TABS
650.0000 mg | ORAL_TABLET | Freq: Four times a day (QID) | ORAL | Status: DC
  Administered 2011-10-10 – 2011-10-12 (×8): 650 mg via ORAL
  Filled 2011-10-10 (×2): qty 2
  Filled 2011-10-10: qty 1
  Filled 2011-10-10 (×3): qty 2
  Filled 2011-10-10: qty 1
  Filled 2011-10-10 (×2): qty 2

## 2011-10-10 MED ORDER — ENOXAPARIN SODIUM 40 MG/0.4ML ~~LOC~~ SOLN: 40.0000 mg | SUBCUTANEOUS | Status: DC

## 2011-10-10 MED ORDER — METOPROLOL SUCCINATE ER 25 MG PO TB24
25.0000 mg | ORAL_TABLET | Freq: Every day | ORAL | Status: DC
  Administered 2011-10-10 – 2011-10-12 (×3): 25 mg via ORAL
  Filled 2011-10-10 (×3): qty 1

## 2011-10-10 MED ORDER — ENOXAPARIN SODIUM 30 MG/0.3ML ~~LOC~~ SOLN
30.0000 mg | SUBCUTANEOUS | Status: DC
  Administered 2011-10-10 – 2011-10-12 (×3): 30 mg via SUBCUTANEOUS
  Filled 2011-10-10 (×3): qty 0.3

## 2011-10-10 MED ORDER — SODIUM CHLORIDE 0.9 % IV SOLN
INTRAVENOUS | Status: DC
  Administered 2011-10-10 – 2011-10-11 (×3): via INTRAVENOUS

## 2011-10-10 NOTE — Progress Notes (Signed)
I have seen and examined this patient. I have discussed with Dr Konrad Dolores.  I agree with their findings and plans as documented in their progress note for today. Plan to transfuse 1 unit PRBC for blood loss after displaced subtrochanteric fracture associated bleed.  Get foley cath out as soon as possible.  Start Bowel regiment for BM every 2 days while on acute opiate therapy.  Lovenox for DVT prophylaxis for next 10 to 14 days.

## 2011-10-10 NOTE — Progress Notes (Signed)
PT Cancellation Note  Evaluation cancelled today secondary to medical issues. PT checked on pt 3 x today in attempt to see pt. Per RN pt was having difficulty (elevated HR, decreased saturation) today secondary to acute blood loss. She has been improving this afternoon as she has received her infusion however still borderline for PT. Will check back in the AM. PT spoke with pt and taught pt ankle pumps to perform this afternoon.  Thank you!  Janyth Riera (Beverely Pace) Carleene Mains PT, DPT Acute Rehabilitation [phone]

## 2011-10-10 NOTE — Progress Notes (Signed)
Subjective: 1 Day Post-Op Procedure(s) (LRB): INTRAMEDULLARY (IM) NAIL FEMORAL (Left) Patient reports pain as moderate.  Acute blood loss anemia.  Objective: Vital signs in last 24 hours: Temp:  [97.5 F (36.4 C)-99.2 F (37.3 C)] 99.2 F (37.3 C) (12/09 2223) Pulse Rate:  [66-101] 94  (12/09 2223) Resp:  [13-22] 19  (12/09 2223) BP: (93-136)/(43-59) 115/55 mmHg (12/09 2223) SpO2:  [91 %-99 %] 95 % (12/09 2223)  Intake/Output from previous day: 12/09 0701 - 12/10 0700 In: 1160 [I.V.:1160] Out: 300 [Urine:175; Blood:125] Intake/Output this shift:     Basename 10/10/11 1004 10/10/11 0635 10/08/11 1800  HGB 7.4* 7.1* 12.9    Basename 10/10/11 1004 10/10/11 0635 10/08/11 1800  WBC -- 15.2* 12.7*  RBC -- 2.22* 4.06  HCT 22.7* 21.8* --  PLT -- 159 234    Basename 10/10/11 0635 10/08/11 1800  NA 137 140  K 3.8 4.7  CL 104 99  CO2 25 29  BUN 32* 21  CREATININE 1.03 0.87  GLUCOSE 134* 127*  CALCIUM 8.0* 10.5    Basename 10/08/11 1800  LABPT --  INR 1.06    Sensation intact distally Intact pulses distally Incision: moderate drainage Compartment soft  Assessment/Plan: 1 Day Post-Op Procedure(s) (LRB): INTRAMEDULLARY (IM) NAIL FEMORAL (Left) Up with therapy, TDWB only left leg. Transfusion per primary team  Anedra Penafiel Y 10/10/2011, 12:07 PM

## 2011-10-10 NOTE — Progress Notes (Signed)
Clinical Child psychotherapist (CSW) completed psychosocial assessment with pt family which can be found in pt shadow chart. Pt at bedside asleep; CSW spoke with pt son and daughter in law in the room regarding pt possibly needing skilled nursing facility placement. Family in agreement to with what PT/OT will recommend and informed CSW that they would like pt to go to [facility] skilled nursing facility for short term rehab. CSW informed family that once PT made recommendation CSW would fax pt information to [facility] however encouraged family to have a back up plan in case [facility] is not available. Family pleasant and helpful and appreciate assistance with placement.   CSW will continue to follow and proceed with assisting with placement once appropriate recommendation is made. Theresia Bough, MSW, Theresia Majors [phone]

## 2011-10-10 NOTE — Progress Notes (Signed)
Family Medicine Teaching Service [facility] Progress Note  Patient name: [name] Medical record number: [id number] Date of birth: 20-Jul-1917 Age: 77 y.o. Gender: female    LOS: 2 days   Primary Care Provider: Tobin Chad, MD  Overnight Events: No acute events overnight. Some Delirium noted per nursing .Pain well controlled. Pain exacerbated w/ movement of L hip. Slept well. Oriented x3 this am.    Objective: Vital signs in last 24 hours: Temp:  [97.5 F (36.4 C)-99.7 F (37.6 C)] 99.2 F (37.3 C) (12/09 2223) Pulse Rate:  [66-101] 94  (12/09 2223) Resp:  [13-22] 19  (12/09 2223) BP: (93-136)/(43-59) 115/55 mmHg (12/09 2223) SpO2:  [91 %-99 %] 95 % (12/09 2223)  Wt Readings from Last 3 Encounters:  10/09/11 123 lb 3.8 oz (55.9 kg)  10/09/11 123 lb 3.8 oz (55.9 kg)  09/27/11 125 lb (56.7 kg)     Current Facility-Administered Medications  Medication Dose Route Frequency Provider Last Rate Last Dose  . 0.9 %  sodium chloride infusion   Intravenous STAT Grant [place], [place] 20 mL/hr at 10/09/11 0405    . albuterol (PROVENTIL HFA;VENTOLIN HFA) 108 (90 BASE) MCG/ACT inhaler 2 puff  2 puff Inhalation Q4H PRN Lucianne Muss Park, MD      . calcium-vitamin D (OSCAL WITH D) 500-200 MG-UNIT per tablet 1 tablet  1 tablet Oral BID Priscella Mann, MD   1 tablet at 10/09/11 2108  . ceFAZolin (ANCEF) IVPB 1 g/50 mL premix  1 g Intravenous Q8H Vanita Panda Blackman   1 g at 10/10/11 4098  . cholecalciferol (VITAMIN D) tablet 1,000 Units  1,000 Units Oral Daily Lucianne Muss Park, MD      . ferrous sulfate tablet 325 mg  325 mg Oral Daily Lucianne Muss Park, MD      . folic acid (FOLVITE) tablet 1 mg  1 mg Oral Daily Lucianne Muss Park, MD      . heparin injection 5,000 Units  5,000 Units Subcutaneous Q8H Shelly Flatten, MD   5,000 Units at 10/10/11 [phone]  . HYDROmorphone (DILAUDID) injection 0.5 mg  0.5 mg Intravenous Q2H PRN Shelly Flatten, MD   0.5 mg at 10/09/11 1102  . isosorbide mononitrate  (IMDUR) 24 hr tablet 30 mg  30 mg Oral Daily Priscella Mann, MD   30 mg at 10/09/11 1054  . levothyroxine (SYNTHROID, LEVOTHROID) tablet 75 mcg  75 mcg Oral QAC breakfast Lucianne Muss Park, MD      . meclizine (ANTIVERT) tablet 25 mg  25 mg Oral Q6H PRN Lucianne Muss Park, MD      . methocarbamol (ROBAXIN) tablet 500 mg  500 mg Oral Q6H PRN Kathryne Hitch      . metoprolol (TOPROL-XL) 24 hr tablet 50 mg  50 mg Oral Daily Priscella Mann, MD   50 mg at 10/09/11 1054  . mirtazapine (REMERON) tablet 7.5 mg  7.5 mg Oral QHS Lucianne Muss Park, MD   7.5 mg at 10/09/11 2108  . ondansetron (ZOFRAN) injection 4 mg  4 mg Intravenous Q6H PRN Shelly Flatten, MD   4 mg at 10/09/11 1844  . oxyCODONE (Oxy IR/ROXICODONE) immediate release tablet 5 mg  5 mg Oral Q3H PRN Kathryne Hitch   5 mg at 10/10/11 0100  . pantoprazole (PROTONIX) EC tablet 40 mg  40 mg Oral Daily Lucianne Muss Park, MD      . polyethylene glycol powder (GLYCOLAX/MIRALAX) container 17 g  17 g Oral Daily PRN  Lucianne Muss Park, MD      . polyvinyl alcohol (LIQUIFILM TEARS) 1.4 % ophthalmic solution 1 drop  1 drop Both Eyes QID Priscella Mann, MD   1 drop at 10/09/11 2108  . DISCONTD: HYDROmorphone (DILAUDID) injection 0.25-0.5 mg  0.25-0.5 mg Intravenous Q5 min PRN W. Autumn Patty, MD      . DISCONTD: meperidine (DEMEROL) injection 6.25-12.5 mg  6.25-12.5 mg Intravenous Q5 min PRN W. Autumn Patty, MD      . DISCONTD: promethazine (PHENERGAN) injection 6.25-12.5 mg  6.25-12.5 mg Intravenous Q15 min PRN Zenon Mayo, MD      . DISCONTD: sodium chloride irrigation 0.9 %    PRN Kathryne Hitch   1 application at 10/09/11 1213   Facility-Administered Medications Ordered in Other Encounters  Medication Dose Route Frequency Provider Last Rate Last Dose  . DISCONTD: acetaminophen (OFIRMEV) IVPB    PRN Tresa Endo A. [place]   1,000 mg at 10/09/11 1137  . DISCONTD: ceFAZolin (ANCEF) IVPB 1 g/50 mL premix    PRN Kelly A. Tonkin   1 g at  10/09/11 1137  . DISCONTD: esmolol (BREVIBLOC) injection    PRN Tresa Endo A. Tonkin   20 mg at 10/09/11 1154  . DISCONTD: fentaNYL (SUBLIMAZE) injection    PRN Tresa Endo A. [place]kin   50 mcg at 10/09/11 1146  . DISCONTD: glycopyrrolate (ROBINUL) injection    PRN Charlotta Newton Bilotta   0.4 mg at 10/09/11 1250  . DISCONTD: lactated ringers infusion    Continuous PRN Kelly A. Tonkin      . DISCONTD: metoprolol (LOPRESSOR) injection    PRN Charlotta Newton Bilotta   1 mg at 10/09/11 1255  . DISCONTD: neostigmine (PROSTIGMINE) injection   Intravenous PRN Charlotta Newton Bilotta   2 mg at 10/09/11 1250  . DISCONTD: ondansetron (ZOFRAN) injection    PRN Charlotta Newton Bilotta   4 mg at 10/09/11 1250  . DISCONTD: phenylephrine (NEO-SYNEPHRINE) 0.04 mg/mL in dextrose 5 % 250 mL infusion  10 mg  Continuous PRN Kelly A. Tonkin 15 mL/hr at 10/09/11 1230 10 mcg/min at 10/09/11 1230  . DISCONTD: phenylephrine (NEO-SYNEPHRINE) injection    PRN Tresa Endo A. [place]kin   40 mcg at 10/09/11 1154  . DISCONTD: propofol (DIPRIVAN) 10 MG/ML infusion    PRN Kelly A. Tonkin   50 mg at 10/09/11 1146  . DISCONTD: rocuronium (ZEMURON) injection    PRN Tresa Endo A. [place]kin   30 mg at 10/09/11 1146     PE: Gen: NAD, Sleeping but arousable. Thin and frail appearing HEENT:MMM, dentures CV: III/VI systolic murmur, RRR NFA:OZHY, normal effort QMV:HQIO, non-painful to palpation Ext/Musc:L hip pain w/ slight movement. No erythema. Dressings CDI Neuro: Awake and Alert X3  Labs/Studies:  Basic Metabolic Panel:    Component Value Date/Time   NA 137 10/10/2011 0635   K 3.8 10/10/2011 0635   CL 104 10/10/2011 0635   CO2 25 10/10/2011 0635   BUN 32* 10/10/2011 0635   CREATININE 1.03 10/10/2011 0635   CREATININE 0.94 06/30/2011 0942   GLUCOSE 134* 10/10/2011 0635   CALCIUM 8.0* 10/10/2011 0635   CBC:    Component Value Date/Time   WBC 15.2* 10/10/2011 0635   HGB 7.1* 10/10/2011 0635   HCT 21.8* 10/10/2011 0635   PLT 159 10/10/2011 0635   MCV 98.2 10/10/2011 0635    NEUTROABS 9.5* 10/08/2011 1800   LYMPHSABS 1.9 10/08/2011 1800   MONOABS 1.1* 10/08/2011 1800   EOSABS 0.2 10/08/2011 1800   BASOSABS  0.0 10/08/2011 1800      Assessment/Plan: [name] is a 77 y.o. female with a past medical history of CKD, CAD, diastolic CHF, paroxysmal A-Fib, COPD, Pernicious Anemia, Hypothyroidism, now S/P who is Hospital day 1 s/p L intramedullary Femoral Nail for hip fracture POD 1.    1) Hip fracture- POD1 S/P L intrabmdullary Femoral Nail. Will continue IV Dilaudid for pain control. Will wean to PO as tolerated. PT/OT consulted.   2) Anemia - Hgb 7.1 this am. Baseline 11. Concern for postop hemorrhage. EBL from surgery 125. Some dilutional effect. BP significantly below baseline of 150s. Will restart fluids of NS at 18ml/hr. Will hold metorpolol and imdur. Will discuss [place] with care team.  Heparin order never DC'd yesterday. Pt has continued to get heparin.  Continue Iron supplementation. Stat H&H and type and screen  3) Cardiovascular- BP concerns as above. CAD and diastolic Heart failure-will continue to hold ASA and Plavix held for bleed risk. Will hold home Metoprolol and Imdur. Will hold Lasix. Will monitor closely for fluid overload. Continue monitoring on Telemetry.   4) Pulm- Not taking deep breaths. Incentive Spirometry ordered. COPD, no wheezing on exam today or comoplaints of shortness of breath. Albuterol PRN.  5) Endocrine- Continue home synthroid for hypothyroidism.   6) Renal: CKD III- creatinine stable, continue monitoring.   7) Psych: Anxiety/depression- continue home remron   8) FEN/GI- IVF at 50, On soft mechanical diet. Continue monitoring electrolytes.   9) Prophylaxis: Hold SQ Heparin until bleeding can be assessed.  Protonix for GI PPX.   10) Dispo- pending clinical improvement.     Signed: Shelly Flatten, MD Family Medicine Resident PGY-1 10/10/2011 9:21 AM

## 2011-10-10 NOTE — Progress Notes (Signed)
OT Cancellation Note  Treatment cancelled today due to medical issues with patient which prohibited therapy. Pt getting blood this afternoon and RN reported decreased sats. Will check on pt next day.  Thanks, Lise Auer

## 2011-10-11 LAB — TYPE AND SCREEN: Unit division: 0

## 2011-10-11 LAB — HEMOGLOBIN AND HEMATOCRIT, BLOOD: HCT: 24.1 % — ABNORMAL LOW (ref 36.0–46.0)

## 2011-10-11 MED ORDER — SENNA 8.6 MG PO TABS
1.0000 | ORAL_TABLET | Freq: Every day | ORAL | Status: DC
  Administered 2011-10-11 – 2011-10-12 (×2): 8.6 mg via ORAL
  Filled 2011-10-11 (×2): qty 1

## 2011-10-11 MED ORDER — POLYETHYLENE GLYCOL 3350 17 G PO PACK
17.0000 g | PACK | Freq: Every day | ORAL | Status: DC
  Administered 2011-10-11 – 2011-10-12 (×2): 17 g via ORAL
  Filled 2011-10-11 (×2): qty 1

## 2011-10-11 MED ORDER — POLYETHYLENE GLYCOL 3350 17 GM/SCOOP PO POWD
17.0000 g | Freq: Every day | ORAL | Status: DC
  Filled 2011-10-11: qty 255

## 2011-10-11 NOTE — Progress Notes (Signed)
Patient ID: [name], female   DOB: [birth year], 77 y.o.   MRN: [id number] S: No acute changes.  Pain to be expected.  Has gotten into a chair  O: VSS Hgb <9 Left leg dressings clean/intact Compartments soft Foot NVI  A: Post IM nail left femur  P: TDWB left femur only

## 2011-10-11 NOTE — Progress Notes (Signed)
Physical Therapy Evaluation Patient Details Name: [name] MRN: [id number] DOB: [birth year] Today's Date: 10/11/2011  Problem List:  Patient Active Problem List  Diagnoses  . GOITER NOS  . HYPOTHYROIDISM, UNSPECIFIED  . HYPERLIPIDEMIA  . Pernicious anemia  . ANXIETY  . PARKINSON'S DISEASE  . NEUROPATHY, IDIOPATHIC PERIPHERAL NOS  . BENIGN POSITIONAL VERTIGO  . HEARING LOSS, SENSORINEURAL, BILATERAL  . HYPERTENSION, BENIGN SYSTEMIC  . CORONARY, ARTERIOSCLEROSIS  . CHF, EJECTION FRACTION > OR = 50%  . COPD  . GASTROESOPHAGEAL REFLUX, NO ESOPHAGITIS  . RENAL DISEASE, CHRONIC, STAGE III  . VULVITIS  . OSTEOARTHRITIS OF SPINE, NOS  . SPINAL STENOSIS OF LUMBAR REGION  . LOW BACK PAIN SYNDROME, SEVERE  . FIBROMYALGIA  . Osteoporosis, unspecified  . HALLUX VALGUS, ACQUIRED  . GAIT, ABNORMAL  . SYMPTOM, INCONTINENCE, URGE  . ATRIAL FIBRILLATION, PAROXYSMAL, HX OF  . Venous insufficiency of leg    Past Medical History:  Past Medical History  Diagnosis Date  . Congenital anisocoria   . Hallux valgus   . Lung nodule 1999    stable on followup CT  . DVT of lower extremity (deep venous thrombosis) 7/200o  . 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 Lumbar stenosis 2001  . S/P cardiac cath 08/2005    stents open  . 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 S/P endoscopy 12/07    mild gastritis  . Normal exercise sestamibi stress test 4/07  . Atrial fibrillation   . Anxiety   . COPD (chronic obstructive pulmonary disease)   . GERD (gastroesophageal reflux disease)   . Angina   . Shortness of breath   . Chronic kidney disease   . Coronary artery disease   . Hypertension   . CHF (congestive heart failure)   . Hypothyroidism   . Arthritis   . Pneumonia   . Anemia   . Neuromuscular disorder   . Depression    Past Surgical History:  Past Surgical History  Procedure Date  . Thyroidectomy, partial 1933  . Coronary angioplasty with stent placement 9/99  .  Appendectomy   . Cervical discectomy   . Cholecystectomy   . Coronary angioplasty with stent placement 2000  . Rotator cuff repair   . Orif hip fracture 2001  . Splenectomy   . Femur im nail 10/09/2011    Procedure: INTRAMEDULLARY (IM) NAIL FEMORAL;  Surgeon: Kathryne Hitch;  Location: [facility] OR;  Service: Orthopedics;  Laterality: Left;    PT Assessment/Plan/Recommendation PT Assessment Clinical Impression Statement: Pt did well with adhering to precautions however needed repeated reminders of TDWB with her Lt. LE. Pt's HR at start of treatment was 105, sitting EOB was 108-113, after transfer pt's HR spiked to ~177 when observed. RN aware. Pt denied SOB or chest pain. Pt was very anxious and in pain during transfer which may have contributed to elevated HR. Her HR decreased quickly to ~108 with sitting rest break. Pt reporting feeling much better with being up in the chair.   PT Recommendation/Assessment: Patient will need skilled PT in the acute care venue PT Problem List: Decreased strength;Decreased activity tolerance;Decreased range of motion;Decreased balance;Decreased mobility;Decreased cognition;Decreased knowledge of use of DME;Decreased safety awareness;Cardiopulmonary status limiting activity;Pain Barriers to Discharge: Decreased caregiver support (at ALF) PT Therapy Diagnosis : Generalized weakness;Acute pain (Difficulty with transfers) PT Plan PT Frequency: Min 3X/week PT Treatment/Interventions: DME instruction;Functional mobility training;Therapeutic activities;Therapeutic exercise;Balance training;Patient/family education PT Recommendation Follow Up Recommendations: Skilled nursing  facility Equipment Recommended: Defer to next venue PT Goals  Acute Rehab PT Goals PT Goal Formulation: With patient Time For Goal Achievement: 2 weeks Pt will go Supine/Side to Sit: with min assist;with HOB 0 degrees PT Goal: Supine/Side to Sit - Progress: Progressing toward goal Pt will  go Sit to Supine/Side: with min assist;with HOB 0 degrees PT Goal: Sit to Supine/Side - Progress: Progressing toward goal Pt will go Sit to Stand: with mod assist PT Goal: Sit to Stand - Progress: Progressing toward goal Pt will go Stand to Sit: with mod assist PT Goal: Stand to Sit - Progress: Progressing toward goal Pt will Transfer Bed to Chair/Chair to Bed: with mod assist PT Transfer Goal: Bed to Chair/Chair to Bed - Progress: Progressing toward goal Pt will Perform Home Exercise Program: with supervision, verbal cues required/provided PT Goal: Perform Home Exercise Program - Progress: Progressing toward goal  PT Evaluation Precautions/Restrictions  Precautions Precautions: Fall Restrictions Weight Bearing Restrictions: Yes LLE Weight Bearing: Touchdown weight bearing Prior Functioning  Home Living Lives With:  (roomate) Receives Help From: Personal care attendant Type of Home: Assisted living Home Layout: One level Home Access: Level entry Bathroom Shower/Tub: Health visitor: Handicapped height Bathroom Accessibility: Yes How Accessible: Accessible via walker Home Adaptive Equipment: Bedside commode/3-in-1;Walker - rolling Prior Function Level of Independence: Requires assistive device for independence;Needs assistance with homemaking Light Housekeeping: Moderate Driving: No Vocation: Retired Comments: Pt previously able to ambulated to meals, bathe self, etc.  Cognition Cognition Arousal/Alertness: Awake/alert Overall Cognitive Status: Impaired Memory: Appears impaired Memory Deficits: Pt unable to remember the day although told 3x, Pt with decreased ability to remember TDWB precautions.  Orientation Level: Oriented to person;Oriented to place;Oriented to situation;Disoriented to time Sensation/Coordination Sensation Light Touch: Impaired Detail Light Touch Impaired Details: Impaired LLE Additional Comments: Pt reporting unable to feel floor under  toes, She was able to discriminate Lt/Rt. with light touch.  Extremity Assessment RLE Assessment RLE Assessment: Exceptions to [place] RLE AROM (degrees) Overall AROM Right Lower Extremity: Deficits RLE Overall AROM Comments: due to arthritis RLE Strength RLE Overall Strength: Deficits RLE Overall Strength Comments: Generalized deconditioning, Grossly >/= 3-/5 LLE Assessment LLE Assessment: Exceptions to WFL LLE AROM (degrees) Overall AROM Left Lower Extremity: Deficits;Due to pain LLE Strength LLE Overall Strength: Deficits;Due to pain LLE Overall Strength Comments: unable to fully assess secondary to pain, inability to move Lt. LE Mobility (including Balance) Bed Mobility Bed Mobility: Yes Supine to Sit: 1: +2 Total assist;HOB elevated (Comment degrees) (pt = 30%, HOB = 30 degrees) Supine to Sit Details (indicate cue type and reason): PT to assist with LE advancement and trunk support, pt limited primarily by pain.  Transfers Transfers: Yes Sit to Stand: 1: +2 Total assist;From bed;With upper extremity assist Sit to Stand Details (indicate cue type and reason): Verbal cues to follow precautions, placement of Bil. UEs. Pt very fearful with movement.  Stand to Sit: 1: +2 Total assist;To chair/3-in-1 (pt = 40%) Stand to Sit Details: Verbal cues to follow precautions, control descent and use armrests.  Stand Pivot Transfers: 1: +2 Total assist (pt = 30%) Stand Pivot Transfer Details (indicate cue type and reason): verbal cues for precautions, pivoting Rt. foot around, PT to direct RW.  Ambulation/Gait Ambulation/Gait: No  Posture/Postural Control Posture/Postural Control: Postural limitations Postural Limitations: significant kyphosis Balance Balance Assessed: Yes Static Sitting Balance Static Sitting - Balance Support: Right upper extremity supported;Feet supported Static Sitting - Level of Assistance: 5: Stand by assistance Exercise  General Exercises - Lower Extremity Ankle  Circles/Pumps: AROM;AAROM;Both;20 reps;Supine;Seated Heel Slides: AAROM;Both;5 reps;Supine (decr. tolerance of Bilaterally.) End of Session PT - End of Session Equipment Utilized During Treatment: Gait belt Activity Tolerance: Patient limited by pain;Treatment limited secondary to medical complications (Comment) (elevated HR) Patient left: in chair;with call bell in reach Nurse Communication: Mobility status for transfers;Need for lift equipment;Weight bearing status General Behavior During Session: [facility] for tasks performed Cognition: Impaired Cognitive Impairment: Decreased memory and recall of precautions.   Sherie Don 10/11/2011, 11:00 AM  Sherie Don) Carleene Mains PT, DPT Acute Rehabilitation [phone]

## 2011-10-11 NOTE — Progress Notes (Signed)
Clinical Child psychotherapist (CSW) confirmed that [facility] will not be able to accept pt back until after short term rehab. CSW informed pt son and daughter in law that [facility] has offered placement when pt medically ready. CSW will continue to follow and assist with dc planning. Theresia Bough, MSW, Theresia Majors [phone]

## 2011-10-11 NOTE — Progress Notes (Signed)
I have seen and examined this patient. I have discussed with Dr Konrad Dolores.  I agree with their findings and plans as documented in their progress note for today.  POD#2 left hip hemiarthroplasty after displaced subtrochanteric fracture.  Adequate pain control currently. Foley cath out.  No overt delirium. On DVT prophylaxis with Lovenox (Plan to continue for total 14 days after surgery). Participating in Physical Therapy.  No BM yet.  Plan: Start Miralax and Sennokot. Continue APAP schedule and oxycodone prn breakthru pain, Continue Lovenox x 14 days.  Anticipate discharge soon to SNF for physical therapy.   Post-op fever, one epidsode: No clinical focus other than productive cough.  Patient actively using IS.  No diagnostic work-up unless recurs.

## 2011-10-11 NOTE — Progress Notes (Signed)
Pt has started showing occasional skipped beats on tele. She became tachycardic during PT & OT sessions (up to 160s), and resolved after. Still alternating between afib and 1st Deg. Still asymptomatic.

## 2011-10-11 NOTE — Progress Notes (Signed)
Pt foley d/c'd 0830. Pt voided on bedpan 1430.

## 2011-10-11 NOTE — Progress Notes (Signed)
Family Medicine Teaching Service [facility] Progress Note  Patient name: [name] Medical record number: [id number] Date of birth: [birth year] Age: 77 y.o. Gender: female    LOS: 3 days   Primary Care Provider: Tobin Chad, MD  Overnight Events: Some complaints of lightheadedness overnight. Tolerating regular diet. Pain well-controlled. Requiring very little by mouth pain medications. Patient feeling much better overall this morning.  Objective: Vital signs in last 24 hours: Temp:  [97.7 F (36.5 C)-100.7 F (38.2 C)] 97.8 F (36.6 C) (12/11 0500) Pulse Rate:  [98-120] 98  (12/11 0500) Resp:  [16-20] 20  (12/11 0500) BP: (99-129)/(63-75) 103/65 mmHg (12/11 0500) SpO2:  [93 %-96 %] 93 % (12/11 0500) FiO2 (%):  [95 %] 95 % (12/10 1606)  Wt Readings from Last 3 Encounters:  10/09/11 123 lb 3.8 oz (55.9 kg)  10/09/11 123 lb 3.8 oz (55.9 kg)  09/27/11 125 lb (56.7 kg)     Current Facility-Administered Medications  Medication Dose Route Frequency Provider Last Rate Last Dose  . 0.9 %  sodium chloride infusion   Intravenous Continuous Shelly Flatten, MD 50 mL/hr at 10/11/11 0222    . acetaminophen (TYLENOL) tablet 650 mg  650 mg Oral Q6H Leighton Roach McDiarmid, MD   650 mg at 10/11/11 0434  . albuterol (PROVENTIL HFA;VENTOLIN HFA) 108 (90 BASE) MCG/ACT inhaler 2 puff  2 puff Inhalation Q4H PRN Lucianne Muss Park, MD      . calcium-vitamin D (OSCAL WITH D) 500-200 MG-UNIT per tablet 1 tablet  1 tablet Oral BID Priscella Mann, MD   1 tablet at 10/10/11 2316  . cholecalciferol (VITAMIN D) tablet 1,000 Units  1,000 Units Oral Daily Priscella Mann, MD   1,000 Units at 10/10/11 1010  . enoxaparin (LOVENOX) injection 30 mg  30 mg Subcutaneous Q24H Burlene Arnt Elder, PHARMD   30 mg at 10/10/11 1354  . ferrous sulfate tablet 325 mg  325 mg Oral Daily Lucianne Muss Park, MD   325 mg at 10/10/11 1010  . folic acid (FOLVITE) tablet 1 mg  1 mg Oral Daily Lucianne Muss Park, MD   1 mg at 10/10/11 1009  .  levothyroxine (SYNTHROID, LEVOTHROID) tablet 75 mcg  75 mcg Oral QAC breakfast Priscella Mann, MD   75 mcg at 10/11/11 0846  . meclizine (ANTIVERT) tablet 25 mg  25 mg Oral Q6H PRN Lucianne Muss Park, MD      . methocarbamol (ROBAXIN) tablet 500 mg  500 mg Oral Q6H PRN Kathryne Hitch      . metoprolol succinate (TOPROL-XL) 24 hr tablet 25 mg  25 mg Oral Daily Shelly Flatten, MD   25 mg at 10/10/11 1448  . mirtazapine (REMERON) tablet 7.5 mg  7.5 mg Oral QHS Lucianne Muss Park, MD   7.5 mg at 10/09/11 2108  . ondansetron (ZOFRAN) injection 4 mg  4 mg Intravenous Q6H PRN Shelly Flatten, MD   4 mg at 10/09/11 1844  . oxyCODONE (Oxy IR/ROXICODONE) immediate release tablet 5 mg  5 mg Oral Q3H PRN Kathryne Hitch   5 mg at 10/11/11 0849  . pantoprazole (PROTONIX) EC tablet 40 mg  40 mg Oral Daily Priscella Mann, MD   40 mg at 10/10/11 1009  . polyethylene glycol powder (GLYCOLAX/MIRALAX) container 17 g  17 g Oral Daily PRN Lucianne Muss Park, MD      . polyvinyl alcohol (LIQUIFILM TEARS) 1.4 % ophthalmic solution 1 drop  1 drop Both Eyes QID Marylene Land  Madolyn Frieze, MD   1 drop at 10/10/11 2315  . DISCONTD: enoxaparin (LOVENOX) injection 40 mg  40 mg Subcutaneous Q24H Ellery Plunk      . DISCONTD: heparin injection 5,000 Units  5,000 Units Subcutaneous Q8H Shelly Flatten, MD   5,000 Units at 10/10/11 [phone]  . DISCONTD: HYDROmorphone (DILAUDID) injection 0.5 mg  0.5 mg Intravenous Q2H PRN Shelly Flatten, MD   0.5 mg at 10/09/11 1102  . DISCONTD: isosorbide mononitrate (IMDUR) 24 hr tablet 30 mg  30 mg Oral Daily Lucianne Muss Park, MD   30 mg at 10/09/11 1054  . DISCONTD: metoprolol (TOPROL-XL) 24 hr tablet 50 mg  50 mg Oral Daily Priscella Mann, MD   50 mg at 10/09/11 1054     PE:  Gen: NAD, Sleeping but arousable. Thin and frail appearing  HEENT:MMM, dentures  CV: III/VI systolic murmur, IRR  RUE:AVWU, normal effort  JWJ:XBJY, non-painful to palpation  Ext/Musc:L hip pain w/ slight movement. No erythema.  Dressings CDI, RLE pain on palpation around the ankle.  Neuro: Awake and Alert X3   Labs/Studies:  none   Assessment/Plan:  [name] is a 77 y.o. female with a past medical history of CKD, CAD, diastolic CHF, paroxysmal A-Fib, COPD, Pernicious Anemia, Hypothyroidism, now S/P who is  s/p L intramedullary Femoral Nail for hip fracture POD 2  1) Hip fracture- POD2 S/P L intrabmdullary Femoral Nail. Requiring little oxycodone and tylenol for pain. Well controlled. PT/OT, Ortho following.   2) Anemia -  Transfused one unit yesterday. Continues to be intermittently tachy. BP stable. Laetst H&H post transfusion was 8.3. Pt w/ some lightheadedness. H&H pending. Continue Iron supplementation.    3) Cardiovascular- Tachycardia concerns as above. CAD and diastolic Heart failure-will continue to hold ASA and Plavix held for bleed risk. Will continue with Metoprolol XL 25mg  Qday. Will hold Lasix. Will monitor closely for fluid overload. Continue monitoring on Telemetry.   4) Pulm- Respiratory status much improved this am. Using Incentive Spirometry regularly. COPD, no wheezing on exam today or complaints of shortness of breath. Albuterol PRN.   5) Endocrine- Continue home synthroid for hypothyroidism.   6) Renal: CKD III- creatinine stable, continue monitoring.   7) Psych: Anxiety/depression- continue home remron   8) FEN/GI- IVF at 50, Advanced to heart healthy diet. Continue monitoring electrolytes.  Discontinued Foley.   9) Prophylaxis: On Lovenox 30mg  TID.  Protonix for GI PPX.   10) Dispo- pending clinical improvement, SW working on SNF placement.     Signed: Shelly Flatten, MD Family Medicine Resident PGY-1 10/11/2011 9:25 AM

## 2011-10-11 NOTE — Plan of Care (Signed)
Problem: Phase II Progression Outcomes Goal: Discharge plan established Pt requires extensive assist with ADLs and ADL mobility. Recommend SNF for further rehab needs after acute discharge

## 2011-10-11 NOTE — Progress Notes (Signed)
Late note: Pt became tachycardic yesterday early afternoon (HR 164 at the peak). The pt was asymptomatic, but o2 sat was 85% on 2L [place]. I increased O2 to 3L and encouraged deep breathing through nose (pt is a mouth-breather), and sats quickly came up to the mid 90's, while pulse dropped to the 120's. Blood administration followed, during/after which pulse trended down to 99-102, and sats held in the upper 90's even when decreased back to 2L O2. Pt rested comfortably throughout.  MD was paged yesterday with this summary of events.

## 2011-10-11 NOTE — Progress Notes (Signed)
Clinical Child psychotherapist (CSW) faxed pt PT evaluation to prior residence, [facility] in order for them to determine whether they can meet pt needs at ALF or if pt will need to go to SNF. Theresia Bough, MSW, Theresia Majors [phone]

## 2011-10-11 NOTE — Progress Notes (Signed)
Occupational Therapy Evaluation Patient Details Name: [name] MRN: [id number] DOB: 09-Jun-1917 Today's Date: 10/11/2011  Problem List:  Patient Active Problem List  Diagnoses  . GOITER NOS  . HYPOTHYROIDISM, UNSPECIFIED  . HYPERLIPIDEMIA  . Pernicious anemia  . ANXIETY  . PARKINSON'S DISEASE  . NEUROPATHY, IDIOPATHIC PERIPHERAL NOS  . BENIGN POSITIONAL VERTIGO  . HEARING LOSS, SENSORINEURAL, BILATERAL  . HYPERTENSION, BENIGN SYSTEMIC  . CORONARY, ARTERIOSCLEROSIS  . CHF, EJECTION FRACTION > OR = 50%  . COPD  . GASTROESOPHAGEAL REFLUX, NO ESOPHAGITIS  . RENAL DISEASE, CHRONIC, STAGE III  . VULVITIS  . OSTEOARTHRITIS OF SPINE, NOS  . SPINAL STENOSIS OF LUMBAR REGION  . LOW BACK PAIN SYNDROME, SEVERE  . FIBROMYALGIA  . Osteoporosis, unspecified  . HALLUX VALGUS, ACQUIRED  . GAIT, ABNORMAL  . SYMPTOM, INCONTINENCE, URGE  . ATRIAL FIBRILLATION, PAROXYSMAL, HX OF  . Venous insufficiency of leg    Past Medical History:  Past Medical History  Diagnosis Date  . Congenital anisocoria   . Hallux valgus   . Lung nodule 1999    stable on followup CT  . DVT of lower extremity (deep venous thrombosis) 7/200o  . 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 Lumbar stenosis 2001  . S/P cardiac cath 08/2005    stents open  . 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 S/P endoscopy 12/07    mild gastritis  . Normal exercise sestamibi stress test 4/07  . Atrial fibrillation   . Anxiety   . COPD (chronic obstructive pulmonary disease)   . GERD (gastroesophageal reflux disease)   . Angina   . Shortness of breath   . Chronic kidney disease   . Coronary artery disease   . Hypertension   . CHF (congestive heart failure)   . Hypothyroidism   . Arthritis   . Pneumonia   . Anemia   . Neuromuscular disorder   . Depression    Past Surgical History:  Past Surgical History  Procedure Date  . Thyroidectomy, partial 1933  . Coronary angioplasty with stent placement 9/99  .  Appendectomy   . Cervical discectomy   . Cholecystectomy   . Coronary angioplasty with stent placement 2000  . Rotator cuff repair   . Orif hip fracture 2001  . Splenectomy   . Femur im nail 10/09/2011    Procedure: INTRAMEDULLARY (IM) NAIL FEMORAL;  Surgeon: Kathryne Hitch;  Location: [facility] OR;  Service: Orthopedics;  Laterality: Left;    OT Assessment/Plan/Recommendation OT Assessment Clinical Impression Statement: Pt demos decline in function with strength, R UE functional use, activity tolerance, balance and safety for ADLs and ADL mobility tasks. Pt would benefit from skilled OT services to address these impairments to increase independence and maximize level of function to eventually return to ALF OT Recommendation/Assessment: Patient will need skilled OT in the acute care venue OT Problem List: Decreased strength;Decreased range of motion;Impaired UE functional use;Pain;Decreased knowledge of precautions;Impaired balance (sitting and/or standing);Decreased knowledge of use of DME or AE;Decreased activity tolerance;Decreased safety awareness;Decreased cognition Barriers to Discharge: Decreased caregiver support OT Therapy Diagnosis : Generalized weakness OT Plan OT Frequency: Min 1X/week OT Treatment/Interventions: Self-care/ADL training;Therapeutic activities;Therapeutic exercise;Neuromuscular education;Energy conservation;DME and/or AE instruction;Patient/family education;Balance training;Cognitive remediation/compensation OT Recommendation Follow Up Recommendations: Skilled nursing facility Equipment Recommended: Defer to next venue Individuals Consulted Consulted and Agree with Results and Recommendations: Patient;Family member/caregiver Family Member Consulted: Pt's daughter in law OT Goals Acute Rehab OT Goals OT Goal Formulation:  With patient Time For Goal Achievement: 2 weeks ADL Goals Pt Will Perform Grooming: with set-up;Sitting at sink Pt Will Perform Lower Body  Bathing: with mod assist;with max assist;Sitting at sink;Sitting, edge of bed Pt Will Perform Upper Body Dressing: with set-up;with supervision;Sitting, bed Pt Will Perform Lower Body Dressing: with max assist;with adaptive equipment;Sitting, bed;Sitting, chair Pt Will Transfer to Toilet: with max assist;with mod assist;with DME Pt Will Perform Toileting - Hygiene: Sitting on 3-in-1 or toilet;with mod assist  OT Evaluation Precautions/Restrictions  Precautions Precautions: Fall Restrictions Weight Bearing Restrictions: Yes LLE Weight Bearing: Touchdown weight bearing Prior Functioning   Prior Function Level of Independence: Independent with basic ADLs;Requires assistive device for independence;Needs assistance with homemaking ADL ADL Eating/Feeding: Not assessed Grooming: Performed;Wash/dry hands;Wash/dry face;Brushing hair;Set up Where Assessed - Grooming: Sitting, chair Upper Body Bathing: Simulated;Set up Where Assessed - Upper Body Bathing: Sitting, chair Lower Body Bathing: +1 Total assistance Upper Body Dressing: Performed;Minimal assistance Where Assessed - Upper Body Dressing: Sitting, chair Lower Body Dressing: +1 Total assistance Toilet Transfer: Not assessed (Unable to do SPT due to pain and anxiety) Toilet Transfer Method: Not assessed Toileting - Clothing Manipulation: Not assessed Toileting - Hygiene: Not assessed Tub/Shower Transfer: Not assessed Equipment Used:  (gait belt) ADL Comments: Pt very anxious and fearful with scooting to edge of chair and duirng  sit - stand fro chair Vision/Perception  Vision - History Baseline Vision: Wears glasses only for reading Patient Visual Report: No change from baseline Perception Perception: Within Functional Limits Cognition Cognition Arousal/Alertness: Awake/alert Overall Cognitive Status: Impaired Memory: Appears impaired Memory Deficits: Unable to adhere to TDWB precations or recall them when asked Orientation  Level: Oriented to person;Oriented to place;Oriented to situation Sensation/Coordination Sensation Light Touch: Appears Intact Light Touch Impaired Details: Impaired LLE Additional Comments: Pt reporting unable to feel floor under toes, She was able to discriminate Lt/Rt. with light touch.  Coordination Gross Motor Movements are Fluid and Coordinated: Yes Fine Motor Movements are Fluid and Coordinated: Yes Extremity Assessment RUE Assessment RUE Assessment: Exceptions to [facility] RUE AROM (degrees) RUE Overall AROM Comments: Pt demos very limited AROM due to old rotator cuff repair LUE Assessment LUE Assessment: Within Functional Limits (MMT 3+/5) Mobility  Bed Mobility Bed Mobility: No Supine to Sit: 1: +2 Total assist;HOB elevated (Comment degrees) (pt = 30%, HOB = 30 degrees) Supine to Sit Details (indicate cue type and reason): PT to assist with LE advancement and trunk support, pt limited primarily by pain.  Transfers Transfers: Yes Sit to Stand: 1: +1 Total assist Sit to Stand Details (indicate cue type and reason): verbal cues for correct handplacement of UEs, TDWB preacautions. pt fearful Stand to Sit: 1: +1 Total assist Stand to Sit Details: Verbal cues to follow precautions, control descent and use armrests.   End of Session OT - End of Session Equipment Utilized During Treatment: Gait belt Activity Tolerance: Patient limited by pain Patient left: in chair;with call bell in reach;with family/visitor present Nurse Communication: Mobility status for transfers;Need for lift equipment General Behavior During Session: [facility] for tasks performed Cognition: Impaired Cognitive Impairment: impaired memeory for WB precautions   Galen Manila 10/11/2011, 12:41 PM

## 2011-10-12 LAB — HEMOGLOBIN AND HEMATOCRIT, BLOOD
HCT: 25.2 % — ABNORMAL LOW (ref 36.0–46.0)
Hemoglobin: 8.2 g/dL — ABNORMAL LOW (ref 12.0–15.0)

## 2011-10-12 LAB — BASIC METABOLIC PANEL
Calcium: 9.3 mg/dL (ref 8.4–10.5)
GFR calc Af Amer: >90 mL/min (ref >90)
GFR calc non Af Amer: 78 mL/min — ABNORMAL LOW (ref >90)
Glucose, Bld: 127 mg/dL — ABNORMAL HIGH (ref 70–99)
Potassium: 4.2 mEq/L (ref 3.5–5.1)
Sodium: 138 mEq/L (ref 135–145)

## 2011-10-12 MED ORDER — OXYCODONE-ACETAMINOPHEN 5-325 MG PO TABS: 1.0000 | ORAL_TABLET | Freq: Four times a day (QID) | ORAL | Status: DC

## 2011-10-12 MED ORDER — TAMSULOSIN HCL 0.4 MG PO CAPS: 0.4000 mg | ORAL_CAPSULE | Freq: Every day | ORAL | Status: DC

## 2011-10-12 MED ORDER — ENOXAPARIN SODIUM 30 MG/0.3ML ~~LOC~~ SOLN: 30.0000 mg | SUBCUTANEOUS | Status: DC

## 2011-10-12 NOTE — Progress Notes (Signed)
Pt d/c to SNF w/ skin intact except as charted previously. Vitals stable. New foley placed and left in per order. Pt sent w/ paper gown.

## 2011-10-12 NOTE — Progress Notes (Signed)
Clinical Social Work-CSW facilitated d/c to [facility] with chart copy and [facility] transport-family completing paperwork-no further needs-sign off-Alayia Meggison-MSW, [phone]

## 2011-10-12 NOTE — Progress Notes (Signed)
Family Medicine Teaching Service [facility] Progress Note  Patient name: [name] Medical record number: [id number] Date of birth: 03-01-1917 Age: 77 y.o. Gender: female    LOS: 4 days   Primary Care Provider: Tobin Chad, MD  Overnight Events: Feels like she is here in the hospital to die. She feels better overall from a medical standpoint but is feeling somewhat depressed. Tolerating a regular diet w/o N/V. No BM. Slept well. Urinating w/o difficulty after removal of foley. Pain is well controlled.    Objective: Vital signs in last 24 hours: Temp:  [97.8 F (36.6 C)-98.4 F (36.9 C)] 97.9 F (36.6 C) (12/12 0515) Pulse Rate:  [101-111] 107  (12/12 0515) Resp:  [18-20] 20  (12/12 0515) BP: (125-174)/(71-81) 153/71 mmHg (12/12 0515) SpO2:  [86 %-100 %] 96 % (12/12 0515)  Wt Readings from Last 3 Encounters:  10/09/11 123 lb 3.8 oz (55.9 kg)  10/09/11 123 lb 3.8 oz (55.9 kg)  09/27/11 125 lb (56.7 kg)     Current Facility-Administered Medications  Medication Dose Route Frequency Provider Last Rate Last Dose  . 0.9 %  sodium chloride infusion   Intravenous Continuous Shelly Flatten, MD 50 mL/hr at 10/11/11 2321    . acetaminophen (TYLENOL) tablet 650 mg  650 mg Oral Q6H Leighton Roach McDiarmid, MD   650 mg at 10/12/11 0553  . albuterol (PROVENTIL HFA;VENTOLIN HFA) 108 (90 BASE) MCG/ACT inhaler 2 puff  2 puff Inhalation Q4H PRN Priscella Mann, MD   2 puff at 10/12/11 0419  . calcium-vitamin D (OSCAL WITH D) 500-200 MG-UNIT per tablet 1 tablet  1 tablet Oral BID Priscella Mann, MD   1 tablet at 10/11/11 2217  . cholecalciferol (VITAMIN D) tablet 1,000 Units  1,000 Units Oral Daily Priscella Mann, MD   1,000 Units at 10/11/11 1134  . enoxaparin (LOVENOX) injection 30 mg  30 mg Subcutaneous Q24H Burlene Arnt Elder, PHARMD   30 mg at 10/11/11 1317  . ferrous sulfate tablet 325 mg  325 mg Oral Daily Priscella Mann, MD   325 mg at 10/11/11 1124  . folic acid (FOLVITE) tablet 1 mg  1  mg Oral Daily Lucianne Muss [place], MD   1 mg at 10/11/11 1124  . levothyroxine (SYNTHROID, LEVOTHROID) tablet 75 mcg  75 mcg Oral QAC breakfast Priscella Mann, MD   75 mcg at 10/11/11 0846  . meclizine (ANTIVERT) tablet 25 mg  25 mg Oral Q6H PRN Lucianne Muss [place], MD      . methocarbamol (ROBAXIN) tablet 500 mg  500 mg Oral Q6H PRN Kathryne Hitch      . metoprolol succinate (TOPROL-XL) 24 hr tablet 25 mg  25 mg Oral Daily Shelly Flatten, MD   25 mg at 10/11/11 1124  . mirtazapine (REMERON) tablet 7.5 mg  7.5 mg Oral QHS Lucianne Muss [place], MD   7.5 mg at 10/11/11 2217  . ondansetron (ZOFRAN) injection 4 mg  4 mg Intravenous Q6H PRN Shelly Flatten, MD   4 mg at 10/09/11 1844  . oxyCODONE (Oxy IR/ROXICODONE) immediate release tablet 5 mg  5 mg Oral Q3H PRN Kathryne Hitch   5 mg at 10/12/11 0553  . pantoprazole (PROTONIX) EC tablet 40 mg  40 mg Oral Daily Priscella Mann, MD   40 mg at 10/11/11 1124  . polyethylene glycol (MIRALAX / GLYCOLAX) packet 17 g  17 g Oral Daily Shelly Flatten, MD   17 g at 10/11/11 1317  .  polyvinyl alcohol (LIQUIFILM TEARS) 1.4 % ophthalmic solution 1 drop  1 drop Both Eyes QID Priscella Mann, MD   1 drop at 10/11/11 2217  . senna (SENOKOT) tablet 8.6 mg  1 tablet Oral Daily Shelly Flatten, MD   8.6 mg at 10/11/11 1318  . DISCONTD: polyethylene glycol powder (GLYCOLAX/MIRALAX) container 17 g  17 g Oral Daily PRN Priscella Mann, MD      . DISCONTD: polyethylene glycol powder (GLYCOLAX/MIRALAX) container 17 g  17 g Oral Daily Shelly Flatten, MD         PE: Gen: NAD, Sleeping but arousable. Thin and frail appearing  HEENT:MMM, dentures  CV: III/VI systolic murmur, IRR  WGN:FAOZ, normal effort  HYQ:MVHQ, non-painful to palpation  Ext/Musc:L hip pain w/ slight movement. No erythema. Dressings CDI, RLE pain on palpation more pronounced around the calf, no swelling.  Mild pain on palpation of LLE Neuro: Awake and Alert  X3   Labs/Studies: none    Assessment/Plan:  [name] is a 77 y.o. female with a past medical history of CKD, CAD, diastolic CHF, paroxysmal A-Fib, COPD, Pernicious Anemia, Hypothyroidism, now S/P L Intramedullary Femoral Nail for hip fracture POD 3   1.  Hip fracture- POD3 S/P L intrabmdullary Femoral Nail. Requiring oxycodone every 1 to 6 hrs. and tylenol for pain. Well controlled. PT/OT, Ortho following.   2.   Anemia - Transfused one unit two days ago. Continues to be tachy. BP elevated. Laetst H&H post transfusion was 8.0. Trending down slightly. Will repeat H&H today. Continue Iron supplementation.   3.   Cardiovascular- Tachycardia and BP concerns as above. Likely due to pain vs low H&H vs physiologic baseline. CAD and diastolic Heart failure. Will discuss restarting ASA and Plavix with care team . Will continue with Metoprolol XL 25mg  Qday. Will hold Lasix. Will monitor closely for fluid overload. Continue monitoring on Telemetry.   4.   Pulm- Respiratory status much improved this am. Using Incentive Spirometry regularly. H/o COPD, no wheezing on exam today or complaints of shortness of breath. Albuterol PRN.   5.   Endocrine- Continue home synthroid for hypothyroidism.   6.   Renal: CKD III- creatinine stable, continue monitoring.   7.   Psych: Anxiety/depression- continue home remron   8.    FEN/GI- SLIV, tolerating heart healthy diet. Voiding well w/o foley.   9.    Prophylaxis: On Lovenox 30mg  Qday. Protonix for GI PPX.   10.  Dispo- pending clinical improvement, Awaiting official word on plcmnt        Signed: Shelly Flatten, MD Family Medicine Resident PGY-1 10/12/2011 8:53 AM

## 2011-10-12 NOTE — Progress Notes (Signed)
Clinical Social Work-CSW left msg for pt son and dtr to confirm d/c to [facility]-also lef msg for SNF and will facilitate d/c as soon as bed/acceptance confirmed. Jodean Lima, [phone]

## 2011-10-12 NOTE — Progress Notes (Signed)
I discussed with Dr Merrell.  I agree with their plans documented in their  Note for today.  

## 2011-10-12 NOTE — Progress Notes (Signed)
Physical Therapy Treatment Patient Details Name: [name] MRN: [id number] DOB: 07-17-1917 Today's Date: 10/12/2011  PT Assessment/Plan  PT - Assessment/Plan Comments on Treatment Session: Pt appeared to have improved pain control today and did not have as elevated HR as yesterday however RN reporting some V-Tach with transfer. Pt gets very anxious with transfers and requires much cuing to decrease anxiety, feel this may be contributing to elevated HR with transfers.  PT Plan: Discharge plan remains appropriate PT Frequency: Min 3X/week Follow Up Recommendations: Skilled nursing facility Equipment Recommended: Defer to next venue PT Goals  Acute Rehab PT Goals PT Goal Formulation: With patient Time For Goal Achievement: 2 weeks Pt will go Supine/Side to Sit: with min assist;with HOB 0 degrees PT Goal: Supine/Side to Sit - Progress: Progressing toward goal Pt will go Sit to Supine/Side: with min assist;with HOB 0 degrees PT Goal: Sit to Supine/Side - Progress: Progressing toward goal Pt will go Sit to Stand: with mod assist PT Goal: Sit to Stand - Progress: Progressing toward goal Pt will go Stand to Sit: with mod assist PT Goal: Stand to Sit - Progress: Progressing toward goal Pt will Transfer Bed to Chair/Chair to Bed: with mod assist PT Transfer Goal: Bed to Chair/Chair to Bed - Progress: Progressing toward goal Pt will Perform Home Exercise Program: with supervision, verbal cues required/provided PT Goal: Perform Home Exercise Program - Progress: Progressing toward goal  PT Treatment Precautions/Restrictions  Precautions Precautions: Fall Restrictions Weight Bearing Restrictions: Yes LLE Weight Bearing: Touchdown weight bearing Mobility (including Balance) Bed Mobility Bed Mobility: Yes Supine to Sit: 1: +2 Total assist;HOB elevated (Comment degrees) (pt = 40%, HOB = 30 degrees) Supine to Sit Details (indicate cue type and reason): PT to assist with LE advancement  and trunk support, pt able to advance Rt. LE, assist needed for Lt. Soothing verbal cues to decrease anxiety Transfers Transfers: Yes Sit to Stand: 1: +2 Total assist;From bed;With upper extremity assist (pt = 40%) Sit to Stand Details (indicate cue type and reason): verbal cues for placement of UEs, TDWB preacautions. Verbal cues for slow breathing to assist with anxiety and fear of transfer Stand to Sit: 1: +2 Total assist;To chair/3-in-1 (pt = 40%) Stand to Sit Details: Verbal cues for precautions, PT to place hand on armrest to assist with control of descent Stand Pivot Transfers: 1: +2 Total assist (pt = 30%) Stand Pivot Transfer Details (indicate cue type and reason): verbal cues for precautions, pivoting Rt. foot around, PT to direct RW. PT to assist with pivoting Rt. Foot Ambulation/Gait Ambulation/Gait: No    Exercise  General Exercises - Lower Extremity Ankle Circles/Pumps: AROM;AAROM;Both;20 reps;Supine;Seated Heel Slides:  (decr. tolerance of Bilaterally.) End of Session PT - End of Session Equipment Utilized During Treatment: Gait belt Activity Tolerance: Patient limited by pain;Treatment limited secondary to medical complications (Comment) (elevated HR) Patient left: in chair;with call bell in reach Nurse Communication: Mobility status for transfers;Need for lift equipment;Weight bearing status General Behavior During Session: [facility] for tasks performed Cognition: Impaired Cognitive Impairment: Decreased memory and recall of precautions. Pt with some generalized confusion.   Sherie Don 10/12/2011, 12:54 PM  Sherie Don) Carleene Mains PT, DPT Acute Rehabilitation [phone]

## 2011-10-12 NOTE — Plan of Care (Signed)
Problem: Phase I Progression Outcomes Goal: Voiding-avoid urinary catheter unless indicated Outcome: Not Applicable Date Met:  10/12/11 SNF w/ foley

## 2011-10-12 NOTE — Discharge Summary (Signed)
Family Medicine Resident Discharge Summary  Patient ID: [name] [id number] 77 y.o. 08/04/1917  Admit date: 10/08/2011  Discharge date and time: No discharge date for patient encounter.   Admitting Physician: Carney Living, MD   Discharge Physician: Dr. Perley Jain  Admission Diagnoses: Closed subtrochanteric fracture of hip [820.22] FALL  Discharge Diagnoses: As above S/P L intramedullary Femoral Nail for L hip fracture  Admission Condition: fair  Discharged Condition: good  Indication for Admission: L hip fracture  Hospital Course:  [name] was admitted on 10/08/2011 after a witnessed fall in her Assisted living facility at [facility]. X-ray confirmed left hip fracture. She was made n.p.o. overnight and taken to the OR on 10/09/2011 where a left intramedullary nail procedure was performed by Dr. Magnus Ivan of orthopedics. Patient tolerated the procedure well and pain was well-controlled. Pain medications were weaned during hospitalization. Respiratory status remained appropriate throughout hospitalization. Pt required limited O2 intervention the day immediately postop. Patient was noted to have a hemoglobin to 7.1 on the day after surgery which was thought to be due to a combination of a small hematoma , surgical blood loss, and delusional effect from IVF. Patient was noted to be somewhat tachycardic with a lower than baseline blood pressure. Patient was given appropriate fluid rehydration and one unit of packed red blood cells. Patient's hemoglobin stabilized over the next 2 days and was at 8.2 at time of discharge. Patient's blood pressure and heart rate were near baseline at time of discharge the patient was sent home on home blood pressure medications. Patient initially with Foley catheter which was removed on 10/11/2011. After patient noted to have urinary retention with post void residual over 400 cc Foley catheter was replaced. Patient started on Flomax but likely  able to have foley removed after voiding trial at nursing home.   Consults: orthopedic surgery. Dr. Magnus Ivan  Significant Diagnostic Studies:  Hip X-Ray: Comminuted subtrochanteric left proximal femur fracture with anterior displacement of the dominant fracture fragment and apex anterior angulation.  Treatments: L Intramedullary Femoral Nail repair of L hip fracture  Discharge Exam: Gen: NAD, Sleeping but arousable. Thin and frail appearing  HEENT:MMM, dentures  CV: III/VI systolic murmur, RRR  WUJ:WJXB, normal effort  JYN:WGNF, non-painful to palpation  Ext/Musc:L hip pain w/ slight movement. No erythema. Dressings CDI, RLE pain on palpation more pronounced around the calf, no swelling. Mild pain on palpation of LLE  Neuro: Awake and Alert X3   Disposition: long term care facility  Patient Instructions:  Current Discharge Medication List    CONTINUE these medications which have NOT CHANGED   Details  acetaminophen (TYLENOL) 650 MG CR tablet Take 650 mg by mouth at bedtime.      albuterol (PROVENTIL HFA;VENTOLIN HFA) 108 (90 BASE) MCG/ACT inhaler Inhale 2 puffs into the lungs every 4 (four) hours as needed. For shortness of breath    aspirin (BABY ASPIRIN) 81 MG chewable tablet Chew 81 mg by mouth daily.      calcium-vitamin D (SM CALCIUM-VITAMIN D) 500-200 MG-UNIT per tablet Take 1 tablet by mouth 2 (two) times daily.      cholecalciferol (VITAMIN D) 1000 UNIT tablet Take 1,000 Units by mouth daily.      clopidogrel (PLAVIX) 75 MG tablet Take 75 mg by mouth daily.      conjugated estrogens (PREMARIN) vaginal cream Place vaginally daily.      CYANOCOBALAMIN IJ Inject 1,000 mg as directed every 30 (thirty) days.      esomeprazole (NEXIUM)  40 MG capsule Take 40 mg by mouth daily.      ferrous sulfate 325 (65 FE) MG tablet Take 325 mg by mouth daily.      folic acid (FOLVITE) 1 MG tablet Take 1 mg by mouth daily.      furosemide (LASIX) 20 MG tablet Take 1 tablet (20 mg  total) by mouth every morning. Qty: 30 tablet, Refills: 11   Associated Diagnoses: Chronic diastolic heart failure    isosorbide mononitrate (IMDUR) 30 MG 24 hr tablet Take 30 mg by mouth daily.      levothyroxine (LEVOTHROID) 75 MCG tablet Take 1 tablet (75 mcg total) by mouth daily before breakfast.   Associated Diagnoses: Unspecified hypothyroidism    meclizine (ANTIVERT) 25 MG tablet Take 25 mg by mouth every 6 (six) hours as needed. For vertigo.     metoprolol (TOPROL XL) 50 MG 24 hr tablet Take 50 mg by mouth daily.      mirtazapine (REMERON) 15 MG tablet Take 7.5 mg by mouth at bedtime.     nitroGLYCERIN (NITROSTAT) 0.4 MG SL tablet Place 0.4 mg under the tongue every 5 (five) minutes x 3 doses as needed. As needed for chest pain.    oxyCODONE-acetaminophen (PERCOCET) 5-325 MG per tablet Take 1 tablet by mouth 4 (four) times daily.      polyethylene glycol (MIRALAX) powder Take 17 g by mouth daily as needed. For constipation    polyvinyl alcohol (LIQUIFILM TEARS) 1.4 % ophthalmic solution Place 1 drop into both eyes 4 (four) times daily. For dry eyes     potassium chloride SA (K-DUR,KLOR-CON) 20 MEQ tablet Take 20 mEq by mouth daily.      simvastatin (ZOCOR) 20 MG tablet Take 20 mg by mouth at bedtime.         Activity: Touch down weight bearing until follow up appointment  Diet: Heart healthy diet Wound Care: May get wet in shower w/ daily dry dressing changes.   Follow-up with Dr. Magnus Ivan of orthopedics on Dec 26th at 9:45    Follow-up Items: 1. Urinary retention. Voiding trial 2. L hip fracture repair  Signed: Shelly Flatten, MD Family Medicine Resident PGY-1 10/12/2011 2:01 PM

## 2011-10-13 NOTE — Discharge Summary (Signed)
I discussed with Dr Konrad Dolores.  I agree with their plans documented in their  Discharge Note for today.

## 2011-10-15 ENCOUNTER — Encounter (HOSPITAL_COMMUNITY): Payer: Self-pay | Admitting: *Deleted

## 2011-10-15 ENCOUNTER — Emergency Department (HOSPITAL_COMMUNITY)
Admission: EM | Admit: 2011-10-15 | Discharge: 2011-10-15 | Disposition: A | Discharge status: Home / Self Care | Payer: Medicare Other | Attending: Emergency Medicine | Admitting: Emergency Medicine

## 2011-10-15 DIAGNOSIS — Z981 Arthrodesis status: Secondary | ICD-10-CM | POA: Insufficient documentation

## 2011-10-15 DIAGNOSIS — M79609 Pain in unspecified limb: Secondary | ICD-10-CM

## 2011-10-15 DIAGNOSIS — M79662 Pain in left lower leg: Secondary | ICD-10-CM

## 2011-10-15 DIAGNOSIS — I10 Essential (primary) hypertension: Secondary | ICD-10-CM | POA: Insufficient documentation

## 2011-10-15 DIAGNOSIS — I2510 Atherosclerotic heart disease of native coronary artery without angina pectoris: Secondary | ICD-10-CM | POA: Insufficient documentation

## 2011-10-15 DIAGNOSIS — Z86718 Personal history of other venous thrombosis and embolism: Secondary | ICD-10-CM | POA: Insufficient documentation

## 2011-10-15 DIAGNOSIS — E039 Hypothyroidism, unspecified: Secondary | ICD-10-CM | POA: Insufficient documentation

## 2011-10-15 DIAGNOSIS — M7989 Other specified soft tissue disorders: Secondary | ICD-10-CM

## 2011-10-15 DIAGNOSIS — F0390 Unspecified dementia without behavioral disturbance: Secondary | ICD-10-CM | POA: Insufficient documentation

## 2011-10-15 DIAGNOSIS — Z79899 Other long term (current) drug therapy: Secondary | ICD-10-CM | POA: Insufficient documentation

## 2011-10-15 LAB — CBC
Hemoglobin: 7.9 g/dL — ABNORMAL LOW (ref 12.0–15.0)
Platelets: 287 10*3/uL (ref 150–400)
RBC: 2.49 MIL/uL — ABNORMAL LOW (ref 3.87–5.11)
WBC: 14.5 10*3/uL — ABNORMAL HIGH (ref 4.0–10.5)

## 2011-10-15 LAB — DIFFERENTIAL
Lymphocytes Relative: 8 % — ABNORMAL LOW (ref 12–46)
Lymphs Abs: 1.1 10*3/uL (ref 0.7–4.0)
Monocytes Relative: 11 % (ref 3–12)
Neutro Abs: 11.6 10*3/uL — ABNORMAL HIGH (ref 1.7–7.7)
Neutrophils Relative %: 80 % — ABNORMAL HIGH (ref 43–77)

## 2011-10-15 LAB — BASIC METABOLIC PANEL
BUN: 23 mg/dL (ref 6–23)
Chloride: 97 mEq/L (ref 96–112)
GFR calc Af Amer: 81 mL/min — ABNORMAL LOW (ref >90)
GFR calc non Af Amer: 70 mL/min — ABNORMAL LOW (ref >90)
Glucose, Bld: 113 mg/dL — ABNORMAL HIGH (ref 70–99)
Potassium: 4.7 mEq/L (ref 3.5–5.1)
Sodium: 135 mEq/L (ref 135–145)

## 2011-10-15 MED ORDER — OXYCODONE-ACETAMINOPHEN 5-325 MG PO TABS: 1.0000 | ORAL_TABLET | ORAL | Status: DC | PRN

## 2011-10-15 MED ORDER — OXYCODONE-ACETAMINOPHEN 5-325 MG PO TABS
1.0000 | ORAL_TABLET | Freq: Once | ORAL | Status: AC
  Administered 2011-10-15: 1 via ORAL
  Filled 2011-10-15: qty 1

## 2011-10-15 NOTE — ED Notes (Signed)
ZOX:[id number]<AV> Expected date:10/15/11<BR> Expected time:11:50 AM<BR> Means of arrival:Ambulance<BR> Comments:<BR> Left leg pain

## 2011-10-15 NOTE — Progress Notes (Signed)
*  PRELIMINARY RESULTS*   Left lower extremity venous Doppler completed.  There is no deep or superficial vein thrombosis noted in the left lower extremity.    Sherren Kerns Renee 10/15/2011, 2:41 PM

## 2011-10-15 NOTE — ED Notes (Signed)
Report called to facility. [facility] called for transport.

## 2011-10-15 NOTE — ED Notes (Signed)
EMS called to [facility] nursing home.  Found patient in bed complaining of left side leg pain Facility gave percocet at 6am with no relief.  Left leg swollen and warm to touch.   Patient is alert and oriented x3.

## 2011-10-15 NOTE — ED Provider Notes (Signed)
History     CSN: [id number] Arrival date & time: 10/15/2011 12:02 PM   First MD Initiated Contact with Patient 10/15/11 1211      Chief Complaint  Patient presents with  . Leg Pain    (Consider location/radiation/quality/duration/timing/severity/associated sxs/prior treatment) Patient is a 77 y.o. female presenting with leg pain. The history is provided by the patient and the nursing home. The history is limited by the condition of the patient (dementia).  Leg Pain    77 year old female is transferred to the emergency department for evaluation of left leg pain. Skin the nursing home for rehabilitation following a proximal femur fracture and was discharged 3 days ago. There is no history of trauma since discharge. She apparently was not getting satisfactory relief with Percocet at the nursing home. Transfer note states the leg is swollen and warm to touch. Past Medical History  Diagnosis Date  . Congenital anisocoria   . Hallux valgus   . Lung nodule 1999    stable on followup CT  . DVT of lower extremity (deep venous thrombosis) 7/200o  . 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 Lumbar stenosis 2001  . S/P cardiac cath 08/2005    stents open  . 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 S/P endoscopy 12/07    mild gastritis  . Normal exercise sestamibi stress test 4/07  . Atrial fibrillation   . Anxiety   . COPD (chronic obstructive pulmonary disease)   . GERD (gastroesophageal reflux disease)   . Angina   . Shortness of breath   . Chronic kidney disease   . Coronary artery disease   . Hypertension   . CHF (congestive heart failure)   . Hypothyroidism   . Arthritis   . Pneumonia   . Anemia   . Neuromuscular disorder   . Depression     Past Surgical History  Procedure Date  . Thyroidectomy, partial 1933  . Coronary angioplasty with stent placement 9/99  . Appendectomy   . Cervical discectomy   . Cholecystectomy   . Coronary angioplasty with stent placement  2000  . Rotator cuff repair   . Orif hip fracture 2001  . Splenectomy   . Femur im nail 10/09/2011    Procedure: INTRAMEDULLARY (IM) NAIL FEMORAL;  Surgeon: Kathryne Hitch;  Location: [facility] OR;  Service: Orthopedics;  Laterality: Left;    No family history on file.  History  Substance Use Topics  . Smoking status: Former Smoker    Quit date: 10/08/1971  . Smokeless tobacco: Current User    Types: Chew  . Alcohol Use: Not on file    OB History    Grav Para Term Preterm Abortions TAB SAB Ect Mult Living                  Review of Systems  Unable to perform ROS: Dementia    Allergies  Atorvastatin; Carbidopa w/levodopa; Codeine; Duloxetine; Gabapentin; Sulfamethoxazole w/trimethoprim; Trimethoprim; and Venlafaxine  Home Medications   Current Outpatient Rx  Name Route Sig Dispense Refill  . ACETAMINOPHEN ER 650 MG PO TBCR Oral Take 650 mg by mouth at bedtime.      . ALBUTEROL SULFATE HFA 108 (90 BASE) MCG/ACT IN AERS Inhalation Inhale 2 puffs into the lungs every 4 (four) hours as needed. For shortness of breath    . ASPIRIN 81 MG PO CHEW Oral Chew 81 mg by mouth daily.      Marland Kitchen CALCIUM CARBONATE-VITAMIN  D 500-200 MG-UNIT PO TABS Oral Take 1 tablet by mouth 2 (two) times daily.      Marland Kitchen VITAMIN D 1000 UNITS PO TABS Oral Take 1,000 Units by mouth daily.      Marland Kitchen CLOPIDOGREL BISULFATE 75 MG PO TABS Oral Take 75 mg by mouth daily.      Marland Kitchen ESTROGENS, CONJUGATED 0.625 MG/GM VA CREA Vaginal Place vaginally daily.      . CYANOCOBALAMIN IJ Injection Inject 1,000 mg as directed every 30 (thirty) days.      Marland Kitchen ENOXAPARIN SODIUM 30 MG/0.3ML [place] SOLN Subcutaneous Inject 0.3 mLs (30 mg total) into the skin daily. Please continue taking for 11 days after discharge, for a total of 14 days postop 3.3 mL 0  . ESOMEPRAZOLE MAGNESIUM 40 MG PO CPDR Oral Take 40 mg by mouth daily.      Marland Kitchen FERROUS SULFATE 325 (65 FE) MG PO TABS Oral Take 325 mg by mouth daily.      Marland Kitchen FOLIC ACID 1 MG PO TABS Oral Take  1 mg by mouth daily.      . FUROSEMIDE 20 MG PO TABS Oral Take 1 tablet (20 mg total) by mouth every morning. 30 tablet 11  . ISOSORBIDE MONONITRATE ER 30 MG PO TB24 Oral Take 30 mg by mouth daily.      Marland Kitchen LEVOTHYROXINE SODIUM 75 MCG PO TABS Oral Take 1 tablet (75 mcg total) by mouth daily before breakfast.    . MECLIZINE HCL 25 MG PO TABS Oral Take 25 mg by mouth every 6 (six) hours as needed. For vertigo.     Marland Kitchen METOPROLOL SUCCINATE ER 50 MG PO TB24 Oral Take 50 mg by mouth daily.      Marland Kitchen MIRTAZAPINE 15 MG PO TABS Oral Take 7.5 mg by mouth at bedtime.     Marland Kitchen NITROGLYCERIN 0.4 MG SL SUBL Sublingual Place 0.4 mg under the tongue every 5 (five) minutes x 3 doses as needed. As needed for chest pain.    . OXYCODONE-ACETAMINOPHEN 5-325 MG PO TABS Oral Take 1 tablet by mouth 4 (four) times daily. 30 tablet 0  . POLYETHYLENE GLYCOL 3350 PO POWD Oral Take 17 g by mouth daily as needed. For constipation    . POLYVINYL ALCOHOL 1.4 % OP SOLN Both Eyes Place 1 drop into both eyes 4 (four) times daily. For dry eyes     . POTASSIUM CHLORIDE CRYS CR 20 MEQ PO TBCR Oral Take 20 mEq by mouth daily.      Marland Kitchen SIMVASTATIN 20 MG PO TABS Oral Take 20 mg by mouth at bedtime.      . TAMSULOSIN HCL 0.4 MG PO CAPS Oral Take 1 capsule (0.4 mg total) by mouth daily after breakfast. 30 capsule 0    SpO2 92%  Physical Exam  Nursing note and vitals reviewed. -year-old female resting comfortably and in no acute distress. Vital signs are normal. Head is normocephalic and atraumatic. PERRLA, EOMI. Oropharynx is clear. Neck is supple and nontender. Back is nontender. Lungs are clear without rales, wheezes, rhonchi. Regular rate and rhythm without murmur. Soft, flat, nontender without masses or hepatosplenomegaly and peristalsis is present. Are of the left femur is healing well. There is ecchymosis in the dependent portions of the left thigh consistent with recent surgery. Left calf is moderately swollen and minimally warm to touch.  There is mild to moderate tenderness palpation over the calf muscles. Neurovascular exam is intact. No cords are palpable. Logic: She is oriented to  person but not place or time. No focal motor or sensory deficits are present.  ED Course  Procedures (including critical care time)   Labs Reviewed  CBC  DIFFERENTIAL  BASIC METABOLIC PANEL   No results found. Results for orders placed during the hospital encounter of 10/15/11  CBC      Component Value Range   WBC 14.5 (*) 4.0 - 10.5 (K/uL)   RBC 2.49 (*) 3.87 - 5.11 (MIL/uL)   Hemoglobin 7.9 (*) 12.0 - 15.0 (g/dL)   HCT 40.9 (*) 81.1 - 46.0 (%)   MCV 97.6  78.0 - 100.0 (fL)   MCH 31.7  26.0 - 34.0 (pg)   MCHC 32.5  30.0 - 36.0 (g/dL)   RDW 91.4  78.2 - 95.6 (%)   Platelets 287  150 - 400 (K/uL)  DIFFERENTIAL      Component Value Range   Neutrophils Relative 80 (*) 43 - 77 (%)   Neutro Abs 11.6 (*) 1.7 - 7.7 (K/uL)   Lymphocytes Relative 8 (*) 12 - 46 (%)   Lymphs Abs 1.1  0.7 - 4.0 (K/uL)   Monocytes Relative 11  3 - 12 (%)   Monocytes Absolute 1.7 (*) 0.1 - 1.0 (K/uL)   Eosinophils Relative 1  0 - 5 (%)   Eosinophils Absolute 0.2  0.0 - 0.7 (K/uL)   Basophils Relative 0  0 - 1 (%)   Basophils Absolute 0.0  0.0 - 0.1 (K/uL)  BASIC METABOLIC PANEL      Component Value Range   Sodium 135  135 - 145 (mEq/L)   Potassium 4.7  3.5 - 5.1 (mEq/L)   Chloride 97  96 - 112 (mEq/L)   CO2 31  19 - 32 (mEq/L)   Glucose, Bld 113 (*) 70 - 99 (mg/dL)   BUN 23  6 - 23 (mg/dL)   Creatinine, Ser 2.13  0.50 - 1.10 (mg/dL)   Calcium 9.7  8.4 - 08.6 (mg/dL)   GFR calc non Af Amer 70 (*) >90 (mL/min)   GFR calc Af Amer 81 (*) >90 (mL/min)   Dg Chest 1 View  10/08/2011  *RADIOLOGY REPORT*  Clinical Data: Fall.  Hip pain.  CHEST - 1 VIEW  Comparison: Two-view chest 10/07/2011.  Findings: The patient is rotated to the right.  Emphysematous changes are again seen.  No focal airspace disease is evident. Marked degenerative changes are again seen at  the right shoulder.  IMPRESSION:  1.  No acute cardiopulmonary disease. 2.  Emphysema.  Original Report Authenticated By: Jamesetta Orleans. MATTERN, M.D.   Dg Chest 2 View  10/07/2011  *RADIOLOGY REPORT*  Clinical Data: Cough.  CHEST - 2 VIEW  Comparison: 11/17/2010  Findings: Mild cardiomegaly remains stable. Pulmonary hyperinflation again demonstrated.  No evidence of pulmonary infiltrate or edema.  No evidence of pleural effusion.  No mass or lymphadenopathy identified.  Pectus excavatum again noted.  IMPRESSION:  1.  Stable mild cardiomegaly and COPD.  No active lung disease. 2.  Pectus excavatum.  Original Report Authenticated By: Danae Orleans, M.D.   Dg Hip Complete Left  10/08/2011  *RADIOLOGY REPORT*  Clinical Data: Hip pain  LEFT HIP - COMPLETE 2+ VIEW  Comparison: None.  Findings: Comminuted subtrochanteric left proximal femur fracture with anterior displacement of the dominant fracture fragment and apex anterior angulation.  Status post ORIF of prior right proximal femur fracture.  IMPRESSION: Comminuted subtrochanteric left proximal femur fracture, as described above.  Original Report Authenticated By: Charline Bills,  M.D.   Dg Femur Left  10/09/2011  *RADIOLOGY REPORT*  Clinical Data: Fracture fixation.  LEFT FEMUR - 2 VIEW  Comparison: Plain films 10/08/2011.  Findings: We are provided with five fluoroscopic spot views of the left femur.  Images demonstrate placement dynamic hip screw and long IM nail with two distal interlocking screws for fixation of a subtrochanteric fracture.  No evidence of complication.  IMPRESSION: ORIF left femur fracture.  Original Report Authenticated By: Bernadene Bell. Maricela Curet, M.D.   Dg Epidural/nerve Root  10/04/2011  *RADIOLOGY REPORT*  Clinical Data:  Lumbosacral spondylosis without myelopathy.  Right L2-L3 foraminal disc protrusion.  The patient has had several epidural steroid injections.  Per the daughter, the most successful injection was a transforaminal  injection.  LUMBAR SELECTIVE NERVE ROOT BLOCK AND TRANSFORAMINAL EPIDURAL STEROID INJECTION  Procedure: After a thorough discussion of risks and benefits of the procedure, written and oral informed consent was obtained.  Verbal consent was obtained by Dr. Carlota Raspberry.  General risks of the procedure included allergic reaction (up to and including death), bleeding, infection, injury to nerves, blood vessels, and adjacent structures.  Specific risks to this procedure include nontarget injection, puncture of a nerve root sleeve, puncture of diseased disk, worsening of back pain, temporary or permanent paresthesias and/or paralysis. We discussed the moderate likelihood of moderate lasting relief/attinment of therapuetic goal.   The patient voiced an understanding and agreed to proceed. Preliminary localization was performed over the right L2-L3 level. The skin was marked, and subsequently prepped and draped in the usual sterile fashion using Betadine soap, or chlorhexidine soap in the case of allergy.  Under sterile technique, with the skin and deep subcutaneous tissues were anesthetized with 1% lidocaine without epinephrine.  Under fluoroscopic guidance, 3-1/2 inches 22- gauge spinal needle was positioned at the superior lateral aspect of the neural foramen, just lateral to the 6 o'clock position of the pedicle.  Position was confirmed on AP and lateral projections and representative images captured.  Injection of 1 mL Omnipaque 180 confirmed position.  No vascular uptake was observed. Subsequently, 120 mg of Depo-Medrol and 2 mL of 1% lidocaine without epinephrine was injected.  The patient tolerated the procedure well and there were no complications.  Patient was observed for 30 minutes prior to discharge under the care of a driver.  Fluoroscopy Time: 1 minute 19 seconds  IMPRESSION:  Technically successful  first right L2  lumbar selective nerve root block and transforaminal epidural steroid injection.  Original Report  Authenticated By: Andreas Newport, M.D.   Dg C-arm 61-120 Min  10/13/2011  C-ARM 61-120 MINUTES  Fluoroscopy was utilized by the requesting physician. No radiograph ic interpretation.  Original Report Authenticated By: [id number]       No diagnosis found.  Venous Doppler has come back negative.. There is no clinical evidence of cellulitis. She will be returned to the nursing home to continue with her rehabilitation.  MDM  Left leg pain need to rule out DVT.        Dione Booze, MD 10/15/11 [phone]

## 2011-10-19 ENCOUNTER — Other Ambulatory Visit: Payer: Self-pay | Admitting: Family Medicine

## 2011-10-19 MED ORDER — SIMVASTATIN 20 MG PO TABS: 20.0000 mg | ORAL_TABLET | Freq: Every day | ORAL | Status: DC

## 2011-10-27 ENCOUNTER — Ambulatory Visit: Payer: Medicare Other

## 2011-12-25 ENCOUNTER — Encounter (HOSPITAL_COMMUNITY): Payer: Self-pay | Admitting: Emergency Medicine

## 2011-12-25 ENCOUNTER — Emergency Department (HOSPITAL_COMMUNITY): Payer: Medicare Other

## 2011-12-25 ENCOUNTER — Emergency Department (HOSPITAL_COMMUNITY)
Admission: EM | Admit: 2011-12-25 | Discharge: 2011-12-26 | Disposition: A | Discharge status: Home / Self Care | Payer: Medicare Other | Attending: Emergency Medicine | Admitting: Emergency Medicine

## 2011-12-25 DIAGNOSIS — W19XXXA Unspecified fall, initial encounter: Secondary | ICD-10-CM | POA: Insufficient documentation

## 2011-12-25 DIAGNOSIS — I10 Essential (primary) hypertension: Secondary | ICD-10-CM | POA: Insufficient documentation

## 2011-12-25 DIAGNOSIS — I509 Heart failure, unspecified: Secondary | ICD-10-CM | POA: Insufficient documentation

## 2011-12-25 DIAGNOSIS — S0101XA Laceration without foreign body of scalp, initial encounter: Secondary | ICD-10-CM

## 2011-12-25 DIAGNOSIS — S0990XA Unspecified injury of head, initial encounter: Secondary | ICD-10-CM | POA: Insufficient documentation

## 2011-12-25 DIAGNOSIS — Y9210 Unspecified residential institution as the place of occurrence of the external cause: Secondary | ICD-10-CM | POA: Insufficient documentation

## 2011-12-25 DIAGNOSIS — E039 Hypothyroidism, unspecified: Secondary | ICD-10-CM | POA: Insufficient documentation

## 2011-12-25 DIAGNOSIS — M5030 Other cervical disc degeneration, unspecified cervical region: Secondary | ICD-10-CM | POA: Insufficient documentation

## 2011-12-25 DIAGNOSIS — S0100XA Unspecified open wound of scalp, initial encounter: Secondary | ICD-10-CM | POA: Insufficient documentation

## 2011-12-25 DIAGNOSIS — R4182 Altered mental status, unspecified: Secondary | ICD-10-CM | POA: Insufficient documentation

## 2011-12-25 NOTE — ED Notes (Signed)
HQI:[id number]<XB> Expected date:<BR> Expected time:<BR> Means of arrival:<BR> Comments:<BR> EMS 211 GC, 89 yof fall w lac to forehead, hemorrhage controlled

## 2011-12-25 NOTE — ED Notes (Signed)
Per EMS pt transported from [facility] after ? Fall, pt found sitting on floor of room with lac to head. Pt alert on arrival, bandage intact.

## 2011-12-25 NOTE — ED Notes (Signed)
Pt sent from [facility] after yelling for help from room, found sitting on floor with lac to head. Pt states she was attempting to get up and change into nightgown. Pt alert, bandage to head intact, c/o pain to head. MAE, follows commands, pupils equal and reactive. Pt does have small bruises in various stages of healing to hands and upper arms.

## 2011-12-26 NOTE — Discharge Instructions (Signed)
Please have your staples removed from your scalp in 10 days.  Return to the ER for signs of infection

## 2011-12-26 NOTE — ED Provider Notes (Signed)
History     CSN: [id number]  Arrival date & time 12/25/11  2141   First MD Initiated Contact with Patient 12/25/11 2154      Chief Complaint  Patient presents with  . Fall    Patient is a 78 y.o. female presenting with fall. The history is provided by the patient, the nursing home and the EMS personnel.  Fall   patient was found on the ground at the nursing home was noted to have a laceration of her scalp.  The patient is alert in the nursing note states she is at baseline mental status.  There's been no reported recent diarrhea or vomiting.  Vital signs in route were normal.  The patient is without complaint except for mild head and neck pain.  Nothing worsens her symptoms.  Nothing improves her symptoms.  Her symptoms are constant.  She denies weakness in her arms or legs.  She has no chest pain or abdominal pain.  She has no nausea or diarrhea.  Past Medical History  Diagnosis Date  . Congenital anisocoria   . Hallux valgus   . Lung nodule 1999    stable on followup CT  . DVT of lower extremity (deep venous thrombosis) 7/200o  . 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 Lumbar stenosis 2001  . S/P cardiac cath 08/2005    stents open  . 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 S/P endoscopy 12/07    mild gastritis  . Normal exercise sestamibi stress test 4/07  . Atrial fibrillation   . Anxiety   . COPD (chronic obstructive pulmonary disease)   . GERD (gastroesophageal reflux disease)   . Angina   . Shortness of breath   . Chronic kidney disease   . Coronary artery disease   . Hypertension   . CHF (congestive heart failure)   . Hypothyroidism   . Arthritis   . Pneumonia   . Anemia   . Neuromuscular disorder   . Depression     Past Surgical History  Procedure Date  . Thyroidectomy, partial 1933  . Coronary angioplasty with stent placement 9/99  . Appendectomy   . Cervical discectomy   . Cholecystectomy   . Coronary angioplasty with stent placement 2000  .  Rotator cuff repair   . Orif hip fracture 2001  . Splenectomy   . Femur im nail 10/09/2011    Procedure: INTRAMEDULLARY (IM) NAIL FEMORAL;  Surgeon: Kathryne Hitch;  Location: [facility] OR;  Service: Orthopedics;  Laterality: Left;    No family history on file.  History  Substance Use Topics  . Smoking status: Former Smoker    Quit date: 10/08/1971  . Smokeless tobacco: Current User    Types: Chew  . Alcohol Use: Not on file    OB History    Grav Para Term Preterm Abortions TAB SAB Ect Mult Living                  Review of Systems  All other systems reviewed and are negative.    Allergies  Atorvastatin; Carbidopa w/levodopa; Codeine; Duloxetine; Gabapentin; Sulfamethoxazole w/trimethoprim; Trimethoprim; and Venlafaxine  Home Medications   Current Outpatient Rx  Name Route Sig Dispense Refill  . ACETAMINOPHEN ER 650 MG PO TBCR Oral Take 650 mg by mouth at bedtime.      . ALBUTEROL SULFATE HFA 108 (90 BASE) MCG/ACT IN AERS Inhalation Inhale 2 puffs into the lungs every 4 (four) hours as  needed. For shortness of breath    . ASPIRIN 81 MG PO CHEW Oral Chew 81 mg by mouth daily.      Marland Kitchen CALCIUM CARBONATE-VITAMIN D 500-200 MG-UNIT PO TABS Oral Take 1 tablet by mouth 2 (two) times daily.      Marland Kitchen VITAMIN D 1000 UNITS PO TABS Oral Take 1,000 Units by mouth daily.      Marland Kitchen CLOPIDOGREL BISULFATE 75 MG PO TABS Oral Take 75 mg by mouth daily.      Marland Kitchen ESTROGENS, CONJUGATED 0.625 MG/GM VA CREA Vaginal Place vaginally daily.     . CYANOCOBALAMIN IJ Injection Inject 1,000 mg as directed every 30 (thirty) days.     . DOCUSATE SODIUM 100 MG PO CAPS Oral Take 100 mg by mouth 2 (two) times daily.    Marland Kitchen FERROUS SULFATE 325 (65 FE) MG PO TABS Oral Take 325 mg by mouth daily.     . OMEGA-3 FATTY ACIDS 1000 MG PO CAPS Oral Take 1 g by mouth daily.    Marland Kitchen FOLIC ACID 1 MG PO TABS Oral Take 1 mg by mouth daily.      . FUROSEMIDE 20 MG PO TABS Oral Take 1 tablet (20 mg total) by mouth every morning. 30  tablet 11  . ISOSORBIDE MONONITRATE ER 30 MG PO TB24 Oral Take 30 mg by mouth daily.      Marland Kitchen LANSOPRAZOLE 30 MG PO CPDR Oral Take 30 mg by mouth daily.    Marland Kitchen LEVOTHYROXINE SODIUM 75 MCG PO TABS Oral Take 1 tablet (75 mcg total) by mouth daily before breakfast.    . LORAZEPAM 0.5 MG PO TABS Oral Take 0.5 mg by mouth 2 (two) times daily.    Marland Kitchen MECLIZINE HCL 25 MG PO TABS Oral Take 25 mg by mouth every 6 (six) hours as needed. For vertigo.    Marland Kitchen METOPROLOL SUCCINATE ER 50 MG PO TB24 Oral Take 50 mg by mouth daily.      Marland Kitchen MIRTAZAPINE 15 MG PO TABS Oral Take 7.5 mg by mouth at bedtime.     Marland Kitchen NITROGLYCERIN 0.4 MG SL SUBL Sublingual Place 0.4 mg under the tongue every 5 (five) minutes x 3 doses as needed. As needed for chest pain.    . OXYCODONE-ACETAMINOPHEN 5-325 MG PO TABS Oral Take 1 tablet by mouth every 4 (four) hours as needed. pain    . POLYETHYLENE GLYCOL 3350 PO POWD Oral Take 17 g by mouth daily. For constipation    . POLYVINYL ALCOHOL 1.4 % OP SOLN Both Eyes Place 1 drop into both eyes 4 (four) times daily. For dry eyes     . POTASSIUM CHLORIDE CRYS ER 20 MEQ PO TBCR Oral Take 20 mEq by mouth daily.      Marland Kitchen TAMSULOSIN HCL 0.4 MG PO CAPS Oral Take 1 capsule (0.4 mg total) by mouth daily after breakfast. 30 capsule 0  . TRAMADOL HCL 50 MG PO TABS Oral Take 50 mg by mouth 3 (three) times daily.    . OXYCODONE-ACETAMINOPHEN 5-325 MG PO TABS Oral Take 1 tablet by mouth every 4 (four) hours as needed for pain. 20 tablet 0    BP 139/70  Pulse 73  Temp(Src) 97.6 F (36.4 C) (Oral)  Resp 18  SpO2 96%  Physical Exam  Nursing note and vitals reviewed. Constitutional: She is oriented to person, place, and time. She appears well-developed and well-nourished. No distress.  HENT:  Head: Normocephalic.       1 cm scalp  laceration to her posterior scalp.  No active bleeding.  Eyes: EOM are normal.  Neck:       Paracervical and cervical spinal tenderness.  Cardiovascular: Normal rate, regular rhythm  and normal heart sounds.   Pulmonary/Chest: Effort normal and breath sounds normal.  Abdominal: Soft. She exhibits no distension. There is no tenderness.  Musculoskeletal: Normal range of motion.  Neurological: She is alert and oriented to person, place, and time.  Skin: Skin is warm and dry.  Psychiatric: She has a normal mood and affect. Judgment normal.    ED Course  Procedures (including critical care time)  LACERATION REPAIR Performed by: Lyanne Co Consent: Verbal consent obtained. Risks and benefits: risks, benefits and alternatives were discussed Patient identity confirmed: provided demographic data Time out performed prior to procedure Prepped and Draped in normal sterile fashion Wound explored Laceration Location: Posterior scalp Laceration Length: 1 cm No Foreign Bodies seen or palpated Irrigation method: syringe Amount of cleaning: standard Skin closure: Staples  Number of sutures or staples: 2  Technique: Stapling  Patient tolerance: Patient tolerated the procedure well with no immediate complications.   Labs Reviewed - No data to display Ct Head Wo Contrast  12/26/2011  *RADIOLOGY REPORT*  Clinical Data:  Status post fall, with head laceration and altered mental status.  Concern for cervical spine injury.  CT HEAD WITHOUT CONTRAST AND CT CERVICAL SPINE WITHOUT CONTRAST  Technique:  Multidetector CT imaging of the head and cervical spine was performed following the standard protocol without intravenous contrast.  Multiplanar CT image reconstructions of the cervical spine were also generated.  Comparison: CT of the head performed 04/19/2010  CT HEAD  Findings: There is no evidence of acute infarction, mass lesion, or intra- or extra-axial hemorrhage on CT.  Prominence of the ventricles and sulci reflects moderate cortical volume loss.  Cerebellar atrophy is noted.  Diffuse periventricular and subcortical white matter change likely reflects small vessel ischemic  microangiopathy.  The brainstem and fourth ventricle are within normal limits.  The basal ganglia are unremarkable in appearance.  The cerebral hemispheres demonstrate grossly normal gray-white differentiation. No mass effect or midline shift is seen.  There is no evidence of fracture; visualized osseous structures are unremarkable in appearance.  A focal linear lucency along the right occiput appears stable from 2011; asymmetry reflects fusion of the suture on the left side.  The visualized portions of the orbits are within normal limits.  The paranasal sinuses and mastoid air cells are well-aerated.  No significant soft tissue abnormalities are seen.  IMPRESSION:  1.  No evidence of traumatic intracranial injury or fracture. 2.  Moderate cortical volume loss and diffuse small vessel ischemic microangiopathy.  CT CERVICAL SPINE  Findings: There is no evidence of acute fracture or subluxation. Minimal anterolisthesis is noted of C7 on T1.  There is chronic osseous fusion of vertebral bodies C5-C7.  Small anterior and posterior osteophyte complexes are noted along the upper cervical spine.  Prevertebral soft tissues are within normal limits.  There is diffuse enlargement of the thyroid gland, with rightward displacement of the trachea and esophagus by left-sided thyroid masses.  The appearance is most compatible with goiter.  The visualized lung apices are clear.  Dense calcification is noted at the carotid bifurcations bilaterally.  IMPRESSION:  1.  No evidence of acute fracture or subluxation along the cervical spine. 2.  Chronic osseous fusion at C5-C7. 3.  Degenerative change noted along the cervical spine. 4.  Diffuse enlargement of the  thyroid gland, with rightward displacement of the trachea and esophagus by left-sided thyroid masses.  This appearance is most compatible with goiter. 5.  Dense calcification at the carotid bifurcations bilaterally; this could be further assessed with elective carotid ultrasound,  when and as deemed clinically appropriate.  Original Report Authenticated By: Tonia Ghent, M.D.   Ct Cervical Spine Wo Contrast  12/26/2011  *RADIOLOGY REPORT*  Clinical Data:  Status post fall, with head laceration and altered mental status.  Concern for cervical spine injury.  CT HEAD WITHOUT CONTRAST AND CT CERVICAL SPINE WITHOUT CONTRAST  Technique:  Multidetector CT imaging of the head and cervical spine was performed following the standard protocol without intravenous contrast.  Multiplanar CT image reconstructions of the cervical spine were also generated.  Comparison: CT of the head performed 04/19/2010  CT HEAD  Findings: There is no evidence of acute infarction, mass lesion, or intra- or extra-axial hemorrhage on CT.  Prominence of the ventricles and sulci reflects moderate cortical volume loss.  Cerebellar atrophy is noted.  Diffuse periventricular and subcortical white matter change likely reflects small vessel ischemic microangiopathy.  The brainstem and fourth ventricle are within normal limits.  The basal ganglia are unremarkable in appearance.  The cerebral hemispheres demonstrate grossly normal gray-white differentiation. No mass effect or midline shift is seen.  There is no evidence of fracture; visualized osseous structures are unremarkable in appearance.  A focal linear lucency along the right occiput appears stable from 2011; asymmetry reflects fusion of the suture on the left side.  The visualized portions of the orbits are within normal limits.  The paranasal sinuses and mastoid air cells are well-aerated.  No significant soft tissue abnormalities are seen.  IMPRESSION:  1.  No evidence of traumatic intracranial injury or fracture. 2.  Moderate cortical volume loss and diffuse small vessel ischemic microangiopathy.  CT CERVICAL SPINE  Findings: There is no evidence of acute fracture or subluxation. Minimal anterolisthesis is noted of C7 on T1.  There is chronic osseous fusion of vertebral  bodies C5-C7.  Small anterior and posterior osteophyte complexes are noted along the upper cervical spine.  Prevertebral soft tissues are within normal limits.  There is diffuse enlargement of the thyroid gland, with rightward displacement of the trachea and esophagus by left-sided thyroid masses.  The appearance is most compatible with goiter.  The visualized lung apices are clear.  Dense calcification is noted at the carotid bifurcations bilaterally.  IMPRESSION:  1.  No evidence of acute fracture or subluxation along the cervical spine. 2.  Chronic osseous fusion at C5-C7. 3.  Degenerative change noted along the cervical spine. 4.  Diffuse enlargement of the thyroid gland, with rightward displacement of the trachea and esophagus by left-sided thyroid masses.  This appearance is most compatible with goiter. 5.  Dense calcification at the carotid bifurcations bilaterally; this could be further assessed with elective carotid ultrasound, when and as deemed clinically appropriate.  Original Report Authenticated By: Tonia Ghent, M.D.   I personally reviewed the images  1. Head injury   2. Scalp laceration       MDM  CT head C-spine are normal.  Laceration repaired.        Lyanne Co, MD 12/26/11 [phone]

## 2011-12-26 NOTE — ED Notes (Signed)
Report called to [facility]

## 2012-03-23 IMAGING — CT CT HEAD W/O CM
2 of 4 series · 11 of 30 positions shown, 14 images · non-contrast
Comparison: CT of the head performed 04/19/2010

CT HEAD

CLINICAL DATA: Status post fall, with head laceration and altered
mental status.  Concern for cervical spine injury.

CT HEAD WITHOUT CONTRAST AND CT CERVICAL SPINE WITHOUT CONTRAST
TECHNIQUE: Multidetector CT imaging of the head and cervical spine
was performed following the standard protocol without intravenous
contrast.  Multiplanar CT image reconstructions of the cervical
spine were also generated.

[Series 7: bone windows · axial · 0.45mm/px · z∈[+1553,+1580]mm · 2 of 45 slices shown]
[im 9/45  bone]
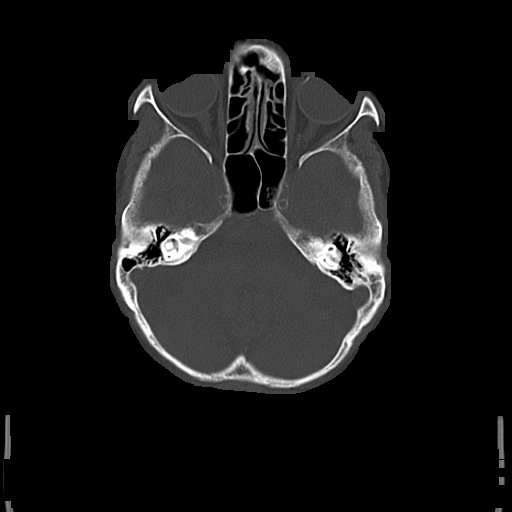
[im 18/45  bone]
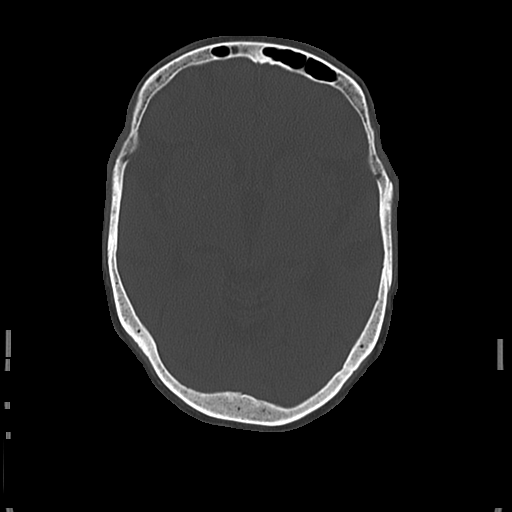

[Series 8: axial reformats · axial · 0.23mm/px · z∈[+1354,+1488]mm · 9 of 89 slices shown, 12 images]
[im 9/89  brain]
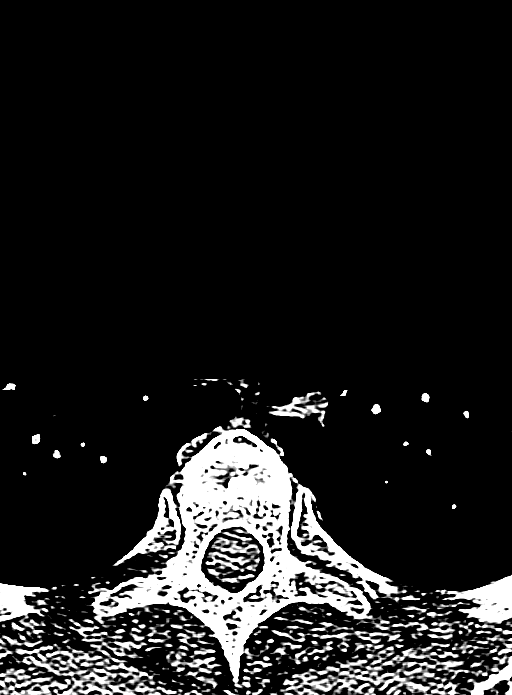
[im 9/89  bone]
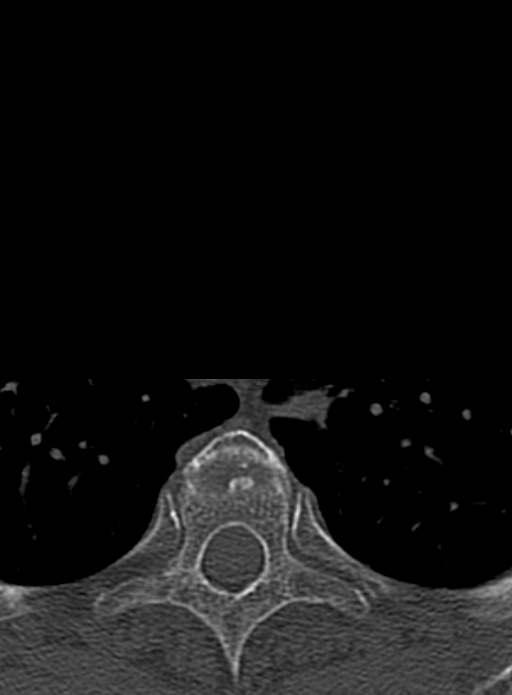
[im 18/89  brain]
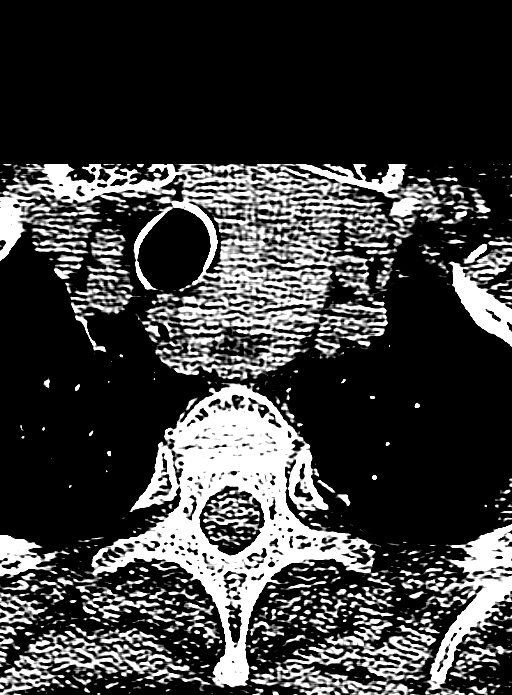
[im 27/89  brain]
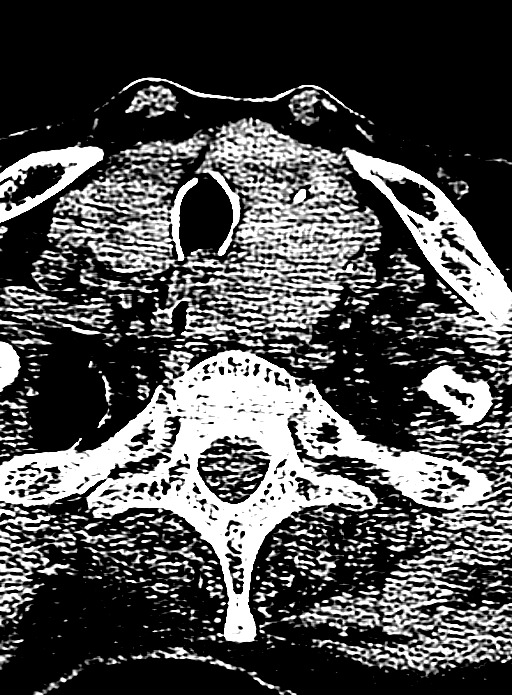
[im 36/89  brain]
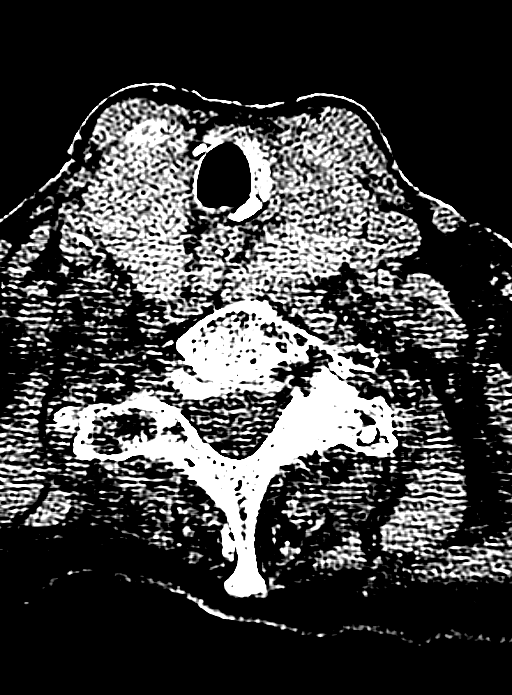
[im 45/89  brain]
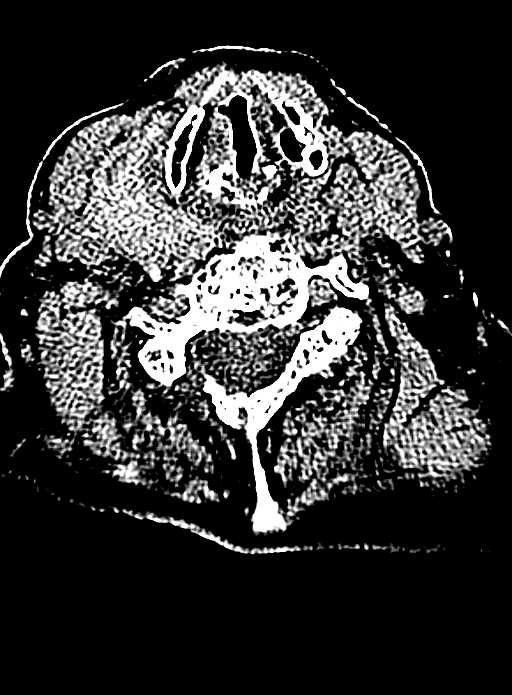
[im 45/89  bone]
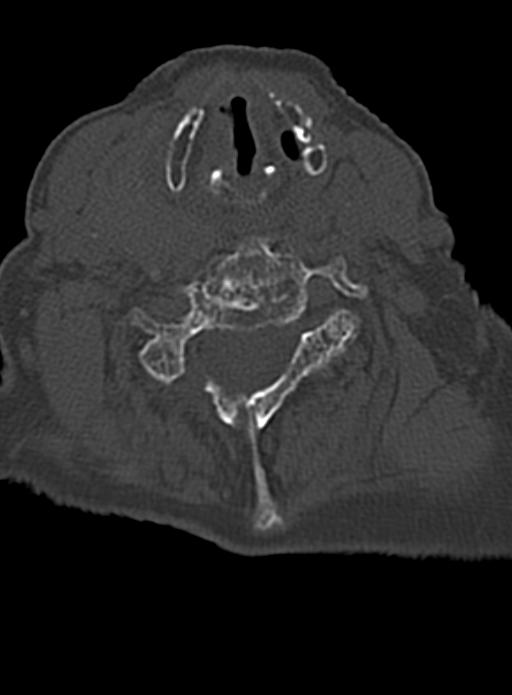
[im 53/89  brain]
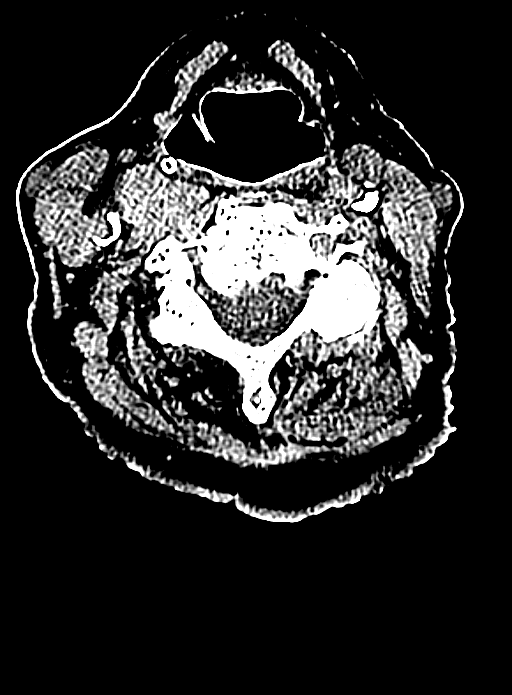
[im 62/89  brain]
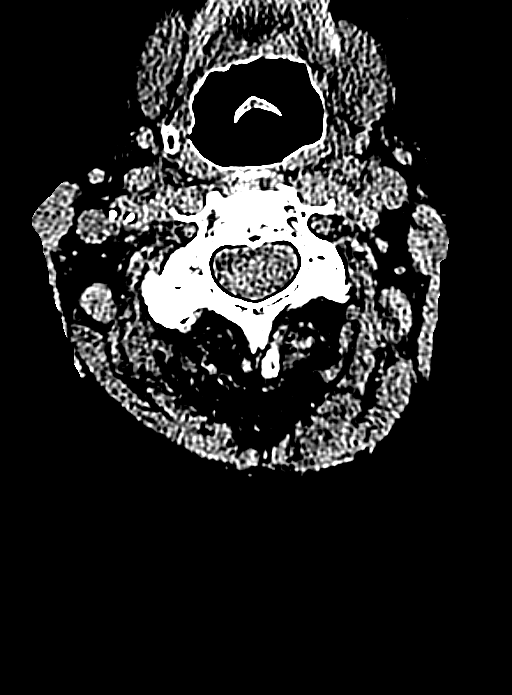
[im 71/89  brain]
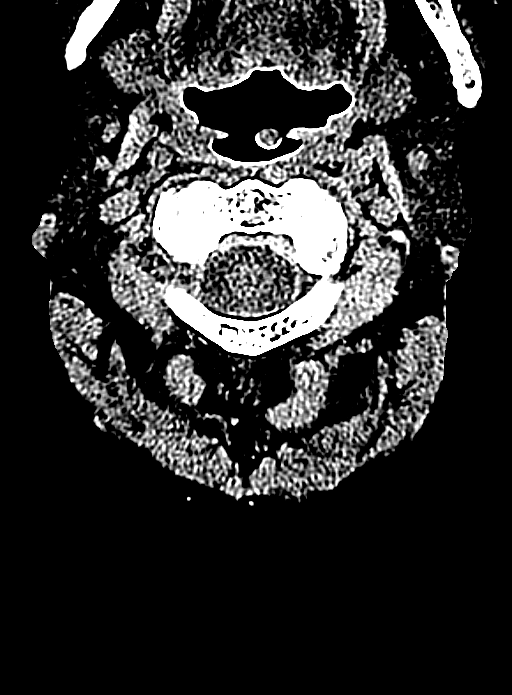
[im 80/89  brain]
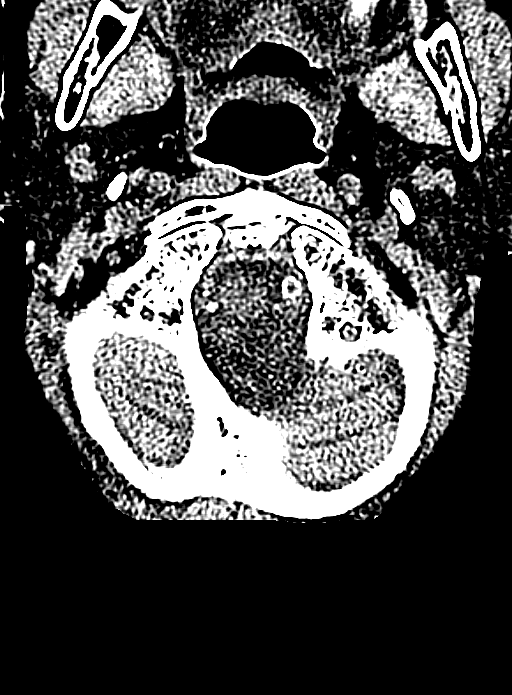
[im 80/89  bone]
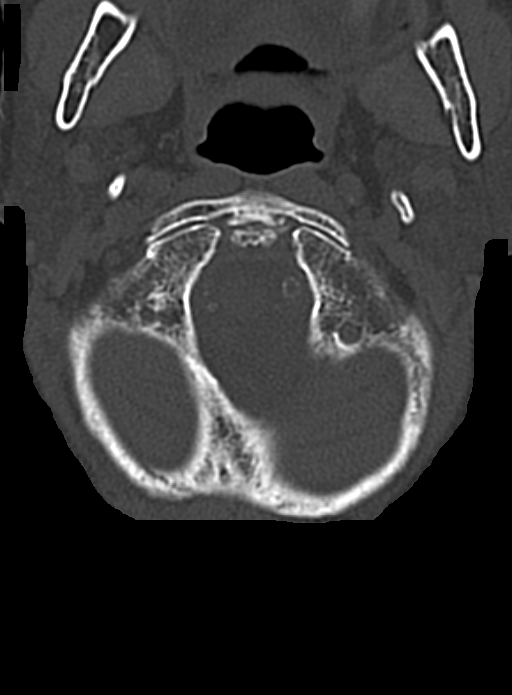

[11 of 30 positions shown; findings below may reference images not displayed]

FINDINGS: There is no evidence of acute infarction, mass lesion, or
intra- or extra-axial hemorrhage on CT.

Prominence of the ventricles and sulci reflects moderate cortical
volume loss.  Cerebellar atrophy is noted.  Diffuse periventricular
and subcortical white matter change likely reflects small vessel
ischemic microangiopathy.

The brainstem and fourth ventricle are within normal limits.  The
basal ganglia are unremarkable in appearance.  The cerebral
hemispheres demonstrate grossly normal gray-white differentiation.
No mass effect or midline shift is seen.

There is no evidence of fracture; visualized osseous structures are
unremarkable in appearance.  A focal linear lucency along the right
occiput appears stable from 8000; asymmetry reflects fusion of the
suture on the left side.  The visualized portions of the orbits are
within normal limits.  The paranasal sinuses and mastoid air cells
are well-aerated.  No significant soft tissue abnormalities are
seen.
IMPRESSION: 1.  No evidence of traumatic intracranial injury or fracture.
2.  Moderate cortical volume loss and diffuse small vessel ischemic
microangiopathy.

CT CERVICAL SPINE
FINDINGS: There is no evidence of acute fracture or subluxation.
Minimal anterolisthesis is noted of C7 on T1.  There is chronic
osseous fusion of vertebral bodies C5-C7.  Small anterior and
posterior osteophyte complexes are noted along the upper cervical
spine.  Prevertebral soft tissues are within normal limits.

There is diffuse enlargement of the thyroid gland, with rightward
displacement of the trachea and esophagus by left-sided thyroid
masses.  The appearance is most compatible with goiter.  The
visualized lung apices are clear.  Dense calcification is noted at
the carotid bifurcations bilaterally.
IMPRESSION: 1.  No evidence of acute fracture or subluxation along the cervical
spine.
2.  Chronic osseous fusion at C5-C7.
3.  Degenerative change noted along the cervical spine.
4.  Diffuse enlargement of the thyroid gland, with rightward
displacement of the trachea and esophagus by left-sided thyroid
masses.  This appearance is most compatible with goiter.
5.  Dense calcification at the carotid bifurcations bilaterally;
this could be further assessed with elective carotid ultrasound,
when and as deemed clinically appropriate.

## 2012-12-01 HISTORY — PX: TRANSTHORACIC ECHOCARDIOGRAM: SHX275

## 2012-12-12 ENCOUNTER — Encounter (HOSPITAL_COMMUNITY): Payer: Self-pay | Admitting: *Deleted

## 2012-12-12 ENCOUNTER — Emergency Department (HOSPITAL_COMMUNITY): Payer: Medicare Other

## 2012-12-12 ENCOUNTER — Inpatient Hospital Stay (HOSPITAL_COMMUNITY)
Admission: EM | Admit: 2012-12-12 | Discharge: 2012-12-18 | DRG: 280 | Disposition: A | Discharge status: Skilled Nursing Facility | Payer: Medicare Other | Attending: Cardiology | Admitting: Cardiology

## 2012-12-12 DIAGNOSIS — D649 Anemia, unspecified: Secondary | ICD-10-CM | POA: Diagnosis present

## 2012-12-12 DIAGNOSIS — J4489 Other specified chronic obstructive pulmonary disease: Secondary | ICD-10-CM | POA: Diagnosis present

## 2012-12-12 DIAGNOSIS — F411 Generalized anxiety disorder: Secondary | ICD-10-CM | POA: Diagnosis present

## 2012-12-12 DIAGNOSIS — I214 Non-ST elevation (NSTEMI) myocardial infarction: Secondary | ICD-10-CM | POA: Diagnosis present

## 2012-12-12 DIAGNOSIS — G20 Parkinson's disease: Secondary | ICD-10-CM | POA: Diagnosis present

## 2012-12-12 DIAGNOSIS — E039 Hypothyroidism, unspecified: Secondary | ICD-10-CM | POA: Diagnosis present

## 2012-12-12 DIAGNOSIS — Z91199 Patient's noncompliance with other medical treatment and regimen due to unspecified reason: Secondary | ICD-10-CM

## 2012-12-12 DIAGNOSIS — J449 Chronic obstructive pulmonary disease, unspecified: Secondary | ICD-10-CM | POA: Diagnosis present

## 2012-12-12 DIAGNOSIS — I4891 Unspecified atrial fibrillation: Secondary | ICD-10-CM | POA: Diagnosis present

## 2012-12-12 DIAGNOSIS — J810 Acute pulmonary edema: Secondary | ICD-10-CM

## 2012-12-12 DIAGNOSIS — Z7902 Long term (current) use of antithrombotics/antiplatelets: Secondary | ICD-10-CM

## 2012-12-12 DIAGNOSIS — J9600 Acute respiratory failure, unspecified whether with hypoxia or hypercapnia: Secondary | ICD-10-CM | POA: Diagnosis present

## 2012-12-12 DIAGNOSIS — I129 Hypertensive chronic kidney disease with stage 1 through stage 4 chronic kidney disease, or unspecified chronic kidney disease: Secondary | ICD-10-CM | POA: Diagnosis present

## 2012-12-12 DIAGNOSIS — Z9861 Coronary angioplasty status: Secondary | ICD-10-CM

## 2012-12-12 DIAGNOSIS — I2589 Other forms of chronic ischemic heart disease: Secondary | ICD-10-CM | POA: Diagnosis present

## 2012-12-12 DIAGNOSIS — Z951 Presence of aortocoronary bypass graft: Secondary | ICD-10-CM

## 2012-12-12 DIAGNOSIS — Z7901 Long term (current) use of anticoagulants: Secondary | ICD-10-CM

## 2012-12-12 DIAGNOSIS — Z79899 Other long term (current) drug therapy: Secondary | ICD-10-CM

## 2012-12-12 DIAGNOSIS — I208 Other forms of angina pectoris: Secondary | ICD-10-CM | POA: Diagnosis present

## 2012-12-12 DIAGNOSIS — I5033 Acute on chronic diastolic (congestive) heart failure: Secondary | ICD-10-CM

## 2012-12-12 DIAGNOSIS — J9690 Respiratory failure, unspecified, unspecified whether with hypoxia or hypercapnia: Secondary | ICD-10-CM

## 2012-12-12 DIAGNOSIS — M129 Arthropathy, unspecified: Secondary | ICD-10-CM | POA: Diagnosis present

## 2012-12-12 DIAGNOSIS — Z7982 Long term (current) use of aspirin: Secondary | ICD-10-CM

## 2012-12-12 DIAGNOSIS — I5043 Acute on chronic combined systolic (congestive) and diastolic (congestive) heart failure: Principal | ICD-10-CM | POA: Diagnosis present

## 2012-12-12 DIAGNOSIS — K219 Gastro-esophageal reflux disease without esophagitis: Secondary | ICD-10-CM | POA: Diagnosis present

## 2012-12-12 DIAGNOSIS — G20A1 Parkinson's disease without dyskinesia, without mention of fluctuations: Secondary | ICD-10-CM | POA: Diagnosis present

## 2012-12-12 DIAGNOSIS — Z86718 Personal history of other venous thrombosis and embolism: Secondary | ICD-10-CM

## 2012-12-12 DIAGNOSIS — Z8679 Personal history of other diseases of the circulatory system: Secondary | ICD-10-CM

## 2012-12-12 DIAGNOSIS — I509 Heart failure, unspecified: Secondary | ICD-10-CM | POA: Diagnosis present

## 2012-12-12 DIAGNOSIS — I10 Essential (primary) hypertension: Secondary | ICD-10-CM | POA: Diagnosis present

## 2012-12-12 DIAGNOSIS — Z9119 Patient's noncompliance with other medical treatment and regimen: Secondary | ICD-10-CM

## 2012-12-12 DIAGNOSIS — F329 Major depressive disorder, single episode, unspecified: Secondary | ICD-10-CM | POA: Diagnosis present

## 2012-12-12 DIAGNOSIS — Z87891 Personal history of nicotine dependence: Secondary | ICD-10-CM

## 2012-12-12 DIAGNOSIS — F3289 Other specified depressive episodes: Secondary | ICD-10-CM | POA: Diagnosis present

## 2012-12-12 DIAGNOSIS — E785 Hyperlipidemia, unspecified: Secondary | ICD-10-CM | POA: Diagnosis present

## 2012-12-12 DIAGNOSIS — I5042 Chronic combined systolic (congestive) and diastolic (congestive) heart failure: Secondary | ICD-10-CM | POA: Diagnosis present

## 2012-12-12 DIAGNOSIS — I2510 Atherosclerotic heart disease of native coronary artery without angina pectoris: Secondary | ICD-10-CM | POA: Diagnosis present

## 2012-12-12 DIAGNOSIS — N183 Chronic kidney disease, stage 3 unspecified: Secondary | ICD-10-CM | POA: Diagnosis present

## 2012-12-12 DIAGNOSIS — I255 Ischemic cardiomyopathy: Secondary | ICD-10-CM | POA: Diagnosis present

## 2012-12-12 HISTORY — DX: Non-ST elevation (NSTEMI) myocardial infarction: I21.4

## 2012-12-12 LAB — POCT I-STAT TROPONIN I: Troponin i, poc: 0.13 ng/mL (ref 0.00–0.08)

## 2012-12-12 LAB — CBC WITH DIFFERENTIAL/PLATELET
HCT: 41.3 % (ref 36.0–46.0)
Hemoglobin: 13 g/dL (ref 12.0–15.0)
Lymphocytes Relative: 19 % (ref 12–46)
Lymphs Abs: 2.7 10*3/uL (ref 0.7–4.0)
Monocytes Absolute: 0.6 10*3/uL (ref 0.1–1.0)
Monocytes Relative: 4 % (ref 3–12)
Neutro Abs: 10.8 10*3/uL — ABNORMAL HIGH (ref 1.7–7.7)
Neutrophils Relative %: 74 % (ref 43–77)
RBC: 4.18 MIL/uL (ref 3.87–5.11)

## 2012-12-12 LAB — POCT I-STAT, CHEM 8
BUN: 27 mg/dL — ABNORMAL HIGH (ref 6–23)
Creatinine, Ser: 1.2 mg/dL — ABNORMAL HIGH (ref 0.50–1.10)
Glucose, Bld: 255 mg/dL — ABNORMAL HIGH (ref 70–99)
Sodium: 137 mEq/L (ref 135–145)
TCO2: 27 mmol/L (ref 0–100)

## 2012-12-12 LAB — URINALYSIS, ROUTINE W REFLEX MICROSCOPIC
Bilirubin Urine: NEGATIVE
Glucose, UA: NEGATIVE mg/dL
Ketones, ur: NEGATIVE mg/dL
Leukocytes, UA: NEGATIVE
Nitrite: NEGATIVE
Protein, ur: NEGATIVE mg/dL

## 2012-12-12 LAB — COMPREHENSIVE METABOLIC PANEL
Albumin: 3.9 g/dL (ref 3.5–5.2)
Alkaline Phosphatase: 91 U/L (ref 39–117)
BUN: 26 mg/dL — ABNORMAL HIGH (ref 6–23)
CO2: 28 mEq/L (ref 19–32)
Chloride: 99 mEq/L (ref 96–112)
GFR calc Af Amer: 46 mL/min — ABNORMAL LOW (ref >90)
GFR calc non Af Amer: 40 mL/min — ABNORMAL LOW (ref >90)
Glucose, Bld: 243 mg/dL — ABNORMAL HIGH (ref 70–99)
Potassium: 4.8 mEq/L (ref 3.5–5.1)
Total Bilirubin: 0.3 mg/dL (ref 0.3–1.2)

## 2012-12-12 LAB — POCT I-STAT 3, ART BLOOD GAS (G3+)
Bicarbonate: 26.3 mEq/L — ABNORMAL HIGH (ref 20.0–24.0)
O2 Saturation: 99 %
TCO2: 28 mmol/L (ref 0–100)
pH, Arterial: 7.21 — ABNORMAL LOW (ref 7.350–7.450)

## 2012-12-12 LAB — PROTIME-INR: Prothrombin Time: 13.6 seconds (ref 11.6–15.2)

## 2012-12-12 MED ORDER — FUROSEMIDE 10 MG/ML IJ SOLN
INTRAMUSCULAR | Status: AC
  Filled 2012-12-12: qty 2

## 2012-12-12 MED ORDER — FUROSEMIDE 10 MG/ML IJ SOLN
INTRAMUSCULAR | Status: AC
  Filled 2012-12-12: qty 4

## 2012-12-12 MED ORDER — LORAZEPAM 2 MG/ML IJ SOLN
0.5000 mg | Freq: Four times a day (QID) | INTRAMUSCULAR | Status: DC | PRN
  Administered 2012-12-16: 0.5 mg via INTRAVENOUS
  Filled 2012-12-12: qty 1

## 2012-12-12 MED ORDER — WHITE PETROLATUM GEL
Status: AC
  Administered 2012-12-12: 23:00:00
  Filled 2012-12-12: qty 5

## 2012-12-12 MED ORDER — FUROSEMIDE 10 MG/ML IJ SOLN
60.0000 mg | Freq: Once | INTRAMUSCULAR | Status: AC
  Administered 2012-12-12: 60 mg via INTRAVENOUS

## 2012-12-12 MED ORDER — HEPARIN (PORCINE) IN NACL 100-0.45 UNIT/ML-% IJ SOLN
800.0000 [IU]/h | INTRAMUSCULAR | Status: DC
  Administered 2012-12-13: 800 [IU]/h via INTRAVENOUS
  Filled 2012-12-12 (×2): qty 250

## 2012-12-12 MED ORDER — NITROGLYCERIN IN D5W 200-5 MCG/ML-% IV SOLN
INTRAVENOUS | Status: AC
  Filled 2012-12-12: qty 250

## 2012-12-12 MED ORDER — HEPARIN BOLUS VIA INFUSION
3000.0000 [IU] | Freq: Once | INTRAVENOUS | Status: AC
  Administered 2012-12-12: 3000 [IU] via INTRAVENOUS
  Filled 2012-12-12: qty 3000

## 2012-12-12 MED ORDER — CLOPIDOGREL BISULFATE 75 MG PO TABS
75.0000 mg | ORAL_TABLET | Freq: Every morning | ORAL | Status: DC
  Administered 2012-12-13 – 2012-12-18 (×6): 75 mg via ORAL
  Filled 2012-12-12 (×6): qty 1

## 2012-12-12 MED ORDER — ONDANSETRON HCL 4 MG/2ML IJ SOLN: 4.0000 mg | Freq: Three times a day (TID) | INTRAMUSCULAR | Status: DC | PRN

## 2012-12-12 MED ORDER — FUROSEMIDE 10 MG/ML IJ SOLN
60.0000 mg | Freq: Once | INTRAMUSCULAR | Status: AC
  Administered 2012-12-12: 60 mg via INTRAVENOUS
  Filled 2012-12-12: qty 6

## 2012-12-12 MED ORDER — MORPHINE SULFATE 2 MG/ML IJ SOLN
2.0000 mg | INTRAMUSCULAR | Status: DC | PRN
  Administered 2012-12-12: 2 mg via INTRAVENOUS
  Administered 2012-12-13 (×2): 1 mg via INTRAVENOUS
  Administered 2012-12-14 (×2): 2 mg via INTRAVENOUS
  Filled 2012-12-12 (×6): qty 1

## 2012-12-12 MED ORDER — SODIUM CHLORIDE 0.9 % IJ SOLN
3.0000 mL | Freq: Two times a day (BID) | INTRAMUSCULAR | Status: DC
  Administered 2012-12-12 – 2012-12-17 (×10): 3 mL via INTRAVENOUS
  Administered 2012-12-17: 10:00:00 via INTRAVENOUS
  Administered 2012-12-18: 3 mL via INTRAVENOUS

## 2012-12-12 MED ORDER — NITROGLYCERIN IN D5W 200-5 MCG/ML-% IV SOLN
2.0000 ug/min | Freq: Once | INTRAVENOUS | Status: AC
  Administered 2012-12-12: 10 ug/min via INTRAVENOUS

## 2012-12-12 MED ORDER — ENOXAPARIN SODIUM 40 MG/0.4ML ~~LOC~~ SOLN
40.0000 mg | SUBCUTANEOUS | Status: DC
  Filled 2012-12-12: qty 0.4

## 2012-12-12 MED ORDER — ASPIRIN 81 MG PO CHEW
81.0000 mg | CHEWABLE_TABLET | Freq: Every morning | ORAL | Status: DC
  Administered 2012-12-13 – 2012-12-18 (×6): 81 mg via ORAL
  Filled 2012-12-12 (×6): qty 1

## 2012-12-12 MED ORDER — NITROGLYCERIN 0.4 MG SL SUBL
SUBLINGUAL_TABLET | SUBLINGUAL | Status: AC
  Filled 2012-12-12: qty 25

## 2012-12-12 MED ORDER — NITROGLYCERIN IN D5W 200-5 MCG/ML-% IV SOLN
2.0000 ug/min | INTRAVENOUS | Status: DC
  Administered 2012-12-12: 5 ug/min via INTRAVENOUS

## 2012-12-12 MED ORDER — FUROSEMIDE 10 MG/ML IJ SOLN
40.0000 mg | Freq: Four times a day (QID) | INTRAMUSCULAR | Status: DC
  Administered 2012-12-12 – 2012-12-13 (×3): 40 mg via INTRAVENOUS
  Filled 2012-12-12 (×3): qty 4

## 2012-12-12 MED ORDER — LEVOTHYROXINE SODIUM 100 MCG IV SOLR
25.0000 ug | Freq: Every day | INTRAVENOUS | Status: DC
  Administered 2012-12-13 – 2012-12-14 (×2): 26 ug via INTRAVENOUS
  Filled 2012-12-12 (×3): qty 1.3

## 2012-12-12 NOTE — ED Notes (Signed)
Pt is tolerating Bipap at the present.  She is still noted to have tachypnea but is starting to ask more coherent questions.  She is conversing with family at the bedside.

## 2012-12-12 NOTE — Progress Notes (Signed)
  Echocardiogram 2D Echocardiogram has been performed.  Jorje Guild 12/12/2012, 2:53 PM

## 2012-12-12 NOTE — Progress Notes (Signed)
ANTICOAGULATION CONSULT NOTE - Initial Consult  Pharmacy Consult for heparin Indication: chest pain/ACS  Allergies  Allergen Reactions  . Atorvastatin     REACTION: Palpitations  . Carbidopa W-Levodopa Diarrhea and Nausea Only     vivid dreams  . Codeine Nausea Only  . Duloxetine     REACTION: Constipation  . Gabapentin Nausea Only    REACTION: Pruritic rash,  lightheadedness  . Sulfamethoxazole W-Trimethoprim Nausea Only  . Trimethoprim Other (See Comments)    Per mar  . Venlafaxine     REACTION: orthostasis    Patient Measurements: Height: 5' 1.02" (155 cm) Weight: 123 lb 7.3 oz (56 kg) IBW/kg (Calculated) : 47.86 Heparin Dosing Weight: 55kg  Vital Signs: Temp: 98.6 F (37 C) (02/12 1200) Temp src: Core (Comment) (02/12 1200) BP: 170/76 mmHg (02/12 1246) Pulse Rate: 98 (02/12 1246)  Labs:  Recent Labs  12/12/12 0925 12/12/12 0930 12/12/12 1313  HGB 13.6 13.0  --   HCT 40.0 41.3  --   PLT  --  253  --   LABPROT  --  13.6  --   INR  --  1.05  --   CREATININE 1.20* 1.15*  --   TROPONINI  --  <0.30 0.35*    Estimated Creatinine Clearance: 23.1 ml/min (by C-G formula based on Cr of 1.15).   Medical History: Past Medical History  Diagnosis Date  . Congenital anisocoria   . Hallux valgus   . Lung nodule 1999    stable on followup CT  . DVT of lower extremity (deep venous thrombosis) 7/200o  . 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 Lumbar stenosis 2001  . S/P cardiac cath 08/2005    stents open  . 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 S/P endoscopy 12/07    mild gastritis  . Normal exercise sestamibi stress test 4/07  . Atrial fibrillation   . Anxiety   . COPD (chronic obstructive pulmonary disease)   . GERD (gastroesophageal reflux disease)   . Angina   . Shortness of breath   . Chronic kidney disease   . Coronary artery disease   . Hypertension   . CHF (congestive heart failure)   . Hypothyroidism   . Arthritis   . Pneumonia   .  Anemia   . Neuromuscular disorder   . Depression   . NSTEMI (non-ST elevated myocardial infarction) 12/12/2012    Assessment: 79 yo female with a history of CAD with PCI to the RCA and LAD in 2007. Cardiac enzymes now slightly positive, orders to start IV heparin. Previous sq lovenox ordered was never given. Will be very cautious with heparin dosing given age/wt. Baseline cbc/coags within normal limits.  Goal of Therapy:  Heparin level 0.3-0.7 units/ml Monitor platelets by anticoagulation protocol: Yes   Plan:  Give 3000 units bolus x 1 Start heparin infusion at 650 units/hr Check anti-Xa level in 8 hours and daily while on heparin Continue to monitor H&H and platelets  Severiano Gilbert 12/12/2012,3:36 PM

## 2012-12-12 NOTE — ED Notes (Signed)
Pt from [facility].  EMS states that she had an acute onset of SOB this morning.  Denies cough.  Pt noted to have crackles throughout bilaterally. On EMS arrival, pt was on 4L NRB of concentrated air at the NH sats 68-72%.  Pt was transferred over to a NRB 15L O2 and pt became more coherent.  On arrival to the ED, pt was diaphoretic, denies pain, A/O x 4, 80%.

## 2012-12-12 NOTE — ED Notes (Signed)
Results of troponin shown to Dr. Manus Gunning

## 2012-12-12 NOTE — Consult Note (Signed)
Reason for Consult:   Referring Physician:   [name] is an 79 y.o. female.  HPI:   The patient is a 79 yo female who lives at [facility] with a history of CAD with PCI to the RCA and LAD, moderate AV sclerosis, chronic diastolic HF.  She had a cardiac cath in 2007 because of a positive NST which revealed multivessel disease and two patent stents in the LAD and one in the prox ramus branch.  LVEF was normal.      The patient is currently on BiPap, alert but unable to respond to questions.  Her daughter-in-law stated that she began having a difficult time breathing with increasing LEE.  Past Medical History  Diagnosis Date  . Congenital anisocoria   . Hallux valgus   . Lung nodule 1999    stable on followup CT  . DVT of lower extremity (deep venous thrombosis) 7/200o  . 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 Lumbar stenosis 2001  . S/P cardiac cath 08/2005    stents open  . 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 S/P endoscopy 12/07    mild gastritis  . Normal exercise sestamibi stress test 4/07  . Atrial fibrillation   . Anxiety   . COPD (chronic obstructive pulmonary disease)   . GERD (gastroesophageal reflux disease)   . Angina   . Shortness of breath   . Chronic kidney disease   . Coronary artery disease   . Hypertension   . CHF (congestive heart failure)   . Hypothyroidism   . Arthritis   . Pneumonia   . Anemia   . Neuromuscular disorder   . Depression     Past Surgical History  Procedure Laterality Date  . Thyroidectomy, partial  1933  . Coronary angioplasty with stent placement  9/99  . Appendectomy    . Cervical discectomy    . Cholecystectomy    . Coronary angioplasty with stent placement  2000  . Rotator cuff repair    . Orif hip fracture  2001  . Splenectomy    . Femur im nail  10/09/2011    Procedure: INTRAMEDULLARY (IM) NAIL FEMORAL;  Surgeon: Kathryne Hitch;  Location: [facility] OR;  Service: Orthopedics;  Laterality: Left;     History reviewed. No pertinent family history.  Social History:  reports that she quit smoking about 41 years ago. Her smokeless tobacco use includes Chew. Her alcohol and drug histories are not on file.  Allergies:  Allergies  Allergen Reactions  . Atorvastatin     REACTION: Palpitations  . Carbidopa W-Levodopa Diarrhea and Nausea Only     vivid dreams  . Codeine Nausea Only  . Duloxetine     REACTION: Constipation  . Gabapentin Nausea Only    REACTION: Pruritic rash,  lightheadedness  . Sulfamethoxazole W-Trimethoprim Nausea Only  . Trimethoprim Other (See Comments)    Per mar  . Venlafaxine     REACTION: orthostasis    Medications: . aspirin  81 mg Oral q morning - 10a  . clopidogrel  75 mg Oral q morning - 10a  . enoxaparin (LOVENOX) injection  40 mg Subcutaneous Q24H  . furosemide      . furosemide  40 mg Intravenous Q6H  . [START ON 12/13/2012] levothyroxine  26 mcg Intravenous Q breakfast  . nitroGLYCERIN      . sodium chloride  3 mL Intravenous Q12H     Results for orders placed during  the hospital encounter of 12/12/12 (from the past 48 hour(s))  POCT I-STAT 3, BLOOD GAS (G3+)     Status: Abnormal   Collection Time    12/12/12  9:22 AM      Result Value Range   pH, Arterial 7.210 (*) 7.350 - 7.450   pCO2 arterial 65.7 (*) 35.0 - 45.0 mmHg   pO2, Arterial 188.0 (*) 80.0 - 100.0 mmHg   Bicarbonate 26.3 (*) 20.0 - 24.0 mEq/L   TCO2 28  0 - 100 mmol/L   O2 Saturation 99.0     Acid-base deficit 3.0 (*) 0.0 - 2.0 mmol/L   Collection site RADIAL, ALLEN'S TEST ACCEPTABLE     Drawn by Operator     Sample type ARTERIAL     Comment NOTIFIED PHYSICIAN    POCT I-STAT TROPONIN I     Status: Abnormal   Collection Time    12/12/12  9:23 AM      Result Value Range   Troponin i, poc 0.13 (*) 0.00 - 0.08 ng/mL   Comment NOTIFIED PHYSICIAN     Comment 3            Comment: Due to the release kinetics of cTnI,     a negative result within the first hours     of  the onset of symptoms does not rule out     myocardial infarction with certainty.     If myocardial infarction is still suspected,     repeat the test at appropriate intervals.  POCT I-STAT, CHEM 8     Status: Abnormal   Collection Time    12/12/12  9:25 AM      Result Value Range   Sodium 137  135 - 145 mEq/L   Potassium 4.5  3.5 - 5.1 mEq/L   Chloride 105  96 - 112 mEq/L   BUN 27 (*) 6 - 23 mg/dL   Creatinine, Ser 7.82 (*) 0.50 - 1.10 mg/dL   Glucose, Bld 956 (*) 70 - 99 mg/dL   Calcium, Ion 2.13  0.86 - 1.30 mmol/L   TCO2 27  0 - 100 mmol/L   Hemoglobin 13.6  12.0 - 15.0 g/dL   HCT 57.8  46.9 - 62.9 %  CBC WITH DIFFERENTIAL     Status: Abnormal   Collection Time    12/12/12  9:30 AM      Result Value Range   WBC 14.7 (*) 4.0 - 10.5 K/uL   RBC 4.18  3.87 - 5.11 MIL/uL   Hemoglobin 13.0  12.0 - 15.0 g/dL   HCT 52.8  41.3 - 24.4 %   MCV 98.8  78.0 - 100.0 fL   MCH 31.1  26.0 - 34.0 pg   MCHC 31.5  30.0 - 36.0 g/dL   RDW 01.0  27.2 - 53.6 %   Platelets 253  150 - 400 K/uL   Neutrophils Relative 74  43 - 77 %   Neutro Abs 10.8 (*) 1.7 - 7.7 K/uL   Lymphocytes Relative 19  12 - 46 %   Lymphs Abs 2.7  0.7 - 4.0 K/uL   Monocytes Relative 4  3 - 12 %   Monocytes Absolute 0.6  0.1 - 1.0 K/uL   Eosinophils Relative 3  0 - 5 %   Eosinophils Absolute 0.5  0.0 - 0.7 K/uL   Basophils Relative 1  0 - 1 %   Basophils Absolute 0.1  0.0 - 0.1 K/uL  COMPREHENSIVE METABOLIC PANEL  Status: Abnormal   Collection Time    12/12/12  9:30 AM      Result Value Range   Sodium 139  135 - 145 mEq/L   Potassium 4.8  3.5 - 5.1 mEq/L   Chloride 99  96 - 112 mEq/L   CO2 28  19 - 32 mEq/L   Glucose, Bld 243 (*) 70 - 99 mg/dL   BUN 26 (*) 6 - 23 mg/dL   Creatinine, Ser 1.19 (*) 0.50 - 1.10 mg/dL   Calcium 9.8  8.4 - 14.7 mg/dL   Total Protein 7.8  6.0 - 8.3 g/dL   Albumin 3.9  3.5 - 5.2 g/dL   AST 18  0 - 37 U/L   ALT 7  0 - 35 U/L   Alkaline Phosphatase 91  39 - 117 U/L   Total Bilirubin  0.3  0.3 - 1.2 mg/dL   GFR calc non Af Amer 40 (*) >90 mL/min   GFR calc Af Amer 46 (*) >90 mL/min   Comment:            The eGFR has been calculated     using the CKD EPI equation.     This calculation has not been     validated in all clinical     situations.     eGFR's persistently     <90 mL/min signify     possible Chronic Kidney Disease.  PROTIME-INR     Status: None   Collection Time    12/12/12  9:30 AM      Result Value Range   Prothrombin Time 13.6  11.6 - 15.2 seconds   INR 1.05  0.00 - 1.49  TROPONIN I     Status: None   Collection Time    12/12/12  9:30 AM      Result Value Range   Troponin I <0.30  <0.30 ng/mL   Comment:            Due to the release kinetics of cTnI,     a negative result within the first hours     of the onset of symptoms does not rule out     myocardial infarction with certainty.     If myocardial infarction is still suspected,     repeat the test at appropriate intervals.  PRO B NATRIURETIC PEPTIDE     Status: Abnormal   Collection Time    12/12/12  9:30 AM      Result Value Range   Pro B Natriuretic peptide (BNP) 6291.0 (*) 0 - 450 pg/mL  URINALYSIS, ROUTINE W REFLEX MICROSCOPIC     Status: None   Collection Time    12/12/12 10:24 AM      Result Value Range   Color, Urine YELLOW  YELLOW   APPearance CLEAR  CLEAR   Specific Gravity, Urine 1.009  1.005 - 1.030   pH 6.0  5.0 - 8.0   Glucose, UA NEGATIVE  NEGATIVE mg/dL   Hgb urine dipstick NEGATIVE  NEGATIVE   Bilirubin Urine NEGATIVE  NEGATIVE   Ketones, ur NEGATIVE  NEGATIVE mg/dL   Protein, ur NEGATIVE  NEGATIVE mg/dL   Urobilinogen, UA 0.2  0.0 - 1.0 mg/dL   Nitrite NEGATIVE  NEGATIVE   Leukocytes, UA NEGATIVE  NEGATIVE   Comment: MICROSCOPIC NOT DONE ON URINES WITH NEGATIVE PROTEIN, BLOOD, LEUKOCYTES, NITRITE, OR GLUCOSE <1000 mg/dL.    Dg Chest Portable 1 View  12/12/2012  *RADIOLOGY REPORT*  Clinical Data: Short  of breath.  PORTABLE CHEST - 1 VIEW  Comparison: 10/08/2011   Findings: Mild cardiac enlargement.  There are bilateral pleural effusions identified, left greater than right.  Moderate interstitial edema with bilateral airspace consolidation.  IMPRESSION:  1.  CHF. 2.  Bilateral airspace opacities which may be due to areas of asymmetric edema or multifocal infection.   Original Report Authenticated By: Signa Kell, M.D.     Review of Systems  Unable to perform ROS: medical condition   Blood pressure 170/76, pulse 98, temperature 98.6 F (37 C), temperature source Core (Comment), resp. rate 26, SpO2 100.00%. Physical Exam  Constitutional: She appears well-developed and well-nourished. She appears distressed.  HENT:  Head: Normocephalic and atraumatic.  Eyes: EOM are normal. Pupils are equal, round, and reactive to light. No scleral icterus.  Neck: Normal range of motion.  Cardiovascular:  Pulses:      Radial pulses are 1+ on the right side, and 1+ on the left side.       Dorsalis pedis pulses are 1+ on the right side, and 1+ on the left side.  Could not auscultate heart sounds due to bipap noise.  GI: Soft. Bowel sounds are normal.  Musculoskeletal:  2+LEE   Neurological: She is alert.  Skin: Skin is warm and dry.    Assessment/Plan: 1. Acute respiratory failure.  Likely secondary to #2 2. Acute on chronic combined systolic and diastolic HF 3.  CAD. 4.  EF 35-40% by echo 12/12/12. New WMA 5. HTN 6.  CKD stage 3 7.  NSTEMI  Plan:  Acute  respiratory failure, elevated BNP(6291.0) with LEE in the setting of diastolic dysfunction.  Now on BiPap and getting IV lasix; 120mg  IV so far.  Bumped next dose of 40mg  to 1800hrs.  Urine output looks good.  2D Echo being completed now.  Last JYNW(2956) showed patent stents in the LAD and ramus.  Troponin negative and cycling.  IV heparin.   [name] 12/12/2012, 2:00 PM

## 2012-12-12 NOTE — ED Provider Notes (Signed)
History     CSN: [id number]  Arrival date & time 12/12/12  4098   First MD Initiated Contact with Patient 12/12/12 [phone]      Chief Complaint  Patient presents with  . Shortness of Breath    (Consider location/radiation/quality/duration/timing/severity/associated sxs/prior treatment) HPI Comments: Patient arrives via EMS with respiratory distress the subcutaneous with this morning. She was found to be hypoxic at her nursing home and the high 60s. She is placed on nonrebreather and her mental status improved. She is awake and complaining of difficulty breathing. She denies chest pain. She denies cough or fever. She is saturating the mid 80s another breathing. She is DO NOT RESUSCITATE.  The history is provided by the patient and the EMS personnel. The history is limited by the condition of the patient.    Past Medical History  Diagnosis Date  . Congenital anisocoria   . Hallux valgus   . Lung nodule 1999    stable on followup CT  . DVT of lower extremity (deep venous thrombosis) 7/200o  . 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 Lumbar stenosis 2001  . S/P cardiac cath 08/2005    stents open  . 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 S/P endoscopy 12/07    mild gastritis  . Normal exercise sestamibi stress test 4/07  . Atrial fibrillation   . Anxiety   . COPD (chronic obstructive pulmonary disease)   . GERD (gastroesophageal reflux disease)   . Angina   . Shortness of breath   . Chronic kidney disease   . Coronary artery disease   . Hypertension   . CHF (congestive heart failure)   . Hypothyroidism   . Arthritis   . Pneumonia   . Anemia   . Neuromuscular disorder   . Depression     Past Surgical History  Procedure Laterality Date  . Thyroidectomy, partial  1933  . Coronary angioplasty with stent placement  9/99  . Appendectomy    . Cervical discectomy    . Cholecystectomy    . Coronary angioplasty with stent placement  2000  . Rotator cuff repair    .  Orif hip fracture  2001  . Splenectomy    . Femur im nail  10/09/2011    Procedure: INTRAMEDULLARY (IM) NAIL FEMORAL;  Surgeon: Kathryne Hitch;  Location: [facility] OR;  Service: Orthopedics;  Laterality: Left;    History reviewed. No pertinent family history.  History  Substance Use Topics  . Smoking status: Former Smoker    Quit date: 10/08/1971  . Smokeless tobacco: Current User    Types: Chew  . Alcohol Use: Not on file    OB History   Grav Para Term Preterm Abortions TAB SAB Ect Mult Living                  Review of Systems  Unable to perform ROS: Severe respiratory distress  Respiratory: Positive for shortness of breath.     Allergies  Atorvastatin; Carbidopa w-levodopa; Codeine; Duloxetine; Gabapentin; Sulfamethoxazole w-trimethoprim; Trimethoprim; and Venlafaxine  Home Medications   Current Outpatient Rx  Name  Route  Sig  Dispense  Refill  . acetaminophen (TYLENOL) 325 MG tablet   Oral   Take 650 mg by mouth at bedtime.         Marland Kitchen albuterol (PROVENTIL HFA;VENTOLIN HFA) 108 (90 BASE) MCG/ACT inhaler   Inhalation   Inhale 1 puff into the lungs daily as needed. For wheezing.         Marland Kitchen  albuterol (PROVENTIL HFA;VENTOLIN HFA) 108 (90 BASE) MCG/ACT inhaler   Inhalation   Inhale 2 puffs into the lungs 3 (three) times daily.         Marland Kitchen aspirin (BABY ASPIRIN) 81 MG chewable tablet   Oral   Chew 81 mg by mouth every morning.          . Calcium Carbonate-Vitamin D (OYSCO 500+D PO)   Oral   Take 1 tablet by mouth 2 (two) times daily.         . clopidogrel (PLAVIX) 75 MG tablet   Oral   Take 75 mg by mouth every morning.          . diclofenac sodium (VOLTAREN) 1 % GEL   Topical   Apply 2 g topically 2 (two) times daily. To left shoulder         . docusate sodium (COLACE) 100 MG capsule   Oral   Take 100 mg by mouth 2 (two) times daily.         . ferrous sulfate 325 (65 FE) MG tablet   Oral   Take 325 mg by mouth every morning.           . fish oil-omega-3 fatty acids 1000 MG capsule   Oral   Take 1 g by mouth every morning.          . folic acid (FOLVITE) 400 MCG tablet   Oral   Take 800 mcg by mouth every morning.         . furosemide (LASIX) 20 MG tablet   Oral   Take 20 mg by mouth 2 (two) times daily.         . isosorbide mononitrate (IMDUR) 60 MG 24 hr tablet   Oral   Take 60 mg by mouth at bedtime.         . lansoprazole (PREVACID) 30 MG capsule   Oral   Take 30 mg by mouth every morning.          Marland Kitchen levothyroxine (LEVOTHROID) 75 MCG tablet   Oral   Take 1 tablet (75 mcg total) by mouth daily before breakfast.         . loratadine (CLARITIN) 10 MG tablet   Oral   Take 10 mg by mouth every morning.         Marland Kitchen LORazepam (ATIVAN) 0.5 MG tablet   Oral   Take 0.25 mg by mouth every morning.         Marland Kitchen LORazepam (ATIVAN) 0.5 MG tablet   Oral   Take 0.5 mg by mouth every evening.         . metoprolol succinate (TOPROL-XL) 25 MG 24 hr tablet   Oral   Take 75 mg by mouth every morning.         . mirtazapine (REMERON) 7.5 MG tablet   Oral   Take 7.5 mg by mouth at bedtime.         Marland Kitchen morphine (ROXANOL) 20 MG/ML concentrated solution   Oral   Take 5 mg by mouth every 2 (two) hours as needed for pain. For pain.         Marland Kitchen OVER THE COUNTER MEDICATION   Oral   Take 1,000 Units by mouth every morning. Vitamin D-3         . polyethylene glycol (MIRALAX) powder   Oral   Take 17 g by mouth every evening. For constipation         .  polyvinyl alcohol (LIQUIFILM TEARS) 1.4 % ophthalmic solution   Both Eyes   Place 1 drop into both eyes 4 (four) times daily. For dry eyes          . potassium chloride SA (K-DUR,KLOR-CON) 20 MEQ tablet   Oral   Take 20 mEq by mouth every evening.          . senna (SENOKOT) 8.6 MG TABS   Oral   Take 1 tablet by mouth every other day.         . sertraline (ZOLOFT) 25 MG tablet   Oral   Take 25 mg by mouth every morning.         .  Tamsulosin HCl (FLOMAX) 0.4 MG CAPS   Oral   Take 1 capsule (0.4 mg total) by mouth daily after breakfast.   30 capsule   0   . traMADol (ULTRAM) 50 MG tablet   Oral   Take 50 mg by mouth 3 (three) times daily.         . vitamin B-12 (CYANOCOBALAMIN) 1000 MCG tablet   Oral   Take 1,000 mcg by mouth every morning.         Marland Kitchen LORazepam (ATIVAN) 0.5 MG tablet   Oral   Take 0.5 mg by mouth every 6 (six) hours as needed. For anxiety         . nitroGLYCERIN (NITROSTAT) 0.4 MG SL tablet   Sublingual   Place 0.4 mg under the tongue every 5 (five) minutes x 3 doses as needed. For chest pain.         Marland Kitchen oxyCODONE-acetaminophen (PERCOCET) 5-325 MG per tablet   Oral   Take 1 tablet by mouth every 4 (four) hours as needed. pain           BP 103/61  Pulse 80  Resp 22  SpO2 97%  Physical Exam  Constitutional: She is oriented to person, place, and time. She appears distressed.  Anxious, severe respiratory distress, chest or muscle use abdominal breathing  HENT:  Head: Normocephalic and atraumatic.  Mouth/Throat: Oropharynx is clear and moist.  Eyes: Conjunctivae and EOM are normal. Pupils are equal, round, and reactive to light.  Neck: Normal range of motion. Neck supple.  Cardiovascular: Normal rate, regular rhythm and normal heart sounds.   No murmur heard. Pulmonary/Chest: She is in respiratory distress. She has rales.  Abdominal: Soft. There is no tenderness. There is no rebound and no guarding.  Musculoskeletal: Normal range of motion. She exhibits no edema and no tenderness.  Neurological: She is alert and oriented to person, place, and time. No cranial nerve deficit. She exhibits normal muscle tone. Coordination normal.  Skin: Skin is warm.    ED Course  Procedures (including critical care time)  Labs Reviewed  CBC WITH DIFFERENTIAL - Abnormal; Notable for the following:    WBC 14.7 (*)    Neutro Abs 10.8 (*)    All other components within normal limits   COMPREHENSIVE METABOLIC PANEL - Abnormal; Notable for the following:    Glucose, Bld 243 (*)    BUN 26 (*)    Creatinine, Ser 1.15 (*)    GFR calc non Af Amer 40 (*)    GFR calc Af Amer 46 (*)    All other components within normal limits  PRO B NATRIURETIC PEPTIDE - Abnormal; Notable for the following:    Pro B Natriuretic peptide (BNP) 6291.0 (*)    All other components within normal limits  POCT I-STAT 3, BLOOD GAS (G3+) - Abnormal; Notable for the following:    pH, Arterial 7.210 (*)    pCO2 arterial 65.7 (*)    pO2, Arterial 188.0 (*)    Bicarbonate 26.3 (*)    Acid-base deficit 3.0 (*)    All other components within normal limits  POCT I-STAT, CHEM 8 - Abnormal; Notable for the following:    BUN 27 (*)    Creatinine, Ser 1.20 (*)    Glucose, Bld 255 (*)    All other components within normal limits  POCT I-STAT TROPONIN I - Abnormal; Notable for the following:    Troponin i, poc 0.13 (*)    All other components within normal limits  PROTIME-INR  TROPONIN I  URINALYSIS, ROUTINE W REFLEX MICROSCOPIC   Dg Chest Portable 1 View  12/12/2012  *RADIOLOGY REPORT*  Clinical Data: Short of breath.  PORTABLE CHEST - 1 VIEW  Comparison: 10/08/2011  Findings: Mild cardiac enlargement.  There are bilateral pleural effusions identified, left greater than right.  Moderate interstitial edema with bilateral airspace consolidation.  IMPRESSION:  1.  CHF. 2.  Bilateral airspace opacities which may be due to areas of asymmetric edema or multifocal infection.   Original Report Authenticated By: Signa Kell, M.D.      1. Flash pulmonary edema   2. Respiratory failure       MDM  Severe respiratory distress that onset acutely. Diffuse crackles throughout with hypoxia on nonrebreather. Suspect flash pulmonary edema of unknown origin. Placed on BiPAP on arrival given Lasix and nitroglycerin.  Additional history obtained and family reports patient has a history of CHF and COPD. She's been on  oxygen intermittently over the past 2 weeks at her nursing home.  X-ray shows pulmonary edema. Patient has good diuresis 2 Lasix about 600 mL of urine. FiO2 was to be weaned. However patient still required peep of 14.  Daughter at bedside confirms DNR.  Patient improving with diuresis and bipap.  Admission to step down d/w Dr. Lavera Guise.   Date: 12/12/2012  Rate: 123  Rhythm: sinus tachycardia  QRS Axis: left  Intervals: normal  ST/T Wave abnormalities: nonspecific T wave changes  Conduction Disutrbances:none  Narrative Interpretation: Lateral T wave inversions  Old EKG Reviewed: changes noted  CRITICAL CARE Performed by: Glynn Octave   Total critical care time: 45  Critical care time was exclusive of separately billable procedures and treating other patients.  Critical care was necessary to treat or prevent imminent or life-threatening deterioration.  Critical care was time spent personally by me on the following activities: development of treatment plan with patient and/or surrogate as well as nursing, discussions with consultants, evaluation of patient's response to treatment, examination of patient, obtaining history from patient or surrogate, ordering and performing treatments and interventions, ordering and review of laboratory studies, ordering and review of radiographic studies, pulse oximetry and re-evaluation of patient's condition.   Glynn Octave, MD 12/12/12 1550

## 2012-12-12 NOTE — ED Notes (Signed)
Pt's resp. Rate has decreased from the high 40's to now 18-24.  Her minute volumes have increased from the 200-250 ml range to the lower 400's.  She is still tolerating the bipap without difficulty.  Resp symmetrical and unlabored.  Skin warm and dry.  Lung sounds are still rhonci.

## 2012-12-12 NOTE — Consult Note (Signed)
I have seen and evaluated the patient this PM along with Wilburt Finlay, PA. I agree with hi findings, examination as well as impression recommendations.  The patient is now relatively well known to me. She is a 79 year old woman with a history of coronary disease of PCI to the, moderate aortic sclerosis chronic diastolic heart failure and chronic stable angina.   PAST CARDIAC HISTORY: 1. Coronary Artery Disease: PCI to the RCA & proximal and mid LAD, as well as Ramus Intermedius.  Catheterization was August 2007: This showed widely patent stents in the RCA, LAD and ramus intermedius. Normal LV function.  PCI - RCA in October 2002. I do not have the full details of the type of stent  LAD treated with a 2.5-x-13-mm Cypher DES in May 2006 for kind of a gap lesion between the 2 previously placed stents.  Also proximal Ramus Intermedius stents.  Most recent echo from September 2009. No real regional wall motion abnormalities noted but poor images. Appeared to have some moderate sclerosis in the aortic valve but not overly helpful study.  Most recent Myoview was in April 2007 which showed probable inferolateral wall ischemia. This has led to her cath which did not show any stenosis.  On long-acting nitrate for chronic angina.   I actually saw her back in January of the 21st with acute on chronic diastolic heart failure with worsening episodes of angina. At that time we increased her diuresis and nitrates. She diuresis 11 pounds and felt much better in followup 2 weeks later. At that time we discussed the possibility of inpatient admission for IV diuresis but she and the family did not feel like this is the best plan. They prefer to stay as outpatient and minimize hospital visits. When I last saw her she was doing very well was off oxygen and doing relatively well. We decided then to not proceed with an echocardiogram based her significant improvement.  She indicated that she would not be  interested in any invasive evaluation catheterizations and therefore we decided not to go for stress test either.  I forcefully she presented this morning with sudden onset worsening dyspnea and hypoxia -- oxygen saturations in the 60s upon arrival to the ER. Talking to her daughter-in-law, who is the main attended family member, she was doing very well yesterday not even on oxygen. This would insinuate a likely acute change, however with a minimal troponin elevation, an acute coronary event is less likely. She is admitted for what looked white flash pulmonary edema with and was placed on BiPAP and given aggressive diuretics in the emergency room. By the time I was seeing her, she was getting echocardiogram done that now shows a new LAD wall motion abnormality the decreased ejection fraction of 35-40% and significantly elevated filling pressures.  Despite the acute change she only has minimal elevation in her troponin. My suspicion is that this is subacute presentation with progressive heart failure exacerbations following a recent LAD stenosis or occlusion.   Her BNP is elevated at 6000, it had been down to about 800.    Currently on exam she is much less dyspneic than on initial evaluation by Mr. Leron Croak. She does have some bilateral rales and decreased air movement.  She does have the pre-existing aortic sclerosis murmur as well as soft S4 gallop. She has pitting edema bilaterally but only slightly greater than was before having put out close to 1200 mL of urine.  Principal Problem:   Acute respiratory failure Active Problems:  Acute on chronic combined systolic and diastolic CHF, NYHA class 3   S/P CABG (coronary artery bypass graft)   Mild NSTEMI (non-ST elevated myocardial infarction) - current troponin elevation more likely due to CHF   Stable angina    My overall impression is that the recurrence of anginal symptoms the gotten worse over last month or so was probably due to worsening LAD  disease. Now is probably occluded based on the echocardiogram. Her ejection fraction is definitely decreased from previous, however that was in 2009, making the chronicity of this event difficult to determine.   With a positive troponins and agree with IV heparin, along with the aspirin Plavix starting on.  Using nitroglycerin drip is in place of the Imdur for blood pressure assistance and after/preload reduction.  The main stay treatment will be IV diuresis with further titration of her cardiac medications.  She's been weaned off the BiPAP now to nonrebreather which will then wean over the course of the evening the nasal cannula if possible.  Based on the extensive discussions as it had with the patient and her daughter-in-law, we will not plan to proceed with any type of invasive evaluation. We'll discontinue medical therapy. I don't think that this is an acute MI.  Will monitor in the ICU tonight and potentially tomorrow and plan to move her out of the step down unit or for the next couple days.  CODE STATUS: We did have a long discussion about DNR/DNI during the past 2 clinic visits. They were very thankful that paperwork has now been done and taken care of, and that she is now DNR/DNI. The remaining family members were all quite in agreement with this and they were happy to have this done.  [facility] we'll happily take over the patient is a primary service. I do appreciate Dr. Lavera Guise assisting the admission.    Marykay Lex, M.D., M.S. [facility] [address] [place], [place]  [address]  [phone] Pager # [phone] 12/12/2012 5:33 PM

## 2012-12-12 NOTE — Progress Notes (Signed)
Results for KHALIS, HITTLE (MRN [id number]) as of 12/12/2012 14:58  Ref. Range 12/12/2012 13:13  Troponin I Latest Range: <0.30 ng/mL 0.35 (HH)  Ps Hager advised. No new orders given Will continue to monitor and advise attending as needed.

## 2012-12-12 NOTE — H&P (Signed)
[facility] History and Physical  [name] ZOX:[id number] DOB: 1917-01-13 DOA: 12/12/2012  Referring physician: ED  PCP: Georgann Housekeeper, MD in the nursing home since 2012 , Dr. Zachery Dauer prior to the nursing home admission  The patient will remain on [facility] service during this admission as requested by Dr. Eula Listen Specialists: Dr. Clarene [facility]   Chief Complaint:  Chief Complaint  Patient presents with  . Shortness of Breath     HPI: [name] is a 79 y.o. female with past medical history for coronary artery disease and congestive heart failure who was brought from a nursing home today with complaints of severe dyspnea. She was found to have oxygen saturations in the 60s. He was transferred to the emergency room with a 15 L oxygen nonrebreather. She was diagnosed with pulmonary edema and placed on BiPAP. She was given IV furosemide and we were called for admission. Patient denies chest pain at the moment. There was no reported fever chills or coughing.    Review of Systems: unobtainable - patient is on BiPAP unable to talk in full sentences  Past Medical History  Diagnosis Date  . Congenital anisocoria   . Hallux valgus   . Lung nodule 1999    stable on followup CT  . DVT of lower extremity (deep venous thrombosis) 7/200o  . 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 Lumbar stenosis 2001  . S/P cardiac cath 08/2005    stents open  . 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 S/P endoscopy 12/07    mild gastritis  . Normal exercise sestamibi stress test 4/07  . Atrial fibrillation   . Anxiety   . COPD (chronic obstructive pulmonary disease)   . GERD (gastroesophageal reflux disease)   . Angina   . Shortness of breath   . Chronic kidney disease   . Coronary artery disease   . Hypertension   . CHF (congestive heart failure)   . Hypothyroidism   . Arthritis   . Pneumonia   . Anemia   . Neuromuscular disorder   . Depression    Past  Surgical History  Procedure Laterality Date  . Thyroidectomy, partial  1933  . Coronary angioplasty with stent placement  9/99  . Appendectomy    . Cervical discectomy    . Cholecystectomy    . Coronary angioplasty with stent placement  2000  . Rotator cuff repair    . Orif hip fracture  2001  . Splenectomy    . Femur im nail  10/09/2011    Procedure: INTRAMEDULLARY (IM) NAIL FEMORAL;  Surgeon: Kathryne Hitch;  Location: [facility] OR;  Service: Orthopedics;  Laterality: Left;   Social History:  reports that she quit smoking about 41 years ago. Her smokeless tobacco use includes Chew. Her alcohol and drug histories are not on file.  Allergies  Allergen Reactions  . Atorvastatin     REACTION: Palpitations  . Carbidopa W-Levodopa Diarrhea and Nausea Only     vivid dreams  . Codeine Nausea Only  . Duloxetine     REACTION: Constipation  . Gabapentin Nausea Only    REACTION: Pruritic rash,  lightheadedness  . Sulfamethoxazole W-Trimethoprim Nausea Only  . Trimethoprim Other (See Comments)    Per mar  . Venlafaxine     REACTION: orthostasis    Family history is unobtainable as patient is on BiPAP  Prior to Admission medications   Medication Sig Start Date End Date Taking?  Authorizing Provider  acetaminophen (TYLENOL) 325 MG tablet Take 650 mg by mouth at bedtime.   Yes Historical Provider, MD  albuterol (PROVENTIL HFA;VENTOLIN HFA) 108 (90 BASE) MCG/ACT inhaler Inhale 1 puff into the lungs daily as needed. For wheezing. 10/03/11  Yes Tobin Chad, MD  albuterol (PROVENTIL HFA;VENTOLIN HFA) 108 (90 BASE) MCG/ACT inhaler Inhale 2 puffs into the lungs 3 (three) times daily.   Yes Historical Provider, MD  aspirin (BABY ASPIRIN) 81 MG chewable tablet Chew 81 mg by mouth every morning.    Yes Historical Provider, MD  Calcium Carbonate-Vitamin D (OYSCO 500+D PO) Take 1 tablet by mouth 2 (two) times daily.   Yes Historical Provider, MD  clopidogrel (PLAVIX) 75 MG tablet Take 75 mg by  mouth every morning.    Yes Historical Provider, MD  diclofenac sodium (VOLTAREN) 1 % GEL Apply 2 g topically 2 (two) times daily. To left shoulder   Yes Historical Provider, MD  docusate sodium (COLACE) 100 MG capsule Take 100 mg by mouth 2 (two) times daily.   Yes Historical Provider, MD  ferrous sulfate 325 (65 FE) MG tablet Take 325 mg by mouth every morning.    Yes Historical Provider, MD  fish oil-omega-3 fatty acids 1000 MG capsule Take 1 g by mouth every morning.    Yes Historical Provider, MD  folic acid (FOLVITE) 400 MCG tablet Take 800 mcg by mouth every morning.   Yes Historical Provider, MD  furosemide (LASIX) 20 MG tablet Take 20 mg by mouth 2 (two) times daily.   Yes Historical Provider, MD  isosorbide mononitrate (IMDUR) 60 MG 24 hr tablet Take 60 mg by mouth at bedtime.   Yes Historical Provider, MD  lansoprazole (PREVACID) 30 MG capsule Take 30 mg by mouth every morning.    Yes Historical Provider, MD  levothyroxine (LEVOTHROID) 75 MCG tablet Take 1 tablet (75 mcg total) by mouth daily before breakfast. 09/28/11  Yes Tobin Chad, MD  loratadine (CLARITIN) 10 MG tablet Take 10 mg by mouth every morning.   Yes Historical Provider, MD  LORazepam (ATIVAN) 0.5 MG tablet Take 0.25 mg by mouth every morning.   Yes Historical Provider, MD  LORazepam (ATIVAN) 0.5 MG tablet Take 0.5 mg by mouth every evening.   Yes Historical Provider, MD  metoprolol succinate (TOPROL-XL) 25 MG 24 hr tablet Take 75 mg by mouth every morning.   Yes Historical Provider, MD  mirtazapine (REMERON) 7.5 MG tablet Take 7.5 mg by mouth at bedtime.   Yes Historical Provider, MD  morphine (ROXANOL) 20 MG/ML concentrated solution Take 5 mg by mouth every 2 (two) hours as needed for pain. For pain.   Yes Historical Provider, MD  OVER THE COUNTER MEDICATION Take 1,000 Units by mouth every morning. Vitamin D-3   Yes Historical Provider, MD  polyethylene glycol (MIRALAX) powder Take 17 g by mouth every evening. For  constipation   Yes Historical Provider, MD  polyvinyl alcohol (LIQUIFILM TEARS) 1.4 % ophthalmic solution Place 1 drop into both eyes 4 (four) times daily. For dry eyes    Yes Historical Provider, MD  potassium chloride SA (K-DUR,KLOR-CON) 20 MEQ tablet Take 20 mEq by mouth every evening.    Yes Historical Provider, MD  senna (SENOKOT) 8.6 MG TABS Take 1 tablet by mouth every other day.   Yes Historical Provider, MD  sertraline (ZOLOFT) 25 MG tablet Take 25 mg by mouth every morning.   Yes Historical Provider, MD  Tamsulosin HCl (FLOMAX) 0.4 MG  CAPS Take 1 capsule (0.4 mg total) by mouth daily after breakfast. 10/12/11  Yes Ozella Rocks, MD  traMADol (ULTRAM) 50 MG tablet Take 50 mg by mouth 3 (three) times daily.   Yes Historical Provider, MD  vitamin B-12 (CYANOCOBALAMIN) 1000 MCG tablet Take 1,000 mcg by mouth every morning.   Yes Historical Provider, MD  LORazepam (ATIVAN) 0.5 MG tablet Take 0.5 mg by mouth every 6 (six) hours as needed. For anxiety    Historical Provider, MD  nitroGLYCERIN (NITROSTAT) 0.4 MG SL tablet Place 0.4 mg under the tongue every 5 (five) minutes x 3 doses as needed. For chest pain.    Historical Provider, MD  oxyCODONE-acetaminophen (PERCOCET) 5-325 MG per tablet Take 1 tablet by mouth every 4 (four) hours as needed. pain 10/12/11   Ozella Rocks, MD   Physical Exam: Filed Vitals:   12/12/12 1015 12/12/12 1026 12/12/12 1045 12/12/12 1106  BP: 163/97 118/65 120/83 103/61  Pulse: 102 92 93 80  Resp: 29 26 45 22  SpO2: 100% 99% 97% 97%     General:  Alert, oriented to son  Eyes: Pupil equal round react to light accommodation  ENT: Unable to examine  Neck: Positive jugular venous distention  Cardiovascular: Tachycardic regular, no appreciable murmur  Respiratory: Bilateral crackles and rhonchi  Abdomen: Soft, nontender, nondistended  Skin: Pale dry without rashes  Musculoskeletal: Grossly intact  Psychiatric: Unable to test  Neurologic:  Strength 5 out of 5 in all 4 extremities, unable to perform full neurological exam due to distress  Labs on Admission:  Basic Metabolic Panel:  Recent Labs Lab 12/12/12 0925 12/12/12 0930  NA 137 139  K 4.5 4.8  CL 105 99  CO2  --  28  GLUCOSE 255* 243*  BUN 27* 26*  CREATININE 1.20* 1.15*  CALCIUM  --  9.8   Liver Function Tests:  Recent Labs Lab 12/12/12 0930  AST 18  ALT 7  ALKPHOS 91  BILITOT 0.3  PROT 7.8  ALBUMIN 3.9   No results found for this basename: LIPASE, AMYLASE,  in the last 168 hours No results found for this basename: AMMONIA,  in the last 168 hours CBC:  Recent Labs Lab 12/12/12 0925 12/12/12 0930  WBC  --  14.7*  NEUTROABS  --  10.8*  HGB 13.6 13.0  HCT 40.0 41.3  MCV  --  98.8  PLT  --  253   Cardiac Enzymes:  Recent Labs Lab 12/12/12 0930  TROPONINI <0.30    BNP (last 3 results)  Recent Labs  12/12/12 0930  PROBNP 6291.0*   CBG: No results found for this basename: GLUCAP,  in the last 168 hours  Radiological Exams on Admission: Dg Chest Portable 1 View  12/12/2012  *RADIOLOGY REPORT*  Clinical Data: Short of breath.  PORTABLE CHEST - 1 VIEW  Comparison: 10/08/2011  Findings: Mild cardiac enlargement.  There are bilateral pleural effusions identified, left greater than right.  Moderate interstitial edema with bilateral airspace consolidation.  IMPRESSION:  1.  CHF. 2.  Bilateral airspace opacities which may be due to areas of asymmetric edema or multifocal infection.   Original Report Authenticated By: Signa Kell, M.D.    Echocardiogram obtained in the emergency room reveals a depressed ejection fraction 35% and new wall motion abnormalities EKG shows sinus tachycardia and negative T waves in the inferior leads  Assessment/Plan Principal Problem:   Acute respiratory failure Active Problems:   HYPOTHYROIDISM, UNSPECIFIED   HYPERLIPIDEMIA  PARKINSON'S DISEASE   CORONARY, ARTERIOSCLEROSIS   COPD   GASTROESOPHAGEAL  REFLUX, NO ESOPHAGITIS   RENAL DISEASE, CHRONIC, STAGE III   ATRIAL FIBRILLATION, PAROXYSMAL, HX OF   Acute on chronic diastolic CHF (congestive heart failure), NYHA class 4   S/P CABG (coronary artery bypass graft)   1. Acute respiratory failure secondary to pulmonary edema from acute on chronic diastolic congestive heart failure. Most likely inciting factor is dietary noncompliance and slow volume gain. Alternatively there is a possibility patient may have ischemia. We'll continue BiPAP support, IV Lasix, IV nitroglycerin and obtain cardiology consultation 2. Hypothyroidism-continue Synthroid but use the IV form as the patient is n.p.o. 3. Acute systolic congestive heart failure 4. Coronary artery disease status post remote stenting and coronary artery bypass grafting-continue aspirin and Plavix-IV heparin added by cardiology   Code Status: dnr  Family Communication: son  Disposition Plan: snf   Lavon Horn [facility] Pager [phone]  If 7PM-7AM, please contact night-coverage [email or web address] Password [facility] 12/12/2012, 11:34 AM

## 2012-12-12 NOTE — Care Management Note (Signed)
    Page 1 of 1   12/12/2012     12:52:06 PM   CARE MANAGEMENT NOTE 12/12/2012  Patient:  [name], [name]   Account Number:  [id number]  Date Initiated:  12/12/2012  Documentation initiated by:  Junius Creamer  Subjective/Objective Assessment:   adm w chf     Action/Plan:   from nsg facility   Anticipated DC Date:     Anticipated DC Plan:    In-house referral  Clinical Social Worker      DC Planning Services  CM consult      Choice offered to / List presented to:             Status of service:   Medicare Important Message given?   (If response is "NO", the following Medicare IM given date fields will be blank) Date Medicare IM given:   Date Additional Medicare IM given:    Discharge Disposition:    Per UR Regulation:  Reviewed for med. necessity/level of care/duration of stay  If discussed at Long Length of Stay Meetings, dates discussed:    Comments:  2/12 1251 debbie Gennett Garcia rn,bsn

## 2012-12-13 LAB — BASIC METABOLIC PANEL
CO2: 31 mEq/L (ref 19–32)
Calcium: 9.2 mg/dL (ref 8.4–10.5)
Chloride: 101 mEq/L (ref 96–112)
Glucose, Bld: 104 mg/dL — ABNORMAL HIGH (ref 70–99)
Potassium: 3.8 mEq/L (ref 3.5–5.1)
Sodium: 144 mEq/L (ref 135–145)

## 2012-12-13 LAB — CBC
HCT: 34.2 % — ABNORMAL LOW (ref 36.0–46.0)
Hemoglobin: 11 g/dL — ABNORMAL LOW (ref 12.0–15.0)
MCV: 97.4 fL (ref 78.0–100.0)
Platelets: 215 10*3/uL (ref 150–400)
RBC: 3.51 MIL/uL — ABNORMAL LOW (ref 3.87–5.11)
WBC: 9.8 10*3/uL (ref 4.0–10.5)

## 2012-12-13 LAB — HEPARIN LEVEL (UNFRACTIONATED): Heparin Unfractionated: 0.26 IU/mL — ABNORMAL LOW (ref 0.30–0.70)

## 2012-12-13 MED ORDER — GUAIFENESIN ER 600 MG PO TB12
600.0000 mg | ORAL_TABLET | Freq: Two times a day (BID) | ORAL | Status: DC
  Administered 2012-12-13 – 2012-12-18 (×10): 600 mg via ORAL
  Filled 2012-12-13 (×12): qty 1

## 2012-12-13 MED ORDER — ISOSORBIDE MONONITRATE ER 60 MG PO TB24
60.0000 mg | ORAL_TABLET | Freq: Every day | ORAL | Status: DC
  Administered 2012-12-13 – 2012-12-17 (×5): 60 mg via ORAL
  Filled 2012-12-13 (×6): qty 1

## 2012-12-13 MED ORDER — SODIUM CHLORIDE 0.9 % IV SOLN
INTRAVENOUS | Status: DC
  Administered 2012-12-13: 10 mL/h via INTRAVENOUS

## 2012-12-13 MED ORDER — FUROSEMIDE 10 MG/ML IJ SOLN
40.0000 mg | Freq: Two times a day (BID) | INTRAMUSCULAR | Status: DC
  Administered 2012-12-13 – 2012-12-14 (×3): 40 mg via INTRAVENOUS
  Filled 2012-12-13 (×5): qty 4

## 2012-12-13 MED ORDER — SODIUM CHLORIDE 0.9 % IV BOLUS (SEPSIS): 500.0000 mL | Freq: Once | INTRAVENOUS | Status: DC

## 2012-12-13 MED ORDER — METOPROLOL SUCCINATE ER 25 MG PO TB24
25.0000 mg | ORAL_TABLET | Freq: Every day | ORAL | Status: DC
  Administered 2012-12-13 – 2012-12-18 (×6): 25 mg via ORAL
  Filled 2012-12-13 (×6): qty 1

## 2012-12-13 NOTE — Evaluation (Addendum)
Physical Therapy Evaluation Patient Details Name: [name] MRN: [id number] DOB: 1917-06-26 Today's Date: 12/13/2012 Time: 4098-1191 PT Time Calculation (min): 25 min  PT Assessment / Plan / Recommendation Clinical Impression  79 y/o female admitted from [facility] with acute respiratory failure needing non-rebreather in ED. Presents to PT today with below impairments affecting independence and quality of life. Will benefit physical therapy in the acute setting to maxmize mobility for decreased burden of care at [facility] upon d/c.     PT Assessment  Patient needs continued PT services    Follow Up Recommendations  SNF    Does the patient have the potential to tolerate intense rehabilitation      Barriers to Discharge        Equipment Recommendations  None recommended by PT    Recommendations for Other Services     Frequency Min 2X/week    Precautions / Restrictions Precautions Precautions: Fall Restrictions Weight Bearing Restrictions: No   Pertinent Vitals/Pain Dyspneic needing 4 liters O2 for mobility and rest breaks throughout      Mobility  Bed Mobility Bed Mobility: Supine to Sit;Sitting - Scoot to Edge of Bed Supine to Sit: 3: Mod assist Sitting - Scoot to Edge of Bed: 3: Mod assist Details for Bed Mobility Assistance: mod facilitation to guide movement due to weakness with verbal cues to sequence; use of pad for reciprocal scoot Transfers Transfers: Stand Pivot Transfers Stand Pivot Transfers: 3: Mod assist;With armrests Details for Transfer Assistance: mod facilitation to bring trunk over BOS and guide hips to chair; patient with difficulty stepping to pivot (bed->chair) Ambulation/Gait Ambulation/Gait Assistance: Not tested (comment) (pt reports seldom ambulation at SNF) Stairs: No         PT Diagnosis: Difficulty walking;Abnormality of gait;Generalized weakness  PT Problem List: Decreased strength;Decreased activity tolerance;Decreased  balance;Decreased mobility PT Treatment Interventions: DME instruction;Gait training;Functional mobility training;Therapeutic activities;Therapeutic exercise;Balance training;Neuromuscular re-education;Patient/family education;Wheelchair mobility training   PT Goals Acute Rehab PT Goals PT Goal Formulation: With patient Time For Goal Achievement: 12/20/12 Potential to Achieve Goals: Good Pt will go Supine/Side to Sit: with min assist PT Goal: Supine/Side to Sit - Progress: Goal set today Pt will go Sit to Supine/Side: with min assist Pt will go Sit to Stand: with min assist PT Goal: Sit to Stand - Progress: Goal set today Pt will go Stand to Sit: with min assist PT Goal: Stand to Sit - Progress: Goal set today Pt will Transfer Bed to Chair/Chair to Bed: with min assist PT Transfer Goal: Bed to Chair/Chair to Bed - Progress: Goal set today Pt will Perform Home Exercise Program: with supervision, verbal cues required/provided PT Goal: Perform Home Exercise Program - Progress: Goal set today  Visit Information  Last PT Received On: 12/13/12 Assistance Needed: +2    Subjective Data  Subjective: I want to get out of this bed.  Patient Stated Goal: home (and for the snow to stop falling)   Prior Functioning  Home Living Type of Home: Skilled Nursing Facility Home Layout: One level Home Adaptive Equipment: Wheelchair - manual;Walker - rolling Additional Comments: stands to get to her w/c and can get around in her room rather independently using her w/c, reports she was bathing and dressing herself there?? (per notes she has dementia) Prior Function Level of Independence: Needs assistance Able to Take Stairs?: No Driving: No Vocation: Unemployed Communication Communication: Surveyor, mining Overall Cognitive Status: History of cognitive impairments - at baseline Arousal/Alertness: Awake/alert Orientation Level:  Appears intact for tasks assessed Behavior During  Session: [facility] for tasks performed Cognition - Other Comments: very pleasant    Extremity/Trunk Assessment Right Upper Extremity Assessment RUE ROM/Strength/Tone: Deficits RUE ROM/Strength/Tone Deficits: generalized weakness, grossly 3-4/5 Left Upper Extremity Assessment LUE ROM/Strength/Tone: Deficits LUE ROM/Strength/Tone Deficits: generalized weaknes, grossly 3-4/5 Right Lower Extremity Assessment RLE ROM/Strength/Tone: Deficits RLE ROM/Strength/Tone Deficits: generalized weakness, grossly 3-4/5 Left Lower Extremity Assessment LLE ROM/Strength/Tone: Deficits LLE ROM/Strength/Tone Deficits: generalized weakness, grossly 3-4/5 Trunk Assessment Trunk Assessment: Kyphotic   Balance Balance Balance Assessed: Yes Static Sitting Balance Static Sitting - Balance Support: Bilateral upper extremity supported Static Sitting - Level of Assistance: 5: Stand by assistance  End of Session PT - End of Session Equipment Utilized During Treatment: Gait belt Activity Tolerance: Patient tolerated treatment well;Patient limited by fatigue Patient left: in chair;with call bell/phone within reach Nurse Communication: Mobility status  GP     [facility] HELEN 12/13/2012, 2:41 PM

## 2012-12-13 NOTE — Progress Notes (Signed)
I have seen and evaluated the patient this AM along with Wilburt Finlay, PA. I agree with his findings, examination as well as impression recommendations.  I had a long talk ~73min with the patient re: her current condition - with evidence of "recent" LAD distribution infarct.   She is pretty clear that she is happy with the level & type of care she has received for this acute event -- she would not want ETT, or CPR & would also not want invasive procedures such as cardiac catheterizations.  I do not feel that she is quite at the stage of Palliative Care Consult, but the reduced EF & HF exacerbation certainly brings these concepts to the forefront.  Her biggest fear is "shortness of breath", but states that she is also ready to "go" when God is ready to take her.    Will restart BB & convert from IV to PO nitrates  Have reduced Lasix to BID dosing starting today x ~2 days (will need to assess on a daily basis)  Continue [place] O2 for patient comfort, but please record SaO2 on & off O2.  With evidence of WMA on Echo, will continue with Plavix as secondary prevention  Have written for a diet.  She has been troubled with confusion / orientation & some anxiety / sundowning.  At this point, I do not feel comfortable transferring her to a different room -- she asked not to be moved, since she is finally "comfortable here" as it is now "familiar."   Marykay Lex, M.D., M.S. [facility] [address] [place], [place]  [address]  [phone] Pager # [phone] 12/13/2012 10:09 AM

## 2012-12-13 NOTE — Progress Notes (Signed)
[facility] The patient is a 79 yo female who lives at [facility] with a history of CAD with PCI to the RCA and LAD, moderate AV sclerosis, chronic diastolic HF. She had a cardiac cath in 2007 because of a positive NST which revealed multivessel disease and two patent stents in the LAD and one in the prox ramus branch. LVEF was normal.   Subjective: Feeling better.  No CP.  Occasional SOB.   Objective: Vital signs in last 24 hours: Temp:  [98.6 F (37 C)-99.1 F (37.3 C)] 99 F (37.2 C) (02/13 0400) Pulse Rate:  [71-108] 90 (02/13 0700) Resp:  [17-45] 25 (02/13 0700) BP: (71-170)/(30-99) 143/70 mmHg (02/13 0700) SpO2:  [91 %-100 %] 100 % (02/13 0700) FiO2 (%):  [40 %-100 %] 100 % (02/12 2100) Weight:  [56 kg (123 lb 7.3 oz)-64.6 kg (142 lb 6.7 oz)] 64.6 kg (142 lb 6.7 oz) (02/13 0500)    Intake/Output from previous day: 02/12 0701 - 02/13 0700 In: 101 [I.V.:101] Out: 2550 [Urine:2550] Intake/Output this shift:    Medications Current Facility-Administered Medications  Medication Dose Route Frequency Provider Last Rate Last Dose  . aspirin chewable tablet 81 mg  81 mg Oral q morning - 10a Sorin Luanne Bras, MD      . clopidogrel (PLAVIX) tablet 75 mg  75 mg Oral q morning - 10a Sorin C Laza, MD      . furosemide (LASIX) injection 40 mg  40 mg Intravenous Q6H Sorin C Laza, MD   40 mg at 12/12/12 1959  . heparin ADULT infusion 100 units/mL (25000 units/250 mL)  800 Units/hr Intravenous Continuous Sorin Luanne Bras, MD 8 mL/hr at 12/13/12 0120 800 Units/hr at 12/13/12 0120  . levothyroxine (SYNTHROID, LEVOTHROID) injection 26 mcg  26 mcg Intravenous Q breakfast Sorin Luanne Bras, MD      . LORazepam (ATIVAN) injection 0.5 mg  0.5 mg Intravenous Q6H PRN Sorin Luanne Bras, MD      . morphine 2 MG/ML injection 2 mg  2 mg Intravenous Q4H PRN Sorin Luanne Bras, MD   2 mg at 12/12/12 2155  . nitroGLYCERIN 0.2 mg/mL in dextrose 5 % infusion  2-200 mcg/min Intravenous Titrated Marykay Lex, MD   5 mcg/min at 12/12/12 1700  . sodium chloride 0.9 % bolus 500 mL  500 mL Intravenous Once Leda Gauze, NP      . sodium chloride 0.9 % injection 3 mL  3 mL Intravenous Q12H Sorin Luanne Bras, MD   3 mL at 12/12/12 2157    PE: General appearance: alert, cooperative and no distress Lungs: clear to auscultation bilaterally Heart: regular rate and rhythm, S1, S2 normal, no murmur, click, rub or gallop Extremities: 2+ LEE Pulses: 2+ and symmetric Skin: WArm and dry Neurologic: Grossly normal  Lab Results:   Recent Labs  12/12/12 0925 12/12/12 0930  WBC  --  14.7*  HGB 13.6 13.0  HCT 40.0 41.3  PLT  --  253   BMET  Recent Labs  12/12/12 0925 12/12/12 0930  NA 137 139  K 4.5 4.8  CL 105 99  CO2  --  28  GLUCOSE 255* 243*  BUN 27* 26*  CREATININE 1.20* 1.15*  CALCIUM  --  9.8   PT/INR  Recent Labs  12/12/12 0930  LABPROT 13.6  INR 1.05   Study Conclusions  - Procedure narrative: Transthoracic echocardiography. Image quality was adequate. The study was technically difficult, as a result  of poor acoustic windows, poor sound wave transmission, and restricted patient mobility. - Left ventricle: The cavity size was normal. There was moderate concentric hypertrophy with a sigmoid septum. Systolic function was moderately reduced. The estimated ejection fraction was in the range of 35% to 40%. There is enhanced echogenicity and thinning of the anteroseptal, anterior, apical and apical inferior walls consistent with prior LAD infarct. Doppler parameters are consistent with pseduonormal left ventricular relaxation (grade 2 diastolic dysfunction). The E/e' ratio is >20, suggesting markedly elevated LV Filling pressure. - Aortic valve: Sclerosis without stenosis. No regurgitation. Valve area: 1.58cm^2(VTI). Valve area: 1.43cm^2 (Vmax). - Aorta: The aorta was poorly visualized. - Mitral valve: Poorly visualized. Calcified annulus. Trivial  regurgitation. - Left atrium: Severely dilated (>26 cm2). - Tricuspid valve: Mild regurgitation. - Pulmonary arteries: PA peak pressure: 54mm Hg (S). - Inferior vena cava: The IVC measures <2.1 cm, but does not collapse, suggesting an elevated RA pressure of 10 mmHg. - Pericardium, extracardiac: There was no pericardial effusion. There was a left pleural effusion.    Assessment/Plan   Principal Problem:   Acute respiratory failure Active Problems:   Acute on chronic combined systolic and diastolic CHF, NYHA class 3   NSTEMI (non-ST elevated myocardial infarction)   HYPOTHYROIDISM, UNSPECIFIED   HYPERLIPIDEMIA   PARKINSON'S DISEASE   CORONARY, ARTERIOSCLEROSIS   COPD   GASTROESOPHAGEAL REFLUX, NO ESOPHAGITIS   RENAL DISEASE, CHRONIC, STAGE III   ATRIAL FIBRILLATION, PAROXYSMAL, HX OF   S/P CABG (coronary artery bypass graft)   Stable angina  Plan:  Troponin peaked at 0.35. Third was <0.30.  IV heparin was started.  Echo showed decreased EF to 35-40%.  Grade 2 diastolic dysfunction.  PA pres .  LAsix dosing at 40mg  IV Q6.  Net fluids -2.4L in the last 24 hours.  Recommend continuing current lasix dosing for now.  Labs pending.   BP and HR stable..   LOS: 1 day    Tyisha Cressy 12/13/2012 7:14 AM

## 2012-12-13 NOTE — Progress Notes (Signed)
Pharmacist Heart Failure Core Measure Documentation  Assessment: [name] has an EF documented as 35-40% on 12/12/2012 by Hilty.  Rationale: Heart failure patients with left ventricular systolic dysfunction (LVSD) and an EF < 40% should be prescribed an angiotensin converting enzyme inhibitor (ACEI) or angiotensin receptor blocker (ARB) at discharge unless a contraindication is documented in the medical record.  This patient is not currently on an ACEI or ARB for HF.  This note is being placed in the record in order to provide documentation that a contraindication to the use of these agents is present for this encounter.  ACE Inhibitor or Angiotensin Receptor Blocker is contraindicated (specify all that apply)  []   ACEI allergy AND ARB allergy []   Angioedema []   Moderate or severe aortic stenosis []   Hyperkalemia []   Hypotension []   Renal artery stenosis [x]   Worsening renal function, preexisting renal disease or dysfunction    Rajah Tagliaferro 12/13/2012 11:29 AM

## 2012-12-13 NOTE — Progress Notes (Signed)
ANTICOAGULATION CONSULT NOTE  Pharmacy Consult for heparin Indication: chest pain/ACS  Allergies  Allergen Reactions  . Atorvastatin     REACTION: Palpitations  . Carbidopa W-Levodopa Diarrhea and Nausea Only     vivid dreams  . Codeine Nausea Only  . Duloxetine     REACTION: Constipation  . Gabapentin Nausea Only    REACTION: Pruritic rash,  lightheadedness  . Sulfamethoxazole W-Trimethoprim Nausea Only  . Trimethoprim Other (See Comments)    Per mar  . Venlafaxine     REACTION: orthostasis    Patient Measurements: Height: 5' 1.02" (155 cm) Weight: 123 lb 7.3 oz (56 kg) IBW/kg (Calculated) : 47.86 Heparin Dosing Weight: 55kg  Vital Signs: Temp: 99 F (37.2 C) (02/12 2000) Temp src: Axillary (02/12 2000) BP: 88/41 mmHg (02/13 0005) Pulse Rate: 79 (02/13 0005)  Labs:  Recent Labs  12/12/12 0925 12/12/12 0930 12/12/12 1313 12/12/12 1954 12/13/12 0001  HGB 13.6 13.0  --   --   --   HCT 40.0 41.3  --   --   --   PLT  --  253  --   --   --   LABPROT  --  13.6  --   --   --   INR  --  1.05  --   --   --   HEPARINUNFRC  --   --   --   --  0.26*  CREATININE 1.20* 1.15*  --   --   --   TROPONINI  --  <0.30 0.35* <0.30  --     Estimated Creatinine Clearance: 23.1 ml/min (by C-G formula based on Cr of 1.15).  Assessment: 79 yo female with ACS for heparin  Goal of Therapy:  Heparin level 0.3-0.7 units/ml Monitor platelets by anticoagulation protocol: Yes   Plan:  Increase Heparin 800 units/hr   Follow-up am labs.  Eddie Candle 12/13/2012,1:12 AM

## 2012-12-14 LAB — PRO B NATRIURETIC PEPTIDE: Pro B Natriuretic peptide (BNP): 8472 pg/mL — ABNORMAL HIGH (ref 0–450)

## 2012-12-14 LAB — CBC
Hemoglobin: 10.5 g/dL — ABNORMAL LOW (ref 12.0–15.0)
MCH: 31.9 pg (ref 26.0–34.0)
RBC: 3.29 MIL/uL — ABNORMAL LOW (ref 3.87–5.11)

## 2012-12-14 MED ORDER — LEVOTHYROXINE SODIUM 75 MCG PO TABS
75.0000 ug | ORAL_TABLET | Freq: Every day | ORAL | Status: DC
  Administered 2012-12-15 – 2012-12-18 (×4): 75 ug via ORAL
  Filled 2012-12-14 (×6): qty 1

## 2012-12-14 NOTE — Progress Notes (Signed)
Occupational Therapy Discharge Patient Details Name: [name] MRN: [id number] DOB: September 16, 1917 Today's Date: 12/14/2012 Time:  -     Patient discharged from OT services secondary to [facility] SNF in regards to PTA. Pt transfers to w/c with +1 (A) at baseline and total (A) for all ADLS. Pt does not complete ADLS or bed mobility without (A). .  Please see latest therapy progress note for current level of functioning and progress toward goals.    Progress and discharge plan discussed with patient and/or caregiver: Patient unable to participate in discharge planning and no caregivers available   OT TO defer evaluation back to SNF after calling facility and PTA level of care.  GO     Lucile Shutters Pager: [phone]  12/14/2012, 1:27 PM

## 2012-12-14 NOTE — Progress Notes (Signed)
Subjective:   Objective: Vital signs in last 24 hours: Temp:  [97.5 F (36.4 C)-98.9 F (37.2 C)] 98.9 F (37.2 C) (02/14 0400) Pulse Rate:  [81-95] 81 (02/13 1700) Resp:  [14-29] 23 (02/14 0700) BP: (111-138)/(43-110) 123/63 mmHg (02/14 0405) SpO2:  [94 %-99 %] 98 % (02/14 0400) Weight:  [64.2 kg (141 lb 8.6 oz)] 64.2 kg (141 lb 8.6 oz) (02/14 0500) Weight change: 8.2 kg (18 lb 1.3 oz) Last BM Date: 12/13/12 Intake/Output from previous day: -1193 02/13 0701 - 02/14 0700 In: 672 [P.O.:240; I.V.:432] Out: 1855 [Urine:1855] Intake/Output this shift:    PE: General: Heart: Lungs: Abd: Ext:    Lab Results:  Recent Labs  12/13/12 0750 12/14/12 0415  WBC 9.8 9.1  HGB 11.0* 10.5*  HCT 34.2* 31.6*  PLT 215 219   BMET  Recent Labs  12/12/12 0930 12/13/12 0750  NA 139 144  K 4.8 3.8  CL 99 101  CO2 28 31  GLUCOSE 243* 104*  BUN 26* 26*  CREATININE 1.15* 0.96  CALCIUM 9.8 9.2    Recent Labs  12/12/12 1313 12/12/12 1954  TROPONINI 0.35* <0.30    Lab Results  Component Value Date   CHOL 125 06/30/2011   HDL 50 06/30/2011   LDLCALC 46 06/30/2011   LDLDIRECT 47 09/30/2010   TRIG 146 06/30/2011   CHOLHDL 2.5 06/30/2011   No results found for this basename: HGBA1C     Lab Results  Component Value Date   TSH 1.567 12/12/2012    Hepatic Function Panel  Recent Labs  12/12/12 0930  PROT 7.8  ALBUMIN 3.9  AST 18  ALT 7  ALKPHOS 91  BILITOT 0.3  Studies/Results: Dg Chest Portable 1 View  12/12/2012  *RADIOLOGY REPORT*  Clinical Data: Short of breath.  PORTABLE CHEST - 1 VIEW  Comparison: 10/08/2011  Findings: Mild cardiac enlargement.  There are bilateral pleural effusions identified, left greater than right.  Moderate interstitial edema with bilateral airspace consolidation.  IMPRESSION:  1.  CHF. 2.  Bilateral airspace opacities which may be due to areas of asymmetric edema or multifocal infection.   Original Report Authenticated By: Signa Kell, M.D.      Medications: I have reviewed the patient's current medications. Marland Kitchen aspirin  81 mg Oral q morning - 10a  . clopidogrel  75 mg Oral q morning - 10a  . furosemide  40 mg Intravenous BID  . guaiFENesin  600 mg Oral BID  . isosorbide mononitrate  60 mg Oral Daily  . levothyroxine  26 mcg Intravenous Q breakfast  . metoprolol succinate  25 mg Oral Daily  . sodium chloride  3 mL Intravenous Q12H   Assessment/Plan: Principal Problem:   Acute respiratory failure Active Problems:   HYPOTHYROIDISM, UNSPECIFIED   HYPERLIPIDEMIA   PARKINSON'S DISEASE   CORONARY, ARTERIOSCLEROSIS   COPD   GASTROESOPHAGEAL REFLUX, NO ESOPHAGITIS   RENAL DISEASE, CHRONIC, STAGE III   ATRIAL FIBRILLATION, PAROXYSMAL, HX OF   Acute on chronic combined systolic and diastolic CHF, NYHA class 3   S/P CABG (coronary artery bypass graft)   NSTEMI (non-ST elevated myocardial infarction)   Stable angina  PLAN:  IV heparin continues, ? D/c today?  Pro BNP rising. On Lasix 40 IV BID.  LOS: 2 days   Virgen Belland R 12/14/2012, 8:22 AM

## 2012-12-14 NOTE — Progress Notes (Signed)
Pt. Seen and examined. Agree with the NP/PA-C note as written.  Main complaint today is stomach upset. She denies chest pain. Mild troponin elevation, which is likely demand ischemia in the setting of heart failure. Her echo likely shows recent LAD territory infarct, but does not show signs of ongoing ischemia, therefore I will discontinue heparin today. Agree with continuing IV diuresis today, may switch to po tomorrow.   Chrystie Nose, MD, [facility] Attending Cardiologist [facility]

## 2012-12-14 NOTE — Progress Notes (Signed)
Clinical Social Work Department BRIEF PSYCHOSOCIAL ASSESSMENT 12/14/2012  Patient:  AERYN, MEDICI     Account Number:  [id number]     Admit date:  12/12/2012  Clinical Social Worker:  Dennison Bulla  Date/Time:  12/14/2012 03:45 PM  Referred by:  Physician  Date Referred:  12/14/2012 Referred for  SNF Placement   Other Referral:   Interview type:  Patient Other interview type:    PSYCHOSOCIAL DATA Living Status:  FACILITY Admitted from facility:  [facility] Level of care:  Skilled Nursing Facility Primary support name:  Windy Fast Primary support relationship to patient:  CHILD, ADULT Degree of support available:   Strong    CURRENT CONCERNS Current Concerns  Post-Acute Placement   Other Concerns:    SOCIAL WORK ASSESSMENT / PLAN CSW received referral due to patient being admitted from SNF. CSW reviewed chart and met with patient at bedside. No visitors present.    CSW introduced myself and explained role. Patient has lived at [facility] for the past year in LT care. Patient reports that sons will make dc plans and gave CSW permission to contact sons. CSW called and spoke with son, Windy Fast, via phone who reports that patient will return to [facility] at dc. CSW previously spoke with [facility] who requested that family contact facility regarding bed hold. CSW shared information with son who was agreeable to call.    CSW spoke with SNF who reports that they will discuss bed hold with patient but she can return at dc. CSW completed FL2 and placed on chart for MD signature. CSW will continue to follow to assist with dc plans.   Assessment/plan status:  Psychosocial Support/Ongoing Assessment of Needs Other assessment/ plan:   Information/referral to community resources:   Will return to SNF    PATIENT'S/FAMILY'S RESPONSE TO PLAN OF CARE: Patient alert but deferred decision making to sons. Son appreciative of phone call and agreeable to contact  SNF regarding dc plans. Son aware of dc process. Son agreeable to CSW follow up.        Coverage for [facility]

## 2012-12-15 LAB — BASIC METABOLIC PANEL
BUN: 20 mg/dL (ref 6–23)
CO2: 31 mEq/L (ref 19–32)
Chloride: 100 mEq/L (ref 96–112)
Glucose, Bld: 110 mg/dL — ABNORMAL HIGH (ref 70–99)
Potassium: 3.1 mEq/L — ABNORMAL LOW (ref 3.5–5.1)

## 2012-12-15 LAB — CBC
HCT: 32.8 % — ABNORMAL LOW (ref 36.0–46.0)
Hemoglobin: 10.6 g/dL — ABNORMAL LOW (ref 12.0–15.0)
MCHC: 32.3 g/dL (ref 30.0–36.0)
RBC: 3.39 MIL/uL — ABNORMAL LOW (ref 3.87–5.11)
WBC: 8.7 10*3/uL (ref 4.0–10.5)

## 2012-12-15 LAB — URINALYSIS, ROUTINE W REFLEX MICROSCOPIC
Bilirubin Urine: NEGATIVE
Glucose, UA: NEGATIVE mg/dL
Specific Gravity, Urine: 1.02 (ref 1.005–1.030)
pH: 8 (ref 5.0–8.0)

## 2012-12-15 LAB — URINE MICROSCOPIC-ADD ON

## 2012-12-15 MED ORDER — POTASSIUM CHLORIDE CRYS ER 20 MEQ PO TBCR
40.0000 meq | EXTENDED_RELEASE_TABLET | Freq: Once | ORAL | Status: AC
  Administered 2012-12-15: 40 meq via ORAL

## 2012-12-15 MED ORDER — ACETAMINOPHEN 325 MG PO TABS
650.0000 mg | ORAL_TABLET | ORAL | Status: DC | PRN
  Administered 2012-12-15 – 2012-12-16 (×3): 650 mg via ORAL
  Filled 2012-12-15 (×3): qty 2

## 2012-12-15 MED ORDER — FUROSEMIDE 10 MG/ML IJ SOLN
80.0000 mg | Freq: Two times a day (BID) | INTRAMUSCULAR | Status: DC
  Administered 2012-12-15 – 2012-12-17 (×5): 80 mg via INTRAVENOUS
  Filled 2012-12-15 (×7): qty 8

## 2012-12-15 MED ORDER — POTASSIUM CHLORIDE CRYS ER 20 MEQ PO TBCR
40.0000 meq | EXTENDED_RELEASE_TABLET | Freq: Once | ORAL | Status: AC
  Administered 2012-12-15: 40 meq via ORAL
  Filled 2012-12-15 (×2): qty 2

## 2012-12-15 NOTE — Progress Notes (Signed)
[facility]  Subjective: Feeling "OK". No complaints.   Objective: Vital signs in last 24 hours: Temp:  [97.5 F (36.4 C)-98.2 F (36.8 C)] 97.5 F (36.4 C) (02/15 0404) Pulse Rate:  [80] 80 (02/14 1145) Resp:  [15-23] 22 (02/15 0405) BP: (118-153)/(37-89) 142/89 mmHg (02/15 0405) SpO2:  [96 %-100 %] 100 % (02/15 0405) Last BM Date: 12/14/12 (per shift report)  Intake/Output from previous day: 02/14 0701 - 02/15 0700 In: 216 [I.V.:216] Out: 1570 [Urine:1570] Intake/Output this shift: Total I/O In: 90 [I.V.:90] Out: 220 [Urine:220]  Medications Current Facility-Administered Medications  Medication Dose Route Frequency Provider Last Rate Last Dose  . 0.9 %  sodium chloride infusion   Intravenous Continuous Marykay Lex, MD 10 mL/hr at 12/14/12 1900    . aspirin chewable tablet 81 mg  81 mg Oral q morning - 10a Sorin Luanne Bras, MD   81 mg at 12/14/12 1113  . clopidogrel (PLAVIX) tablet 75 mg  75 mg Oral q morning - 10a Sorin Luanne Bras, MD   75 mg at 12/14/12 1113  . furosemide (LASIX) injection 40 mg  40 mg Intravenous BID Marykay Lex, MD   40 mg at 12/14/12 1417  . guaiFENesin (MUCINEX) 12 hr tablet 600 mg  600 mg Oral BID Marykay Lex, MD   600 mg at 12/14/12 2132  . isosorbide mononitrate (IMDUR) 24 hr tablet 60 mg  60 mg Oral Daily Marykay Lex, MD   60 mg at 12/14/12 2132  . levothyroxine (SYNTHROID, LEVOTHROID) tablet 75 mcg  75 mcg Oral QAC breakfast Marykay Lex, MD      . LORazepam (ATIVAN) injection 0.5 mg  0.5 mg Intravenous Q6H PRN Sorin Luanne Bras, MD      . metoprolol succinate (TOPROL-XL) 24 hr tablet 25 mg  25 mg Oral Daily Marykay Lex, MD   25 mg at 12/14/12 1113  . morphine 2 MG/ML injection 2 mg  2 mg Intravenous Q4H PRN Sorin Luanne Bras, MD   2 mg at 12/14/12 1421  . sodium chloride 0.9 % injection 3 mL  3 mL Intravenous Q12H Sorin Luanne Bras, MD   3 mL at 12/14/12 2132    PE: General appearance: alert, cooperative and no  distress Neck: + JVD Lungs: rales bibasilar Heart: regular rate and rhythm Extremities: no LEE Neurologic: Grossly normal  Lab Results:   Recent Labs  12/12/12 0930 12/13/12 0750 12/14/12 0415  WBC 14.7* 9.8 9.1  HGB 13.0 11.0* 10.5*  HCT 41.3 34.2* 31.6*  PLT 253 215 219   BMET  Recent Labs  12/12/12 0925 12/12/12 0930 12/13/12 0750  NA 137 139 144  K 4.5 4.8 3.8  CL 105 99 101  CO2  --  28 31  GLUCOSE 255* 243* 104*  BUN 27* 26* 26*  CREATININE 1.20* 1.15* 0.96  CALCIUM  --  9.8 9.2   PT/INR  Recent Labs  12/12/12 0930  LABPROT 13.6  INR 1.05   Assessment/Plan  Principal Problem:   Acute respiratory failure Active Problems:   HYPOTHYROIDISM, UNSPECIFIED   HYPERLIPIDEMIA   PARKINSON'S DISEASE   CORONARY, ARTERIOSCLEROSIS   COPD   GASTROESOPHAGEAL REFLUX, NO ESOPHAGITIS   RENAL DISEASE, CHRONIC, STAGE III   ATRIAL FIBRILLATION, PAROXYSMAL, HX OF   Acute on chronic combined systolic and diastolic CHF, NYHA class 3   S/P CABG (coronary artery bypass graft)   NSTEMI (non-ST elevated myocardial infarction)   Stable angina  Plan: Pt. diuresed ~1.35 L in past 24 hrs and ~4.98 L since admission. She is currently on IV Lasix, 40 mg BID. She continues to have JVD on exam as well as bibasilar crackles. Will increase Lasix to 80 mg IV BID.  Cr. and K+ 2 days ago both were WNL, at 0.96 and 3.8 respectively. Will recheck BMP today and will follow. Will monitor urine output. Will recheck CXR on Monday. BP is stable. Most recent BP was 142/89. HR is stable and she is maintaining NSR on telemetry. Pt was also admitted with NSTEMI. She wishes not to have any invasive procedures and opts to be treated medically. She is on ASA, Plavix, BB and Imdur. Will continue to monitor. MD to follow with further recommendation.     LOS: 3 days    [name] 12/15/2012 6:53 AM  Agree with note written by Boyce Medici  [facility]  Pt admitted with CHF. 2D shows mod  LVD with EF 35% and seg WMA suggesting LAD distribution infarct. Plan according to Dr. Elissa Hefty note is Med Rx only (No ETT, CPR, invasive procedures) which seem appropriate. Pt resides in SNF where she will return to. Getting IV diuresis though I/Os not significantly neg and pt still c/o SOB and has rales 1/3 up bilaterally. Will increase IV lasix to 80 mg IV BID for 48 hours then reassess. BNP increasing and CXR on 2/12 shows signif pulm edema .  Runell Gess 12/15/2012 8:38 AM

## 2012-12-16 LAB — CBC
HCT: 34.7 % — ABNORMAL LOW (ref 36.0–46.0)
MCV: 95.3 fL (ref 78.0–100.0)
Platelets: 233 10*3/uL (ref 150–400)
RBC: 3.64 MIL/uL — ABNORMAL LOW (ref 3.87–5.11)
WBC: 10.4 10*3/uL (ref 4.0–10.5)

## 2012-12-16 LAB — BASIC METABOLIC PANEL
BUN: 23 mg/dL (ref 6–23)
CO2: 29 mEq/L (ref 19–32)
Chloride: 100 mEq/L (ref 96–112)
Creatinine, Ser: 0.92 mg/dL (ref 0.50–1.10)
GFR calc Af Amer: 60 mL/min — ABNORMAL LOW (ref >90)
Potassium: 3.7 mEq/L (ref 3.5–5.1)

## 2012-12-16 LAB — PRO B NATRIURETIC PEPTIDE: Pro B Natriuretic peptide (BNP): 7217 pg/mL — ABNORMAL HIGH (ref 0–450)

## 2012-12-16 MED ORDER — POTASSIUM CHLORIDE CRYS ER 20 MEQ PO TBCR
40.0000 meq | EXTENDED_RELEASE_TABLET | Freq: Every day | ORAL | Status: DC
  Administered 2012-12-16 – 2012-12-18 (×3): 40 meq via ORAL
  Filled 2012-12-16 (×3): qty 2

## 2012-12-16 NOTE — Progress Notes (Signed)
[facility]  Subjective: Feels "Ok". No complaints. Feels that breathing has improved, but still using [place] at 4L/min.   Objective: Vital signs in last 24 hours: Temp:  [97.5 F (36.4 C)-99.7 F (37.6 C)] 98.6 F (37 C) (02/16 0311) Pulse Rate:  [114] 114 (02/15 0904) Resp:  [21-26] 22 (02/16 0755) BP: (117-164)/(47-71) 117/58 mmHg (02/16 0755) SpO2:  [96 %-98 %] 97 % (02/16 0755) Weight:  [139 lb 8.8 oz (63.3 kg)] 139 lb 8.8 oz (63.3 kg) (02/16 0311) Last BM Date: 12/14/12 (per shift report)  Intake/Output from previous day: 02/15 0701 - 02/16 0700 In: 870 [P.O.:860; I.V.:10] Out: 2730 [Urine:2730] Intake/Output this shift:    Medications Current Facility-Administered Medications  Medication Dose Route Frequency Provider Last Rate Last Dose  . 0.9 %  sodium chloride infusion   Intravenous Continuous Marykay Lex, MD      . acetaminophen (TYLENOL) tablet 650 mg  650 mg Oral Q4H PRN Eda Paschal [place], [place]   650 mg at 12/15/12 2149  . aspirin chewable tablet 81 mg  81 mg Oral q morning - 10a Sorin Luanne Bras, MD   81 mg at 12/15/12 1121  . clopidogrel (PLAVIX) tablet 75 mg  75 mg Oral q morning - 10a Sorin C Laza, MD   75 mg at 12/15/12 1121  . furosemide (LASIX) injection 80 mg  80 mg Intravenous BID Brittainy Simmons, PA-C   80 mg at 12/16/12 0753  . guaiFENesin (MUCINEX) 12 hr tablet 600 mg  600 mg Oral BID Marykay Lex, MD   600 mg at 12/15/12 2149  . isosorbide mononitrate (IMDUR) 24 hr tablet 60 mg  60 mg Oral Daily Marykay Lex, MD   60 mg at 12/15/12 2011  . levothyroxine (SYNTHROID, LEVOTHROID) tablet 75 mcg  75 mcg Oral QAC breakfast Marykay Lex, MD   75 mcg at 12/16/12 0800  . LORazepam (ATIVAN) injection 0.5 mg  0.5 mg Intravenous Q6H PRN Sorin Luanne Bras, MD   0.5 mg at 12/16/12 0021  . metoprolol succinate (TOPROL-XL) 24 hr tablet 25 mg  25 mg Oral Daily Marykay Lex, MD   25 mg at 12/15/12 1121  . morphine 2 MG/ML injection 2 mg  2  mg Intravenous Q4H PRN Sorin Luanne Bras, MD   2 mg at 12/14/12 1421  . sodium chloride 0.9 % injection 3 mL  3 mL Intravenous Q12H Sorin Luanne Bras, MD   3 mL at 12/16/12 0755    PE: General appearance: alert, cooperative and no distress Lungs: mild crackles at lower lung bases, but improved from yesterday Heart: regular rate and rhythm Extremities: no LEE Pulses: 2+ and symmetric Skin: warm and dry Neurologic: Grossly normal  Lab Results:   Recent Labs  12/14/12 0415 12/15/12 0630 12/16/12 0620  WBC 9.1 8.7 10.4  HGB 10.5* 10.6* 11.3*  HCT 31.6* 32.8* 34.7*  PLT 219 208 233   BMET  Recent Labs  12/15/12 0850 12/16/12 0620  NA 142 142  K 3.1* 3.7  CL 100 100  CO2 31 29  GLUCOSE 110* 118*  BUN 20 23  CREATININE 0.79 0.92  CALCIUM 9.5 9.7    Assessment/Plan  Principal Problem:   Acute respiratory failure Active Problems:   HYPOTHYROIDISM, UNSPECIFIED   HYPERLIPIDEMIA   PARKINSON'S DISEASE   CORONARY, ARTERIOSCLEROSIS   COPD   GASTROESOPHAGEAL REFLUX, NO ESOPHAGITIS   RENAL DISEASE, CHRONIC, STAGE III   ATRIAL FIBRILLATION, PAROXYSMAL, HX OF  Acute on chronic combined systolic and diastolic CHF, NYHA class 3   S/P CABG (coronary artery bypass graft)   NSTEMI (non-ST elevated myocardial infarction)   Stable angina  Plan: IV Lasix was increased to 80 mg BID yesterday. She diuresed 1.86 L in the last 24 hrs and 6.9 L since admission. Decreased lung crackles on exam today than yesterday. F/U CXR is ordered for tomorrow. BNP is down to 7,217.0. BNP was 8,472 two days ago. Plan to continue IV Lasix for another day then switch to PO tomorrow. Cr and K both WNL. Pt reports that breathing has improved, but she was on 4L/min of supplemental O2 this morning. Her SpO2 is 97%. Will have nurse titrate down and see if she tolerates this ok and maintains appropriate SpO2 levels. BP and HR both stable. In SR w/ no red alarms on telemetry. Will continue to monitor. MD to follow with  further recommendation.   LOS: 4 days    Brittainy M. Sharol Harness, PA-C  Agree with note written by Boyce Medici  [facility]  Diuresing with increased dose of IV lasix. Breathing better. No periph edema and lungs clear today. OOB to chair. Change to PO diuretics tomorrow and move to tele. PT/OT. CXR am and follow lytes and BNP. Prob back to SNF mid week.  Runell Gess 12/16/2012 8:53 AM 12/16/2012 8:09 AM

## 2012-12-17 ENCOUNTER — Inpatient Hospital Stay (HOSPITAL_COMMUNITY): Payer: Medicare Other

## 2012-12-17 LAB — CBC
HCT: 36.2 % (ref 36.0–46.0)
Hemoglobin: 11.9 g/dL — ABNORMAL LOW (ref 12.0–15.0)
MCHC: 32.9 g/dL (ref 30.0–36.0)
Platelets: 256 10*3/uL (ref 150–400)
RBC: 3.79 MIL/uL — ABNORMAL LOW (ref 3.87–5.11)

## 2012-12-17 LAB — BASIC METABOLIC PANEL
Calcium: 9.9 mg/dL (ref 8.4–10.5)
Calcium: 10.3 mg/dL (ref 8.4–10.5)
Creatinine, Ser: 0.91 mg/dL (ref 0.50–1.10)
Creatinine, Ser: 0.89 mg/dL (ref 0.50–1.10)
GFR calc Af Amer: 61 mL/min — ABNORMAL LOW (ref >90)
GFR calc Af Amer: 63 mL/min — ABNORMAL LOW (ref >90)

## 2012-12-17 LAB — MAGNESIUM: Magnesium: 2.1 mg/dL (ref 1.5–2.5)

## 2012-12-17 MED ORDER — BUMETANIDE 2 MG PO TABS
2.0000 mg | ORAL_TABLET | Freq: Two times a day (BID) | ORAL | Status: DC
  Administered 2012-12-17 – 2012-12-18 (×2): 2 mg via ORAL
  Filled 2012-12-17 (×5): qty 1

## 2012-12-17 NOTE — Progress Notes (Signed)
Physical Therapy Treatment Patient Details Name: [name] MRN: [id number] DOB: 03/04/1917 Today's Date: 12/17/2012 Time: 7829-5621 PT Time Calculation (min): 20 min  PT Assessment / Plan / Recommendation Comments on Treatment Session  79 y/o female with pmh history of CAD with PCI to the RCA and LAD, moderate AV sclerosis, chronic diastolic HF admitted with SOB. Moving relatively well during our session today but limited by HR elevating signficantly (160-170). Pt with increased SOB but denies chest pain. With rest HR back to low 100s and 90s. RN aware. SaO2 following session was 94% on RA but it was not taken during mobility. Pt also agitated and worked up about an event that had happened earlier in the day.     Follow Up Recommendations  SNF     Does the patient have the potential to tolerate intense rehabilitation     Barriers to Discharge        Equipment Recommendations  None recommended by PT    Recommendations for Other Services    Frequency Min 2X/week   Plan Discharge plan remains appropriate;Frequency remains appropriate    Precautions / Restrictions Precautions Precautions: Fall Restrictions Weight Bearing Restrictions: No   Pertinent Vitals/Pain HR 80-90 resting, upon standing it stayed around 115-120, pt sat to rest and immediately upon sitting her HR shot up to 164 but it quickly dropped back down to 80-90 again, RN in to check on patient and after rest she stood again, HR again stayed around 115 and shot up to 170s staying there for a few seconds after we sat her back down but eventually came back to 120s; after session she was back down to 90s just resting in the chair; SpO2 on RA 94% following session but did not get a reading during activity    Mobility  Bed Mobility Bed Mobility: Not assessed Transfers Transfers: Sit to Stand;Stand to Sit Sit to Stand: 1: +2 Total assist;From chair/3-in-1;With upper extremity assist;With armrests Stand to Sit: 1: +2  Total assist;To chair/3-in-1;With upper extremity assist;With armrests Details for Transfer Assistance: cues for hand placement, faciltation for anterior translation of trunk over BOS and stability as she powers up to stand; sit<>stand x2  Ambulation/Gait Ambulation/Gait Assistance: Not tested (comment)      PT Goals Acute Rehab PT Goals PT Goal: Sit to Stand - Progress: Progressing toward goal PT Goal: Stand to Sit - Progress: Progressing toward goal  Visit Information  Last PT Received On: 12/17/12 Assistance Needed: +2    Subjective Data  Subjective: I get short of breath when I talk a lot.  Patient Stated Goal: to get out of the bed! (pt in the recliner)   Cognition  Cognition Overall Cognitive Status: History of cognitive impairments - at baseline Arousal/Alertness: Awake/alert Orientation Level: Disoriented to;Time    [employer]ed forces operational officer Sitting Balance Static Sitting - Balance Support: Bilateral upper extremity supported Static Sitting - Level of Assistance: 1: +2 Total assist (70%) Static Sitting - Comment/# of Minutes: 1 minute the first time and 30 seconds the second; upon sitting back down after the first standing trial her HR elevated to 160s (RN aware and HR quickly dropped back to 80-100); durring 2nd standing trial pts HR stayed steadily at or around 115 until it shot up to 174 upon which pt was sat back in the chair to rest; RN again came in to check patient and session terminated   End of Session PT - End of Session Equipment Utilized During Treatment: Gait belt Activity  Tolerance: Treatment limited secondary to medical complications (Comment) (elevated HR) Patient left: in chair;with call bell/phone within reach Nurse Communication: Mobility status;Other (comment) (elevated HR)   GP     WHITLOW,[name] 12/17/2012, 4:22 PM

## 2012-12-17 NOTE — Progress Notes (Signed)
[facility]  Subjective: No complaints. Denies further SOB.  Objective: Vital signs in last 24 hours: Temp:  [98.2 F (36.8 C)-99.6 F (37.6 C)] 98.7 F (37.1 C) (02/17 0314) Pulse Rate:  [83] 83 (02/16 1327) Resp:  [15-30] 19 (02/17 0314) BP: (129-152)/(60-71) 129/68 mmHg (02/17 0314) SpO2:  [92 %-96 %] 92 % (02/17 0314) Weight:  [132 lb 11.5 oz (60.2 kg)] 132 lb 11.5 oz (60.2 kg) (02/17 0238) Last BM Date: 12/16/12  Intake/Output from previous day: 02/16 0701 - 02/17 0700 In: 460 [P.O.:460] Out: 2260 [Urine:2260] Intake/Output this shift:    Medications Current Facility-Administered Medications  Medication Dose Route Frequency Provider Last Rate Last Dose  . 0.9 %  sodium chloride infusion   Intravenous Continuous Marykay Lex, MD      . acetaminophen (TYLENOL) tablet 650 mg  650 mg Oral Q4H PRN Eda Paschal [place], [place]   650 mg at 12/16/12 2112  . aspirin chewable tablet 81 mg  81 mg Oral q morning - 10a Sorin Luanne Bras, MD   81 mg at 12/16/12 1107  . clopidogrel (PLAVIX) tablet 75 mg  75 mg Oral q morning - 10a Sorin C Laza, MD   75 mg at 12/16/12 1107  . furosemide (LASIX) injection 80 mg  80 mg Intravenous BID Mahlani Berninger, PA-C   80 mg at 12/16/12 1806  . guaiFENesin (MUCINEX) 12 hr tablet 600 mg  600 mg Oral BID Marykay Lex, MD   600 mg at 12/16/12 2102  . isosorbide mononitrate (IMDUR) 24 hr tablet 60 mg  60 mg Oral Daily Marykay Lex, MD   60 mg at 12/16/12 1959  . levothyroxine (SYNTHROID, LEVOTHROID) tablet 75 mcg  75 mcg Oral QAC breakfast Marykay Lex, MD   75 mcg at 12/16/12 0800  . LORazepam (ATIVAN) injection 0.5 mg  0.5 mg Intravenous Q6H PRN Sorin Luanne Bras, MD   0.5 mg at 12/16/12 0021  . metoprolol succinate (TOPROL-XL) 24 hr tablet 25 mg  25 mg Oral Daily Marykay Lex, MD   25 mg at 12/16/12 1107  . morphine 2 MG/ML injection 2 mg  2 mg Intravenous Q4H PRN Sorin Luanne Bras, MD   2 mg at 12/14/12 1421  . potassium  chloride SA (K-DUR,KLOR-CON) CR tablet 40 mEq  40 mEq Oral Daily Benny Deutschman, PA-C   40 mEq at 12/16/12 1107  . sodium chloride 0.9 % injection 3 mL  3 mL Intravenous Q12H Sorin Luanne Bras, MD   3 mL at 12/16/12 2000    PE: General appearance: alert, cooperative, mild distress and without supplemental O2 Lungs: clear to auscultation bilaterally Heart: regular rate and rhythm Extremities: no LEE Pulses: 2+ and symmetric Skin: warm and dry Neurologic: Grossly normal  Lab Results:   Recent Labs  12/15/12 0630 12/16/12 0620 12/17/12 0537  WBC 8.7 10.4 9.4  HGB 10.6* 11.3* 11.9*  HCT 32.8* 34.7* 36.2  PLT 208 233 256   BMET  Recent Labs  12/15/12 0850 12/16/12 0620 12/17/12 0537  NA 142 142 141  K 3.1* 3.7 3.9  CL 100 100 100  CO2 31 29 31   GLUCOSE 110* 118* 116*  BUN 20 23 26*  CREATININE 0.79 0.92 0.91  CALCIUM 9.5 9.7 9.9    Studies/Results: Repeat CXR - pending   Assessment/Plan  Principal Problem:   Acute respiratory failure Active Problems:   HYPOTHYROIDISM, UNSPECIFIED   HYPERLIPIDEMIA   PARKINSON'S DISEASE   CORONARY,  ARTERIOSCLEROSIS   COPD   GASTROESOPHAGEAL REFLUX, NO ESOPHAGITIS   RENAL DISEASE, CHRONIC, STAGE III   ATRIAL FIBRILLATION, PAROXYSMAL, HX OF   Acute on chronic combined systolic and diastolic CHF, NYHA class 3   S/P CABG (coronary artery bypass graft)   NSTEMI (non-ST elevated myocardial infarction)   Stable angina   Plan:  No further SOB. Pt was on 4.L of supplemental O2 yesterday am. It was titrated down yesterday. Pt is no longer requiring supplemental O2. SpO2 of 94% on RA. She was up and out of bed yesterday. Lungs are clear on exam. She will get a repeat CXR today.  BNP yesterday was 7,217 which is down from admission BNP of 8,427. Pt is on 80 mg IV Lasix. She has diuresed ~8.7 L since admission. Consider switching to PO Lasix today.  HR and BP both stable. Labs are WNL. Consider transfer off unit to telemetry.    LOS: 5  days    Daine Gunther M. Delmer Islam 12/17/2012 8:19 AM

## 2012-12-17 NOTE — Progress Notes (Signed)
Patient transferred from 2900 to room 4715. Telemetry monitor leads applied, vital signs taken and patient from stretcher into recliner. Oriented patient to room and callbell system. Pt complains of no pain or discomfort will continue to monitor to end of shift.

## 2012-12-17 NOTE — Progress Notes (Addendum)
Pt. Seen and examined. Agree with the NP/PA-C note as written.  Dyspnea has improved. CXR shows marked improvement in pulmonary edema. On lasix 80 IV bid.  Switch to bumex 2 mg BID. Ok to transfer to telemetry 4700.  Work toward discharge to [facility], possibly tomorrow.  Chrystie Nose, MD, [facility] Attending Cardiologist [facility]

## 2012-12-18 DIAGNOSIS — I255 Ischemic cardiomyopathy: Secondary | ICD-10-CM | POA: Diagnosis present

## 2012-12-18 LAB — BASIC METABOLIC PANEL
BUN: 34 mg/dL — ABNORMAL HIGH (ref 6–23)
Chloride: 101 mEq/L (ref 96–112)
Creatinine, Ser: 1.01 mg/dL (ref 0.50–1.10)
GFR calc Af Amer: 54 mL/min — ABNORMAL LOW (ref >90)
GFR calc non Af Amer: 46 mL/min — ABNORMAL LOW (ref >90)
Potassium: 3.7 mEq/L (ref 3.5–5.1)

## 2012-12-18 LAB — CBC
HCT: 37.9 % (ref 36.0–46.0)
MCHC: 32.7 g/dL (ref 30.0–36.0)
Platelets: 283 10*3/uL (ref 150–400)
RDW: 12.8 % (ref 11.5–15.5)
WBC: 11.3 10*3/uL — ABNORMAL HIGH (ref 4.0–10.5)

## 2012-12-18 LAB — URINE CULTURE

## 2012-12-18 MED ORDER — BUMETANIDE 2 MG PO TABS: 2.0000 mg | ORAL_TABLET | Freq: Two times a day (BID) | ORAL | Status: DC

## 2012-12-18 MED ORDER — METOPROLOL SUCCINATE ER 50 MG PO TB24: 50.0000 mg | ORAL_TABLET | Freq: Every day | ORAL | Status: DC

## 2012-12-18 MED ORDER — METOPROLOL SUCCINATE ER 25 MG PO TB24: 50.0000 mg | ORAL_TABLET | Freq: Every morning | ORAL | Status: DC

## 2012-12-18 NOTE — Progress Notes (Signed)
Patient's IV and telemetry has been discontinued; patient is being discharged to [facility].  Patient is being transported via EMT; patient's discharge information has been sent to facility, patient is unable to verbalize understanding of discharge instructions.  Lorretta Harp RN

## 2012-12-18 NOTE — Discharge Summary (Signed)
Patient ID: [name],  MRN: [id number], DOB/AGE: [birth year] 77 y.o.  Admit date: 12/12/2012 Discharge date: 12/18/2012  Primary Care Provider: Dr Sheffield Slider Primary Cardiologist:  Dr Herbie Baltimore  Discharge Diagnoses Principal Problem:   Acute respiratory failure Active Problems:   Acute on chronic combined systolic and diastolic CHF, NYHA class 3   PARKINSON'S DISEASE   COPD   RENAL DISEASE, CHRONIC, STAGE III   ATRIAL FIBRILLATION, PAROXYSMAL, HX OF   CAD, RCA PCI '02, LAD/RI '06. Patent  '07   Ischemic cardiomyopathy, EF 35-40% with grade 2 diastlolic dysfunction by echo 12/17/12   HYPOTHYROIDISM, UNSPECIFIED   HYPERLIPIDEMIA   HYPERTENSION, BENIGN SYSTEMIC   GASTROESOPHAGEAL REFLUX, NO ESOPHAGITIS   Stable angina    Hospital Course:   The patient is a 79 y/o known to Dr Herbie Baltimore . She has a history of coronary disease- S/P PCI to the RCA in '02 and the LAD in '06. Her last cath was in 2007 and showed patent stents. She has had mixed systolic and chronic diastolic heart failure and chronic stable angina. She was admitted 12/12/12 with respiratory failure Rx'd with bipap. BNP was 6291. She is felt to be stable to return to [facility] 12/18/12. Her discharge wgt is 62kg.   Discharge Vitals:  Blood pressure 117/67, pulse 92, temperature 97.4 F (36.3 C), temperature source Oral, resp. rate 20, height 5' 1.02" (1.55 m), weight 62.188 kg (137 lb 1.6 oz), SpO2 98.00%.    Labs: Results for orders placed during the hospital encounter of 12/12/12 (from the past 48 hour(s))  CBC     Status: Abnormal   Collection Time    12/17/12  5:37 AM      Result Value Range   WBC 9.4  4.0 - 10.5 K/uL   RBC 3.79 (*) 3.87 - 5.11 MIL/uL   Hemoglobin 11.9 (*) 12.0 - 15.0 g/dL   HCT 95.6  21.3 - 08.6 %   MCV 95.5  78.0 - 100.0 fL   MCH 31.4  26.0 - 34.0 pg   MCHC 32.9  30.0 - 36.0 g/dL   RDW 57.8  46.9 - 62.9 %   Platelets 256  150 - 400 K/uL  BASIC METABOLIC PANEL     Status: Abnormal   Collection Time     12/17/12  5:37 AM      Result Value Range   Sodium 141  135 - 145 mEq/L   Potassium 3.9  3.5 - 5.1 mEq/L   Chloride 100  96 - 112 mEq/L   CO2 31  19 - 32 mEq/L   Glucose, Bld 116 (*) 70 - 99 mg/dL   BUN 26 (*) 6 - 23 mg/dL   Creatinine, Ser 5.28  0.50 - 1.10 mg/dL   Calcium 9.9  8.4 - 41.3 mg/dL   GFR calc non Af Amer 53 (*) >90 mL/min   GFR calc Af Amer 61 (*) >90 mL/min   Comment:            The eGFR has been calculated     using the CKD EPI equation.     This calculation has not been     validated in all clinical     situations.     eGFR's persistently     <90 mL/min signify     possible Chronic Kidney Disease.  MAGNESIUM     Status: None   Collection Time    12/17/12  7:44 AM      Result Value Range  Magnesium 2.1  1.5 - 2.5 mg/dL  BASIC METABOLIC PANEL     Status: Abnormal   Collection Time    12/17/12  7:44 AM      Result Value Range   Sodium 143  135 - 145 mEq/L   Potassium 3.7  3.5 - 5.1 mEq/L   Chloride 100  96 - 112 mEq/L   CO2 30  19 - 32 mEq/L   Glucose, Bld 114 (*) 70 - 99 mg/dL   BUN 25 (*) 6 - 23 mg/dL   Creatinine, Ser 1.61  0.50 - 1.10 mg/dL   Calcium 09.6  8.4 - 04.5 mg/dL   GFR calc non Af Amer 54 (*) >90 mL/min   GFR calc Af Amer 63 (*) >90 mL/min   Comment:            The eGFR has been calculated     using the CKD EPI equation.     This calculation has not been     validated in all clinical     situations.     eGFR's persistently     <90 mL/min signify     possible Chronic Kidney Disease.  CBC     Status: Abnormal   Collection Time    12/18/12  5:45 AM      Result Value Range   WBC 11.3 (*) 4.0 - 10.5 K/uL   RBC 3.97  3.87 - 5.11 MIL/uL   Hemoglobin 12.4  12.0 - 15.0 g/dL   HCT 40.9  81.1 - 91.4 %   MCV 95.5  78.0 - 100.0 fL   MCH 31.2  26.0 - 34.0 pg   MCHC 32.7  30.0 - 36.0 g/dL   RDW 78.2  95.6 - 21.3 %   Platelets 283  150 - 400 K/uL  BASIC METABOLIC PANEL     Status: Abnormal   Collection Time    12/18/12  5:45 AM      Result  Value Range   Sodium 144  135 - 145 mEq/L   Potassium 3.7  3.5 - 5.1 mEq/L   Chloride 101  96 - 112 mEq/L   CO2 27  19 - 32 mEq/L   Glucose, Bld 123 (*) 70 - 99 mg/dL   BUN 34 (*) 6 - 23 mg/dL   Creatinine, Ser 0.86  0.50 - 1.10 mg/dL   Calcium 57.8  8.4 - 46.9 mg/dL   GFR calc non Af Amer 46 (*) >90 mL/min   GFR calc Af Amer 54 (*) >90 mL/min   Comment:            The eGFR has been calculated     using the CKD EPI equation.     This calculation has not been     validated in all clinical     situations.     eGFR's persistently     <90 mL/min signify     possible Chronic Kidney Disease.    Disposition: Discharged to SNF   Discharge Medications:    Medication List    ASK your doctor about these medications       acetaminophen 325 MG tablet  Commonly known as:  TYLENOL  Take 650 mg by mouth at bedtime.     albuterol 108 (90 BASE) MCG/ACT inhaler  Commonly known as:  PROVENTIL HFA;VENTOLIN HFA  Inhale 1 puff into the lungs daily as needed. For wheezing.     albuterol 108 (90 BASE) MCG/ACT inhaler  Commonly known as:  PROVENTIL HFA;VENTOLIN HFA  Inhale 2 puffs into the lungs 3 (three) times daily.     BABY ASPIRIN 81 MG chewable tablet  Generic drug:  aspirin  Chew 81 mg by mouth every morning.     clopidogrel 75 MG tablet  Commonly known as:  PLAVIX  Take 75 mg by mouth every morning.     diclofenac sodium 1 % Gel  Commonly known as:  VOLTAREN  Apply 2 g topically 2 (two) times daily. To left shoulder     docusate sodium 100 MG capsule  Commonly known as:  COLACE  Take 100 mg by mouth 2 (two) times daily.     ferrous sulfate 325 (65 FE) MG tablet  Take 325 mg by mouth every morning.     fish oil-omega-3 fatty acids 1000 MG capsule  Take 1 g by mouth every morning.     folic acid 400 MCG tablet  Commonly known as:  FOLVITE  Take 800 mcg by mouth every morning.     furosemide 20 MG tablet  Commonly known as:  LASIX  Take 20 mg by mouth 2 (two) times  daily.     isosorbide mononitrate 60 MG 24 hr tablet  Commonly known as:  IMDUR  Take 60 mg by mouth at bedtime.     lansoprazole 30 MG capsule  Commonly known as:  PREVACID  Take 30 mg by mouth every morning.     levothyroxine 75 MCG tablet  Commonly known as:  LEVOTHROID  Take 1 tablet (75 mcg total) by mouth daily before breakfast.     loratadine 10 MG tablet  Commonly known as:  CLARITIN  Take 10 mg by mouth every morning.     LORazepam 0.5 MG tablet  Commonly known as:  ATIVAN  Take 0.5 mg by mouth every 6 (six) hours as needed. For anxiety     LORazepam 0.5 MG tablet  Commonly known as:  ATIVAN  Take 0.25 mg by mouth every morning.     LORazepam 0.5 MG tablet  Commonly known as:  ATIVAN  Take 0.5 mg by mouth every evening.     metoprolol succinate 25 MG 24 hr tablet  Commonly known as:  TOPROL-XL  Take 75 mg by mouth every morning.     MIRALAX powder  Generic drug:  polyethylene glycol powder  Take 17 g by mouth every evening. For constipation     mirtazapine 7.5 MG tablet  Commonly known as:  REMERON  Take 7.5 mg by mouth at bedtime.     morphine 20 MG/ML concentrated solution  Commonly known as:  ROXANOL  Take 5 mg by mouth every 2 (two) hours as needed for pain. For pain.     nitroGLYCERIN 0.4 MG SL tablet  Commonly known as:  NITROSTAT  Place 0.4 mg under the tongue every 5 (five) minutes x 3 doses as needed. For chest pain.     OVER THE COUNTER MEDICATION  Take 1,000 Units by mouth every morning. Vitamin D-3     oxyCODONE-acetaminophen 5-325 MG per tablet  Commonly known as:  PERCOCET/ROXICET  Take 1 tablet by mouth every 4 (four) hours as needed. pain     OYSCO 500+D PO  Take 1 tablet by mouth 2 (two) times daily.     polyvinyl alcohol 1.4 % ophthalmic solution  Commonly known as:  LIQUIFILM TEARS  Place 1 drop into both eyes 4 (four) times daily. For dry eyes     potassium chloride SA  20 MEQ tablet  Commonly known as:  K-DUR,KLOR-CON   Take 20 mEq by mouth every evening.     senna 8.6 MG Tabs  Commonly known as:  SENOKOT  Take 1 tablet by mouth every other day.     sertraline 25 MG tablet  Commonly known as:  ZOLOFT  Take 25 mg by mouth every morning.     Tamsulosin HCl 0.4 MG Caps  Commonly known as:  FLOMAX  Take 1 capsule (0.4 mg total) by mouth daily after breakfast.     traMADol 50 MG tablet  Commonly known as:  ULTRAM  Take 50 mg by mouth 3 (three) times daily.     vitamin B-12 1000 MCG tablet  Commonly known as:  CYANOCOBALAMIN  Take 1,000 mcg by mouth every morning.          Duration of Discharge Encounter: Greater than 50 minutes including physician time.  Jolene Provost PA-C 12/18/2012 1:16 PM

## 2012-12-18 NOTE — Progress Notes (Signed)
[facility]  Subjective: Breathing better  Objective: Vital signs in last 24 hours: Temp:  [96.8 F (36 C)-98.5 F (36.9 C)] 96.8 F (36 C) (02/18 0742) Pulse Rate:  [84-164] 96 (02/18 0742) Resp:  [20-22] 20 (02/18 0742) BP: (116-165)/(63-75) 120/63 mmHg (02/18 0742) SpO2:  [95 %-99 %] 95 % (02/18 0742) Weight:  [62.188 kg (137 lb 1.6 oz)] 62.188 kg (137 lb 1.6 oz) (02/18 0441) Last BM Date: 12/18/12  Intake/Output from previous day: 02/17 0701 - 02/18 0700 In: 600 [P.O.:600] Out: 1025 [Urine:1025] Intake/Output this shift: Total I/O In: 240 [P.O.:240] Out: -   Medications Current Facility-Administered Medications  Medication Dose Route Frequency Provider Last Rate Last Dose  . 0.9 %  sodium chloride infusion   Intravenous Continuous Marykay Lex, MD      . acetaminophen (TYLENOL) tablet 650 mg  650 mg Oral Q4H PRN Eda Paschal Zinc, [place]   650 mg at 12/16/12 2112  . aspirin chewable tablet 81 mg  81 mg Oral q morning - 10a Sorin C Laza, MD   81 mg at 12/18/12 0930  . bumetanide (BUMEX) tablet 2 mg  2 mg Oral BID Chrystie Nose, MD   2 mg at 12/18/12 0836  . clopidogrel (PLAVIX) tablet 75 mg  75 mg Oral q morning - 10a Sorin Luanne Bras, MD   75 mg at 12/18/12 0930  . guaiFENesin (MUCINEX) 12 hr tablet 600 mg  600 mg Oral BID Marykay Lex, MD   600 mg at 12/18/12 0930  . isosorbide mononitrate (IMDUR) 24 hr tablet 60 mg  60 mg Oral Daily Marykay Lex, MD   60 mg at 12/17/12 1956  . levothyroxine (SYNTHROID, LEVOTHROID) tablet 75 mcg  75 mcg Oral QAC breakfast Marykay Lex, MD   75 mcg at 12/18/12 [phone]  . LORazepam (ATIVAN) injection 0.5 mg  0.5 mg Intravenous Q6H PRN Sorin Luanne Bras, MD   0.5 mg at 12/16/12 0021  . metoprolol succinate (TOPROL-XL) 24 hr tablet 25 mg  25 mg Oral Daily Marykay Lex, MD   25 mg at 12/18/12 0930  . morphine 2 MG/ML injection 2 mg  2 mg Intravenous Q4H PRN Sorin Luanne Bras, MD   2 mg at 12/14/12 1421  . potassium  chloride SA (K-DUR,KLOR-CON) CR tablet 40 mEq  40 mEq Oral Daily Brittainy Simmons, PA-C   40 mEq at 12/18/12 0930  . sodium chloride 0.9 % injection 3 mL  3 mL Intravenous Q12H Sorin Luanne Bras, MD   3 mL at 12/18/12 0930    PE: General appearance: alert, cooperative and no distress Lungs: clear to auscultation bilaterally Heart: regular rate and rhythm, S1, S2 normal, no murmur, click, rub or gallop Extremities: No LEE Pulses: 2+ and symmetric Skin: Warm and dry Neurologic: Grossly normal  Lab Results:   Recent Labs  12/16/12 0620 12/17/12 0537 12/18/12 0545  WBC 10.4 9.4 11.3*  HGB 11.3* 11.9* 12.4  HCT 34.7* 36.2 37.9  PLT 233 256 283   BMET  Recent Labs  12/17/12 0537 12/17/12 0744 12/18/12 0545  NA 141 143 144  K 3.9 3.7 3.7  CL 100 100 101  CO2 31 30 27   GLUCOSE 116* 114* 123*  BUN 26* 25* 34*  CREATININE 0.91 0.89 1.01  CALCIUM 9.9 10.3 10.1   CHEST - 2 VIEW  Comparison: 12/12/2012 and earlier.  Findings: AP and lateral views of the chest. Interval  significantly improved ventilation.  Bibasilar opacity largely  resolved. No pulmonary edema. No pneumothorax. Mildly low lung  volumes. Small left greater than right pleural effusions persist.  Stable cardiac size and mediastinal contours. Calcified and  tortuous aorta. Osteopenia. No definite acute osseous  abnormality.  IMPRESSION:  Interval significantly improved ventilation with resolved pulmonary edema/opacities. Small residual left greater than right pleural  effusions.   Assessment/Plan  Principal Problem:   Acute respiratory failure Active Problems:   Acute on chronic combined systolic and diastolic CHF, NYHA class 3   NSTEMI (non-ST elevated myocardial infarction)   HYPOTHYROIDISM, UNSPECIFIED   HYPERLIPIDEMIA   PARKINSON'S DISEASE   CORONARY, ARTERIOSCLEROSIS   COPD   GASTROESOPHAGEAL REFLUX, NO ESOPHAGITIS   RENAL DISEASE, CHRONIC, STAGE III   ATRIAL FIBRILLATION, PAROXYSMAL, HX OF   S/P  CABG (coronary artery bypass graft)   Stable angina  Tachycardia  Plan:  Net fluids:  -0.425L in 24 hrs.  IV lasix was changed to Bumex PO yesterday.  Total fluid loss: ~ -9L.  She has been having episodes of sinus tachycardia to the 150's.  Currently 90's.  Will increase Toprol to 50mg  daily.  BP should be able to tolerate it.   CXR significantly improved ventilation with resolved pulmonary edema/opacities. Small residual left greater than right pleural effusions.  She looks stable and ready for DC back to [facility] today.  She has an appt with Dr. Herbie Baltimore on 2/27 already arranged.   LOS: 6 days    HAGER, BRYAN 12/18/2012 10:43 AM   Patient seen and examined. Agree with assessment and plan. Pt feels like she is back to baseline. No chest pain. Breathing better. CXR with improved ventilation. Discussed with family. OK to DC back to [facility] today; f/u with Dr. Herbie Baltimore.   Lennette Bihari, MD, [facility] 12/18/2012 12:45 PM

## 2012-12-18 NOTE — Progress Notes (Signed)
Ok per MD for d/c today back to [facility] via EMS. Ok per patient, SNF and patient's nurse Denequal.  Phone message left for patient's niece Harriett Sine regarding d/c.  No further CSW needs identified.  Lorri Frederick. [place]  [phone]

## 2012-12-18 NOTE — Progress Notes (Signed)
MD made aware of patients increase in weight of 2 kg.  No new orders placed. RN will continue to monitor. Louretta Parma, RN

## 2012-12-18 NOTE — Progress Notes (Signed)
Patient's HR in the 140s nonsustained; patient states she feels ok; patient's HR is now 105, PA notified, will continue to monitor patient.  Lorretta Harp RN

## 2013-07-30 ENCOUNTER — Encounter: Payer: Self-pay | Admitting: *Deleted

## 2013-07-31 ENCOUNTER — Ambulatory Visit (INDEPENDENT_AMBULATORY_CARE_PROVIDER_SITE_OTHER): Payer: Medicare Other | Admitting: Cardiology

## 2013-07-31 ENCOUNTER — Encounter: Payer: Self-pay | Admitting: Cardiology

## 2013-07-31 VITALS — BP 150/82 | HR 83 | Ht 64.0 in | Wt 138.2 lb

## 2013-07-31 DIAGNOSIS — I2589 Other forms of chronic ischemic heart disease: Secondary | ICD-10-CM

## 2013-07-31 DIAGNOSIS — I208 Other forms of angina pectoris: Secondary | ICD-10-CM

## 2013-07-31 DIAGNOSIS — I255 Ischemic cardiomyopathy: Secondary | ICD-10-CM

## 2013-07-31 DIAGNOSIS — I5042 Chronic combined systolic (congestive) and diastolic (congestive) heart failure: Secondary | ICD-10-CM

## 2013-07-31 DIAGNOSIS — I209 Angina pectoris, unspecified: Secondary | ICD-10-CM

## 2013-07-31 DIAGNOSIS — I2510 Atherosclerotic heart disease of native coronary artery without angina pectoris: Secondary | ICD-10-CM

## 2013-07-31 NOTE — Patient Instructions (Addendum)
SEE  YELLOW SHEET FOR nursing home  Increase toresmide  To 60 mg  Twice a day  Then 60 mg in the MORNING  AND 40 MG IN THE EVENING FOR 2 DAYS THEN BACK TO 40 MG  TWICE A DAY    PLEASE ASK THE pcp to examine lungs for residual  Rales after 4-5 days  Your physician wants you to follow-up in 3 months Dr Herbie Baltimore. You will receive a reminder letter in the mail two months in advance. If you don't receive a letter, please call our office to schedule the follow-up appointment.

## 2013-08-01 ENCOUNTER — Encounter: Payer: Self-pay | Admitting: Cardiology

## 2013-08-01 NOTE — Progress Notes (Signed)
PATIENT: [name] MRN: [id number]  DOB: [birth year]   DOV:08/01/2013 PCP: Georgann Housekeeper, MD  Clinic Note: Chief Complaint  Patient presents with  . ROV 6 months    Some chest pain-thinks its more her L shoulder and L arm, shortness of breath on exertion, pain in legs-doesn't walk much since she broke her hip and femur, some swelling.   HPI: [name] is a 79 y.o. female with a PMH below who presents today for six-month followup. She is very complicated elderly woman of 66, who has a long history of coronary disease dating back to 1999 is documented below.  Her last cardiac catheterization was in 2007 showing a widely patent stents.  I first met her in the hospital which is admitted for CHF exacerbation and was thought to be at acute on chronic diastolic and systolic heart failure.  She diuresed well with IV Lasix.  An echocardiogram performed showed moderately reduced EF of 35-40% with the anterior wall motion abnormality consistent with old prior LAD infarct.  Long frank discussions with her and the family made it clear that she was agitated further invasive evaluation.  Therefore, we did not pursue stress testing or cardiac catheterization at that time. I last saw her in February but she  was seen by Mr. Leron Croak after a fall in April, and was doing well at that time.  Interval History: She comes in today he, at [facility] is likely that the story that she tells her daughter-in-law.  She claims to be having been short of breath for the last week with dyspnea on exertion.  She truly does not exert herself much.  These symptoms have not been reported to the nursing facility staff nor to her family.  Based on the daughter-in-law's recollection, her fluid levels have been relatively well controlled with adjusting her diuretic dose.  She also complains of mild resting dyspnea.  I cannot get a clear story of PND or orthopnea however from her she also denies any clear angina symptoms, or at least any  that she would have thought about taking nitroglycerin for.  The remainder of Cardiovascular ROS: positive for - dyspnea on exertion and shortness of breath negative for - chest pain, edema, irregular heartbeat, loss of consciousness, murmur, orthopnea, palpitations, paroxysmal nocturnal dyspnea or rapid heart rate: Additional cardiac review of systems: Lightheadedness - no, dizziness - no, syncope/near-syncope - no; TIA/amaurosis fugax - no Melena - no, hematochezia no; hematuria - no; nosebleeds - no; claudication - no  Past Medical History  Diagnosis Date  . Congenital anisocoria   . Hallux valgus   . Lung nodule 1999    stable on followup CT  . DVT of lower extremity (deep venous thrombosis) 7/200o  . 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 Lumbar stenosis 2001  . 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 S/P endoscopy 12/07    mild gastritis  . Atrial fibrillation     PAF  . COPD (chronic obstructive pulmonary disease)   . Chronic kidney disease     stage 3  . CAD S/P percutaneous coronary angioplasty 2000, 2002, 2006    Status post PCI of proximal LAD and RI in roughly 2000; DMS 3.0 mm x 15 mm to mid RCA (March of 2) - ISR of RCA stent in October 2002 --> Cutting Balloon PTCA; May 2006: Cypher 2.5 mm x13 mm to mid LAD  . CAD in native artery  2007  Cardiac catheterization showed patent stents in the mid RCA, mid RI, proximal and mid LAD; no ischemia on Myoview stress test  . Combined systolic and diastolic heart failure, NYHA class 2   . Ischemic cardiomyopathy February 2004    Echo : EF 35-40%, severe anterior wall hypokinesis with thinning c/w old prior LAD infarct; grade 2 diastolic dysfunction; severely dilated left atrium  . Non-STEMI (non-ST elevated myocardial infarction) 12/12/2012  . Stable angina     Stable; is not interested in invasive evaluation  . Anemia   . Neuromuscular disorder   . Hypertension   . Parkinson's disease   . Hyperlipidemia   . Anxiety     . Depression     History of  . GERD (gastroesophageal reflux disease)   . Hypothyroidism (acquired)     Status post partial thyroidectomy  . Arthritis   . History of pneumonia     Prior Cardiac Evaluation/Procedures Past Surgical History  Procedure Laterality Date  . Cardiac catheterization  1999    xomplex lesion in bifurfaction of LAD and 1st diagonal, stent to ramus was widely patent  . Cardiac catheterization  10/1998    stent to diagonal - repeat cath 8/200 showed 50% LAD instenotic lesion   . Cardiac catheterization  12/2000    PTCA to RCA  . Cardiac catheterization  10//2002    stent and cutting balloon to RCA- repeat cath 09/2002 showed stable coronary anatomy  . Cardiac catheterization  02/2005    2.5x49mm Cypher DES to LAD for "gap lesion" between 2 previous stents - repeat cath in 08/2005 showed patent stents   . Cardiac catheterization  05/2006    patent stents in RCA, LAD, ramus intermedius, normal LV function   . Transthoracic echocardiogram  12/2012    EF 35-40%, mod conc LVH. systolic function mod reduced, grade 2 diastolic dysfunction; AV sclerosis; calcified MV annulus; mild TR    Allergies  Allergen Reactions  . Atorvastatin     REACTION: Palpitations  . Carbidopa W-Levodopa Diarrhea and Nausea Only     vivid dreams  . Codeine Nausea Only  . Duloxetine     REACTION: Constipation  . Gabapentin Nausea Only    REACTION: Pruritic rash,  lightheadedness  . Penicillins   . Sulfamethoxazole-Trimethoprim Nausea Only  . Trimethoprim Other (See Comments)    Per mar  . Venlafaxine     REACTION: orthostasis    Current Outpatient Prescriptions  Medication Sig Dispense Refill  . acetaminophen (TYLENOL) 325 MG tablet Take 650 mg by mouth at bedtime.      Marland Kitchen aspirin (BABY ASPIRIN) 81 MG chewable tablet Chew 81 mg by mouth every morning.       . bisacodyl (DULCOLAX) 10 MG suppository Place 10 mg rectally every 3 (three) days.      . Calcium Carbonate-Vitamin D (OYSCO  500+D PO) Take 1 tablet by mouth daily.       . Cholecalciferol (VITAMIN D-3) 1000 UNITS CAPS Take 1 capsule by mouth daily.       . clopidogrel (PLAVIX) 75 MG tablet Take 75 mg by mouth every morning.       . diclofenac sodium (VOLTAREN) 1 % GEL Apply 2 g topically 2 (two) times daily as needed. To left shoulder      . docusate sodium (COLACE) 100 MG capsule Take 100 mg by mouth 2 (two) times daily.      . ferrous sulfate 325 (65 FE) MG tablet Take 325 mg by mouth every  morning.       . fish oil-omega-3 fatty acids 1000 MG capsule Take 1 g by mouth 2 (two) times daily.       . folic acid (FOLVITE) 400 MCG tablet Take 800 mcg by mouth every morning.      Marland Kitchen guaiFENesin (MUCINEX) 600 MG 12 hr tablet Take 600 mg by mouth 2 (two) times daily.      Marland Kitchen ipratropium-albuterol (DUONEB) 0.5-2.5 (3) MG/3ML SOLN Take 3 mLs by nebulization every 6 (six) hours as needed.      . isosorbide mononitrate (IMDUR) 60 MG 24 hr tablet Take 60 mg by mouth at bedtime.      . lansoprazole (PREVACID) 30 MG capsule Take 30 mg by mouth every morning.       Marland Kitchen levothyroxine (LEVOTHROID) 75 MCG tablet Take 1 tablet (75 mcg total) by mouth daily before breakfast.      . lidocaine (LIDODERM) 5 % Apply 1 patch to left knee at bedtime, wear 12 hours on and 12 hours off      . loratadine (CLARITIN) 10 MG tablet Take 10 mg by mouth every morning.      Marland Kitchen LORazepam (ATIVAN) 0.5 MG tablet Take 1/2 tablet by mouth every morning. Take 1 tablet by mouth every evening.      . metoprolol succinate (TOPROL-XL) 50 MG 24 hr tablet Take 50 mg by mouth daily. Take with or immediately following a meal.      . mirtazapine (REMERON) 7.5 MG tablet Take 7.5 mg by mouth at bedtime.      . nitroGLYCERIN (NITROSTAT) 0.4 MG SL tablet Place 0.4 mg under the tongue every 5 (five) minutes x 3 doses as needed. For chest pain.      Marland Kitchen oxyCODONE-acetaminophen (PERCOCET) 5-325 MG per tablet Take 1 tablet by mouth every 4 (four) hours as needed. pain      .  OXYGEN-HELIUM IN Inhale into the lungs as needed.       . polyethylene glycol (MIRALAX) powder Take 17 g by mouth every evening. For constipation      . polyvinyl alcohol (LIQUIFILM TEARS) 1.4 % ophthalmic solution Place 1 drop into both eyes 4 (four) times daily. For dry eyes       . potassium chloride SA (K-DUR,KLOR-CON) 20 MEQ tablet Take 20 mEq by mouth every evening.       . senna (SENOKOT) 8.6 MG TABS Take 1 tablet by mouth every other day.      . sertraline (ZOLOFT) 25 MG tablet Take 25 mg by mouth every morning.      . Tamsulosin HCl (FLOMAX) 0.4 MG CAPS Take 1 capsule (0.4 mg total) by mouth daily after breakfast.  30 capsule  0  . torsemide (DEMADEX) 20 MG tablet Take 40 mg by mouth 2 (two) times daily.      . traMADol (ULTRAM) 50 MG tablet Take 50 mg by mouth 3 (three) times daily.      . vitamin B-12 (CYANOCOBALAMIN) 1000 MCG tablet Take 1,000 mcg by mouth every morning.       Current Facility-Administered Medications  Medication Dose Route Frequency Provider Last Rate Last Dose  . cyanocobalamin ((VITAMIN B-12)) injection 1,000 mcg  1,000 mcg Intramuscular Q30 days Mayo Ao, NP        History   Social History Narrative   She is an resident at [facility] assisted living.   She is a widowed, mother of 4, grandmother 77 and great-grandmother 5.   She  is usually accompanied by either one of her daughters her daughter-in-law.  This current bottle is usually the one is the main family member looking after her.      She is much wheelchair-bound, but occasionally uses a wheeled walker.   She does not smoke   ROS: A comprehensive Review of Systems - Negative except Symptoms noted above, in the back and she is pretty much wheelchair-bound.  She is minimally active, and quite forgetful   PHYSICAL EXAM BP 150/82  Pulse 83  Ht 5\' 4"  (1.626 m)  Wt 138 lb 3.2 oz (62.687 kg)  BMI 23.71 kg/m2 General appearance: She is a pleasant, but somewhat cantankerous today elderly woman who  is in no acute distress, she is alert and related and answers questions appropriate when she feels like it.  She is alert and oriented x3.  She is well-groomed, but frail-appearing Neck: JVD - a few cm above sternal notch, no adenopathy, no carotid bruit and supple, symmetrical, trachea midline Lungs: dullness to percussion bibasilar, rales bibasilar and Right greater than left and Otherwise nonlabored.  The remainder the lung fields are CTA Heart: normal apical impulse, regular rate and rhythm, S1, S2 normal, no S3 or S4 and 1/6 crescendo-decrescendo SEM at RUSB radiating to the carotids.  No R./G. Abdomen: soft, non-tender; bowel sounds normal; no masses,  no organomegaly Extremities: edema No erythema or rash, no ulcers, gangrene or trophic changes and No significant lesions Pulses: mildly diminished in bilateral lower 40s. Neurologic: Grossly normal  [id number] today: Yes Rate: 83  , Rhythm:  Normal Sinus Rhythm, First-Degree AV Block; inferior and anterior MI, age undetermined;    Recent Labs:  none available   ASSESSMENT / PLAN: Ischemic cardiomyopathy, EF 35-40% with grade 2 diastlolic dysfunction by echo 12/17/12 She clearly has ischemic cardiomyopathy based on her echocardiogram results.  She is known PCI to the LAD, but I don't know his is chronicity of this.  But I will respect her wishes and not pursue any type of further evaluation invasively.  For that reason we will not pursue noninvasive ischemic evaluation to confirm or deny what we're thinking.  At age 85 she is not on invasive procedures done she does with medical therapy.  She also feels that she is taking way too many medications, and is tired of doing so.  She doesn't really want to take any new medications, especially for now taking 1 or 2 away.  I broached the issue, but decided against adding an ACE inhibitor/ARB at this time, however when I see her back if unable to take the medication is stopped I will probably start  one.  Chronic combined systolic and diastolic heart failure, NYHA class 2 At present I think she may be a little volume up.  She has lower rales in her lungs.  I do not remember it before.  This and the concern of her dyspnea symptoms, makes you want to increase her therapy for the next few days. My instructions are as follows: Increase toresmide  To 60 mg  Twice a day  Then 60 mg in the MORNING  AND 40 MG IN THE EVENING FOR 2 DAYS THEN BACK TO 40 MG  TWICE A DAY  I really one of the physician at the nursing facility or her primary care physician to listen to her lungs next week or so to see if there are still rales present.  CAD, RCA PCI '02, LAD/RI '06. Patent  '07 Is quite clearly she  is CAD.  She is currently on aspirin Plavix, at this point I'm not sure of the benefit of Plavix.  I see her back will probably consider stopping that.  I still at the ages stands to increase her risk of bleeding, and this far out from her stents are, I am not aware of any benefit beyond secondary prevention. She has a statin intolerance, and is reluctant to take any other lipid control medication.  I may get a get away with checking a lipid panel on her in the near future, but this would mostly be for risk stratification, because I am not sure what to do with.  Stable angina We have her on Imdur 60 mg daily.  I think we continue to push this up if she has more angina, I would be reluctant to add yet another medicine such as Ranexa.     No orders of the defined types were placed in this encounter.   Meds ordered this encounter  Medications  . DISCONTD: metoprolol succinate (TOPROL-XL) 25 MG 24 hr tablet    Sig: Take 25 mg by mouth every morning.  . bisacodyl (DULCOLAX) 10 MG suppository    Sig: Place 10 mg rectally every 3 (three) days.  Marland Kitchen lidocaine (LIDODERM) 5 %    Sig: Apply 1 patch to left knee at bedtime, wear 12 hours on and 12 hours off  . torsemide (DEMADEX) 20 MG tablet    Sig: Take 40 mg by  mouth 2 (two) times daily.  Marland Kitchen ipratropium-albuterol (DUONEB) 0.5-2.5 (3) MG/3ML SOLN    Sig: Take 3 mLs by nebulization every 6 (six) hours as needed.  . metoprolol succinate (TOPROL-XL) 50 MG 24 hr tablet    Sig: Take 50 mg by mouth daily. Take with or immediately following a meal.  . guaiFENesin (MUCINEX) 600 MG 12 hr tablet    Sig: Take 600 mg by mouth 2 (two) times daily.    Followup: 3 months  DAVID W. Herbie Baltimore, M.D., M.S. [facility] [address] [place], [place]  [address]  [phone] Pager # [phone]

## 2013-08-01 NOTE — Assessment & Plan Note (Addendum)
She clearly has ischemic cardiomyopathy based on her echocardiogram results.  She is known PCI to the LAD, but I don't know his is chronicity of this.  But I will respect her wishes and not pursue any type of further evaluation invasively.  For that reason we will not pursue noninvasive ischemic evaluation to confirm or deny what we're thinking.  At age 79 she is not on invasive procedures done she does with medical therapy.  She also feels that she is taking way too many medications, and is tired of doing so.  She doesn't really want to take any new medications, especially for now taking 1 or 2 away.  I broached the issue, but decided against adding an ACE inhibitor/ARB at this time, however when I see her back if unable to take the medication is stopped I will probably start one.

## 2013-08-01 NOTE — Assessment & Plan Note (Addendum)
Is quite clearly she is CAD.  She is currently on aspirin Plavix, at this point I'm not sure of the benefit of Plavix.  I see her back will probably consider stopping that.  I still at the ages stands to increase her risk of bleeding, and this far out from her stents are, I am not aware of any benefit beyond secondary prevention. She has a statin intolerance, and is reluctant to take any other lipid control medication.  I may get a get away with checking a lipid panel on her in the near future, but this would mostly be for risk stratification, because I am not sure what to do with.

## 2013-08-01 NOTE — Assessment & Plan Note (Addendum)
At present I think she may be a little volume up.  She has lower rales in her lungs.  I do not remember it before.  This and the concern of her dyspnea symptoms, makes you want to increase her therapy for the next few days. My instructions are as follows: Increase toresmide  To 60 mg  Twice a day  Then 60 mg in the MORNING  AND 40 MG IN THE EVENING FOR 2 DAYS THEN BACK TO 40 MG  TWICE A DAY  I really one of the physician at the nursing facility or her primary care physician to listen to her lungs next week or so to see if there are still rales present.

## 2013-08-01 NOTE — Assessment & Plan Note (Signed)
We have her on Imdur 60 mg daily.  I think we continue to push this up if she has more angina, I would be reluctant to add yet another medicine such as Ranexa.

## 2013-08-15 ENCOUNTER — Encounter: Payer: Self-pay | Admitting: Cardiology

## 2013-11-01 ENCOUNTER — Ambulatory Visit (INDEPENDENT_AMBULATORY_CARE_PROVIDER_SITE_OTHER): Payer: Medicare Other | Admitting: Cardiology

## 2013-11-01 ENCOUNTER — Encounter: Payer: Self-pay | Admitting: Cardiology

## 2013-11-01 VITALS — BP 136/72 | HR 88 | Wt 141.1 lb

## 2013-11-01 DIAGNOSIS — Z8679 Personal history of other diseases of the circulatory system: Secondary | ICD-10-CM

## 2013-11-01 DIAGNOSIS — I2510 Atherosclerotic heart disease of native coronary artery without angina pectoris: Secondary | ICD-10-CM

## 2013-11-01 DIAGNOSIS — I209 Angina pectoris, unspecified: Secondary | ICD-10-CM

## 2013-11-01 DIAGNOSIS — J069 Acute upper respiratory infection, unspecified: Secondary | ICD-10-CM

## 2013-11-01 DIAGNOSIS — I255 Ischemic cardiomyopathy: Secondary | ICD-10-CM

## 2013-11-01 DIAGNOSIS — I208 Other forms of angina pectoris: Secondary | ICD-10-CM

## 2013-11-01 DIAGNOSIS — I2589 Other forms of chronic ischemic heart disease: Secondary | ICD-10-CM

## 2013-11-01 DIAGNOSIS — I10 Essential (primary) hypertension: Secondary | ICD-10-CM

## 2013-11-01 DIAGNOSIS — E785 Hyperlipidemia, unspecified: Secondary | ICD-10-CM

## 2013-11-01 NOTE — Progress Notes (Signed)
PATIENT: [name] MRN: [id number]  DOB: 31-May-1917   DOV:11/03/2013 PCP: Wenda Low, MD  Clinic Note: Chief Complaint  Patient presents with  . 3 month visit    has a cough  no fever,no chest pain, no edema   HPI: [name] is a 80 y.o. female with a cardiac history noted below who presents today for six-month followup. She is very complicated elderly woman of 75, who has a long history of coronary disease dating back to 1999 is documented below.  Her last cardiac catheterization was in 2007 showing a widely patent stents.  I first met her in the hospital which is admitted for CHF exacerbation and was thought to be at acute on chronic diastolic and systolic heart failure.  She diuresed well with IV Lasix.  An echocardiogram performed showed moderately reduced EF of 35-40% with the anterior wall motion abnormality consistent with old prior LAD infarct.  I last saw her in October.  Interval History: She presents today doing relatively well for cardiac standpoint, with the only complaint being upper respiratory symptoms of cough and mild dyspnea. Does have some mild congestion as well. From a cardiac standpoint, she has a baseline exertional dyspnea but otherwise denies any chest tightness or pressure at rest with exertion. No PND, orthopnea or edema.  The remainder of Cardiovascular ROS: positive for - dyspnea on exertion negative for - chest pain, edema, irregular heartbeat, loss of consciousness, murmur, orthopnea, palpitations, paroxysmal nocturnal dyspnea, rapid heart rate or shortness of breath: Additional cardiac review of systems: Lightheadedness - no, dizziness - no, syncope/near-syncope - no; TIA/amaurosis fugax - no Melena - no, hematochezia no; hematuria - no; nosebleeds - no; claudication - no  Past Medical History Reviewed in Epic. Cardiovascular History  Diagnosis Date  . DVT of lower extremity (deep venous thrombosis) 7/200o  . COPD (chronic obstructive  pulmonary disease)   . Chronic kidney disease     stage 3  . CAD S/P percutaneous coronary angioplasty 2000, 2002, 2006    Status post PCI of proximal LAD and RI in roughly 2000; DMS 3.0 mm x 15 mm to mid RCA (March of 2) - ISR of RCA stent in October 2002 --> Cutting Balloon PTCA; May 2006: Cypher 2.5 mm x13 mm to mid LAD  . CAD in native artery  2007    Cardiac catheterization showed patent stents in the mid RCA, mid RI, proximal and mid LAD; no ischemia on Myoview stress test  . Combined systolic and diastolic heart failure, NYHA class 2   . Ischemic cardiomyopathy February 2004    Echo : EF 35-40%, severe anterior wall hypokinesis with thinning c/w old prior LAD infarct; grade 2 diastolic dysfunction; severely dilated left atrium  . Non-STEMI (non-ST elevated myocardial infarction) 12/12/2012  . Stable angina     Stable; is not interested in invasive evaluation  . Anemia   . Hyperlipidemia   . Anxiety    Prior Cardiac Evaluation/Procedures Past Surgical History  Procedure Laterality Date  . Cardiac catheterization  1999    xomplex lesion in bifurfaction of LAD and 1st diagonal, stent to ramus was widely patent  . Cardiac catheterization  10/1998    stent to diagonal - repeat cath 8/200 showed 50% LAD instenotic lesion   . Cardiac catheterization  12/2000    PTCA to RCA  . Cardiac catheterization  10//2002    stent and cutting balloon to RCA- repeat cath 09/2002 showed stable coronary anatomy  . Cardiac catheterization  02/2005    2.5x73m Cypher DES to LAD for "gap lesion" between 2 previous stents - repeat cath in 08/2005 showed patent stents   . Cardiac catheterization  05/2006    patent stents in RCA, LAD, ramus intermedius, normal LV function   . Transthoracic echocardiogram  12/2012    EF 35-40%, mod conc LVH. systolic function mod reduced, grade 2 diastolic dysfunction; AV sclerosis; calcified MV annulus; mild TR    Medications and Allergies Reviewed in Epic  History    Social History Narrative   [facility].   She is a widowed, mother of [phone] grandmother 174and great-grandmother 531   She is usually accompanied by either one of her daughters her daughter-in-law.    She is much wheelchair-bound, but occasionally uses a wheeled walker.   She does not smoke   _0    Long frank discussions with her and the family made it clear that she was against any further invasive evaluation.  Therefore, we did not pursue stress testing or cardiac catheterization at that time.   ROS: A comprehensive Review of Systems - Negative except Upper a store symptoms noted including cough that is nonproductive. No fevers or chills. Mild dyspnea associated with a cough along with nasal congestion.  PHYSICAL EXAM BP 136/72  Pulse 88  Wt 141 lb 1.6 oz (64.003 kg) General appearance: She is a pleasant, elderly woman who is in no acute distress, she is alert and related and answers questions appropriately.  She is alert and oriented x3.  She is well-groomed, but frail-appearing Neck: No real JVD, no adenopathy, no carotid bruit and supple, symmetrical, trachea midline Lungs: dullness to percussion bibasilar, persistent bibasilar and Right greater than left are also noted in upper lung fields today. . Otherwise nonlabored.   Heart: normal apical impulse, RRR, S1, S2 normal, no S3 or S4 and 1/6 crescendo-decrescendo SEM at RUSB-->carotids.  No R./G. Abdomen: soft, non-tender; bowel sounds normal; no masses,  no organomegaly Extremities: edema No erythema or rash, no ulcers, gangrene or trophic changes and No significant lesions Pulses: mildly diminished in bilateral lower 40s. Neurologic: Grossly normal  EZOX:WRUEAVWUJtoday:  no   Recent Labs:  none available   ASSESSMENT / PLAN: Stable angina Cc relatively well controlled on current dose of Imdur. Continue no changes. Followup in 6 months.  CAD, RCA PCI '02, LAD/RI '06. Patent  '07 Remain on  aspirin and Plavix as well as nitrate as described above. With no signs of bleeding, we'll continue Plavix for now. This could be stopped at any time. She is statin intolerant.  Ischemic cardiomyopathy, EF 35-40% with grade 2 diastlolic dysfunction by echo 12/17/12 No active heart layer symptoms. The lung sounds he has are chronic and are not related to pulmonary edema. She is on standing dose of torsemide. She is not on an ACE inhibitor or ARB, and in light of symptoms I am reluctant to start one.  She is reluctant to start any new medications.  HYPERLIPIDEMIA She is statin intolerant and blunting of the medications. She is resistant to checking lipids.  ATRIAL FIBRILLATION, PAROXYSMAL, HX OF No documented recurrence. No plans to anticoagulate beyond aspirin plus Plavix. I have not seen any documented A. fib.  HYPERTENSION, BENIGN SYSTEMIC  stable. No changes to medications.  Upper respiratory tract infection Distantly based on how frail she is with her significant COPD and pulmonary as well as cardiac history to, I would recommend that she is  a least evaluated by the primary team at her nursing facility.    No orders of the defined types were placed in this encounter.   No orders of the defined types were placed in this encounter.    Followup: 6 months  DAVID W. Ellyn Hack, M.D., M.S. [facility] [address] [place], [place]  [address]  [phone] Pager # [phone]

## 2013-11-01 NOTE — Patient Instructions (Signed)
No changes with your current  Medication   Recommend seeing the nursing home doctor for your cold.    Your physician wants you to follow-up in 6 months Dr Herbie BaltimoreHarding. You will receive a reminder letter in the mail two months in advance. If you don't receive a letter, please call our office to schedule the follow-up appointment.

## 2013-11-03 ENCOUNTER — Encounter: Payer: Self-pay | Admitting: Cardiology

## 2013-11-03 DIAGNOSIS — J069 Acute upper respiratory infection, unspecified: Secondary | ICD-10-CM | POA: Insufficient documentation

## 2013-11-03 NOTE — Assessment & Plan Note (Signed)
Cc relatively well controlled on current dose of Imdur. Continue no changes. Followup in 6 months.

## 2013-11-03 NOTE — Assessment & Plan Note (Signed)
Distantly based on how frail she is with her significant COPD and pulmonary as well as cardiac history to, I would recommend that she is a least evaluated by the primary team at her nursing facility.

## 2013-11-03 NOTE — Assessment & Plan Note (Signed)
stable. No changes to medications.

## 2013-11-03 NOTE — Assessment & Plan Note (Signed)
No documented recurrence. No plans to anticoagulate beyond aspirin plus Plavix. I have not seen any documented A. fib.

## 2013-11-03 NOTE — Assessment & Plan Note (Signed)
She is statin intolerant and blunting of the medications. She is resistant to checking lipids.

## 2013-11-03 NOTE — Assessment & Plan Note (Signed)
Remain on aspirin and Plavix as well as nitrate as described above. With no signs of bleeding, we'll continue Plavix for now. This could be stopped at any time. She is statin intolerant.

## 2013-11-03 NOTE — Assessment & Plan Note (Signed)
No active heart layer symptoms. The lung sounds he has are chronic and are not related to pulmonary edema. She is on standing dose of torsemide. She is not on an ACE inhibitor or ARB, and in light of symptoms I am reluctant to start one.  She is reluctant to start any new medications.

## 2014-01-13 ENCOUNTER — Inpatient Hospital Stay (HOSPITAL_COMMUNITY): Payer: PRIVATE HEALTH INSURANCE

## 2014-01-13 ENCOUNTER — Encounter (HOSPITAL_COMMUNITY): Payer: Self-pay | Admitting: Emergency Medicine

## 2014-01-13 ENCOUNTER — Inpatient Hospital Stay (HOSPITAL_COMMUNITY)
Admission: EM | Admit: 2014-01-13 | Discharge: 2014-01-15 | DRG: 065 | Disposition: A | Discharge status: Hospice | Payer: PRIVATE HEALTH INSURANCE | Attending: Internal Medicine | Admitting: Internal Medicine

## 2014-01-13 ENCOUNTER — Emergency Department (HOSPITAL_COMMUNITY): Payer: PRIVATE HEALTH INSURANCE

## 2014-01-13 DIAGNOSIS — I2589 Other forms of chronic ischemic heart disease: Secondary | ICD-10-CM | POA: Diagnosis present

## 2014-01-13 DIAGNOSIS — I2089 Other forms of angina pectoris: Secondary | ICD-10-CM

## 2014-01-13 DIAGNOSIS — N760 Acute vaginitis: Secondary | ICD-10-CM

## 2014-01-13 DIAGNOSIS — I5032 Chronic diastolic (congestive) heart failure: Secondary | ICD-10-CM

## 2014-01-13 DIAGNOSIS — Z86718 Personal history of other venous thrombosis and embolism: Secondary | ICD-10-CM

## 2014-01-13 DIAGNOSIS — Z87891 Personal history of nicotine dependence: Secondary | ICD-10-CM

## 2014-01-13 DIAGNOSIS — I509 Heart failure, unspecified: Secondary | ICD-10-CM | POA: Diagnosis present

## 2014-01-13 DIAGNOSIS — I4891 Unspecified atrial fibrillation: Secondary | ICD-10-CM

## 2014-01-13 DIAGNOSIS — M2010 Hallux valgus (acquired), unspecified foot: Secondary | ICD-10-CM

## 2014-01-13 DIAGNOSIS — M545 Low back pain, unspecified: Secondary | ICD-10-CM

## 2014-01-13 DIAGNOSIS — G20 Parkinson's disease: Secondary | ICD-10-CM

## 2014-01-13 DIAGNOSIS — F3289 Other specified depressive episodes: Secondary | ICD-10-CM | POA: Diagnosis present

## 2014-01-13 DIAGNOSIS — M48061 Spinal stenosis, lumbar region without neurogenic claudication: Secondary | ICD-10-CM

## 2014-01-13 DIAGNOSIS — I10 Essential (primary) hypertension: Secondary | ICD-10-CM

## 2014-01-13 DIAGNOSIS — G609 Hereditary and idiopathic neuropathy, unspecified: Secondary | ICD-10-CM

## 2014-01-13 DIAGNOSIS — K219 Gastro-esophageal reflux disease without esophagitis: Secondary | ICD-10-CM

## 2014-01-13 DIAGNOSIS — I6350 Cerebral infarction due to unspecified occlusion or stenosis of unspecified cerebral artery: Principal | ICD-10-CM

## 2014-01-13 DIAGNOSIS — H8110 Benign paroxysmal vertigo, unspecified ear: Secondary | ICD-10-CM

## 2014-01-13 DIAGNOSIS — I208 Other forms of angina pectoris: Secondary | ICD-10-CM

## 2014-01-13 DIAGNOSIS — G20A1 Parkinson's disease without dyskinesia, without mention of fluctuations: Secondary | ICD-10-CM

## 2014-01-13 DIAGNOSIS — I872 Venous insufficiency (chronic) (peripheral): Secondary | ICD-10-CM

## 2014-01-13 DIAGNOSIS — J9600 Acute respiratory failure, unspecified whether with hypoxia or hypercapnia: Secondary | ICD-10-CM

## 2014-01-13 DIAGNOSIS — Z9861 Coronary angioplasty status: Secondary | ICD-10-CM

## 2014-01-13 DIAGNOSIS — J449 Chronic obstructive pulmonary disease, unspecified: Secondary | ICD-10-CM | POA: Diagnosis present

## 2014-01-13 DIAGNOSIS — E039 Hypothyroidism, unspecified: Secondary | ICD-10-CM

## 2014-01-13 DIAGNOSIS — Z66 Do not resuscitate: Secondary | ICD-10-CM | POA: Diagnosis present

## 2014-01-13 DIAGNOSIS — J4489 Other specified chronic obstructive pulmonary disease: Secondary | ICD-10-CM

## 2014-01-13 DIAGNOSIS — F329 Major depressive disorder, single episode, unspecified: Secondary | ICD-10-CM | POA: Diagnosis present

## 2014-01-13 DIAGNOSIS — Z8679 Personal history of other diseases of the circulatory system: Secondary | ICD-10-CM

## 2014-01-13 DIAGNOSIS — N179 Acute kidney failure, unspecified: Secondary | ICD-10-CM | POA: Diagnosis present

## 2014-01-13 DIAGNOSIS — I129 Hypertensive chronic kidney disease with stage 1 through stage 4 chronic kidney disease, or unspecified chronic kidney disease: Secondary | ICD-10-CM | POA: Diagnosis present

## 2014-01-13 DIAGNOSIS — Z79899 Other long term (current) drug therapy: Secondary | ICD-10-CM

## 2014-01-13 DIAGNOSIS — E785 Hyperlipidemia, unspecified: Secondary | ICD-10-CM

## 2014-01-13 DIAGNOSIS — N183 Chronic kidney disease, stage 3 unspecified: Secondary | ICD-10-CM

## 2014-01-13 DIAGNOSIS — I255 Ischemic cardiomyopathy: Secondary | ICD-10-CM

## 2014-01-13 DIAGNOSIS — F411 Generalized anxiety disorder: Secondary | ICD-10-CM

## 2014-01-13 DIAGNOSIS — G8190 Hemiplegia, unspecified affecting unspecified side: Secondary | ICD-10-CM | POA: Diagnosis present

## 2014-01-13 DIAGNOSIS — M479 Spondylosis, unspecified: Secondary | ICD-10-CM

## 2014-01-13 DIAGNOSIS — I639 Cerebral infarction, unspecified: Secondary | ICD-10-CM

## 2014-01-13 DIAGNOSIS — M810 Age-related osteoporosis without current pathological fracture: Secondary | ICD-10-CM

## 2014-01-13 DIAGNOSIS — H905 Unspecified sensorineural hearing loss: Secondary | ICD-10-CM

## 2014-01-13 DIAGNOSIS — R29810 Facial weakness: Secondary | ICD-10-CM | POA: Diagnosis present

## 2014-01-13 DIAGNOSIS — I2510 Atherosclerotic heart disease of native coronary artery without angina pectoris: Secondary | ICD-10-CM

## 2014-01-13 DIAGNOSIS — Z6826 Body mass index (BMI) 26.0-26.9, adult: Secondary | ICD-10-CM

## 2014-01-13 DIAGNOSIS — R4789 Other speech disturbances: Secondary | ICD-10-CM | POA: Diagnosis present

## 2014-01-13 DIAGNOSIS — IMO0001 Reserved for inherently not codable concepts without codable children: Secondary | ICD-10-CM

## 2014-01-13 DIAGNOSIS — D510 Vitamin B12 deficiency anemia due to intrinsic factor deficiency: Secondary | ICD-10-CM

## 2014-01-13 DIAGNOSIS — E049 Nontoxic goiter, unspecified: Secondary | ICD-10-CM

## 2014-01-13 DIAGNOSIS — I252 Old myocardial infarction: Secondary | ICD-10-CM

## 2014-01-13 DIAGNOSIS — Z515 Encounter for palliative care: Secondary | ICD-10-CM

## 2014-01-13 DIAGNOSIS — R269 Unspecified abnormalities of gait and mobility: Secondary | ICD-10-CM

## 2014-01-13 DIAGNOSIS — Z7902 Long term (current) use of antithrombotics/antiplatelets: Secondary | ICD-10-CM

## 2014-01-13 DIAGNOSIS — R64 Cachexia: Secondary | ICD-10-CM | POA: Diagnosis present

## 2014-01-13 DIAGNOSIS — N3941 Urge incontinence: Secondary | ICD-10-CM

## 2014-01-13 DIAGNOSIS — J069 Acute upper respiratory infection, unspecified: Secondary | ICD-10-CM

## 2014-01-13 DIAGNOSIS — I5042 Chronic combined systolic (congestive) and diastolic (congestive) heart failure: Secondary | ICD-10-CM

## 2014-01-13 HISTORY — DX: Cerebral infarction, unspecified: I63.9

## 2014-01-13 LAB — DIFFERENTIAL
Basophils Absolute: 0 10*3/uL (ref 0.0–0.1)
Basophils Relative: 0 % (ref 0–1)
EOS ABS: 0.4 10*3/uL (ref 0.0–0.7)
EOS PCT: 4 % (ref 0–5)
Lymphocytes Relative: 20 % (ref 12–46)
Lymphs Abs: 2.1 10*3/uL (ref 0.7–4.0)
Monocytes Absolute: 0.7 10*3/uL (ref 0.1–1.0)
Monocytes Relative: 6 % (ref 3–12)
NEUTROS PCT: 70 % (ref 43–77)
Neutro Abs: 7.2 10*3/uL (ref 1.7–7.7)

## 2014-01-13 LAB — CBC
HCT: 34 % — ABNORMAL LOW (ref 36.0–46.0)
Hemoglobin: 11.1 g/dL — ABNORMAL LOW (ref 12.0–15.0)
MCH: 31.1 pg (ref 26.0–34.0)
MCHC: 32.6 g/dL (ref 30.0–36.0)
MCV: 95.2 fL (ref 78.0–100.0)
PLATELETS: 253 10*3/uL (ref 150–400)
RBC: 3.57 MIL/uL — ABNORMAL LOW (ref 3.87–5.11)
RDW: 14.5 % (ref 11.5–15.5)
WBC: 10.3 10*3/uL (ref 4.0–10.5)

## 2014-01-13 LAB — I-STAT TROPONIN, ED: Troponin i, poc: 0.01 ng/mL (ref 0.00–0.08)

## 2014-01-13 LAB — COMPREHENSIVE METABOLIC PANEL
ALBUMIN: 3.8 g/dL (ref 3.5–5.2)
ALK PHOS: 85 U/L (ref 39–117)
ALT: 10 U/L (ref 0–35)
AST: 18 U/L (ref 0–37)
BUN: 32 mg/dL — ABNORMAL HIGH (ref 6–23)
CALCIUM: 10.1 mg/dL (ref 8.4–10.5)
CO2: 28 mEq/L (ref 19–32)
Chloride: 96 mEq/L (ref 96–112)
Creatinine, Ser: 1.53 mg/dL — ABNORMAL HIGH (ref 0.50–1.10)
GFR calc Af Amer: 32 mL/min — ABNORMAL LOW (ref >90)
GFR calc non Af Amer: 28 mL/min — ABNORMAL LOW (ref >90)
Glucose, Bld: 123 mg/dL — ABNORMAL HIGH (ref 70–99)
POTASSIUM: 4.4 meq/L (ref 3.7–5.3)
SODIUM: 140 meq/L (ref 137–147)
Total Bilirubin: 0.4 mg/dL (ref 0.3–1.2)
Total Protein: 7.5 g/dL (ref 6.0–8.3)

## 2014-01-13 LAB — PROTIME-INR
INR: 1.1 (ref 0.00–1.49)
PROTHROMBIN TIME: 14 s (ref 11.6–15.2)

## 2014-01-13 LAB — CBG MONITORING, ED: GLUCOSE-CAPILLARY: 117 mg/dL — AB (ref 70–99)

## 2014-01-13 MED ORDER — FOLIC ACID 1 MG PO TABS
800.0000 ug | ORAL_TABLET | Freq: Every morning | ORAL | Status: DC
  Filled 2014-01-13: qty 1

## 2014-01-13 MED ORDER — SERTRALINE HCL 25 MG PO TABS
25.0000 mg | ORAL_TABLET | Freq: Every morning | ORAL | Status: DC
  Filled 2014-01-13: qty 1

## 2014-01-13 MED ORDER — ASPIRIN 325 MG PO TABS
325.0000 mg | ORAL_TABLET | Freq: Every day | ORAL | Status: DC
  Filled 2014-01-13: qty 1

## 2014-01-13 MED ORDER — POLYETHYLENE GLYCOL 3350 17 GM/SCOOP PO POWD
17.0000 g | Freq: Every evening | ORAL | Status: DC
  Filled 2014-01-13: qty 255

## 2014-01-13 MED ORDER — MIRTAZAPINE 7.5 MG PO TABS
7.5000 mg | ORAL_TABLET | Freq: Every day | ORAL | Status: DC
  Filled 2014-01-13 (×2): qty 1

## 2014-01-13 MED ORDER — FERROUS SULFATE 325 (65 FE) MG PO TABS
325.0000 mg | ORAL_TABLET | Freq: Every morning | ORAL | Status: DC
  Filled 2014-01-13: qty 1

## 2014-01-13 MED ORDER — NITROGLYCERIN 0.4 MG SL SUBL: 0.4000 mg | SUBLINGUAL_TABLET | SUBLINGUAL | Status: DC | PRN

## 2014-01-13 MED ORDER — LEVOTHYROXINE SODIUM 100 MCG IV SOLR
37.5000 ug | Freq: Every day | INTRAVENOUS | Status: DC
  Administered 2014-01-14: 37.5 ug via INTRAVENOUS
  Filled 2014-01-13: qty 5

## 2014-01-13 MED ORDER — POLYETHYLENE GLYCOL 3350 17 G PO PACK
17.0000 g | PACK | Freq: Every day | ORAL | Status: DC
  Filled 2014-01-13 (×2): qty 1

## 2014-01-13 MED ORDER — TAMSULOSIN HCL 0.4 MG PO CAPS
0.4000 mg | ORAL_CAPSULE | Freq: Every day | ORAL | Status: DC
  Filled 2014-01-13 (×2): qty 1

## 2014-01-13 MED ORDER — DILTIAZEM HCL 25 MG/5ML IV SOLN
15.0000 mg | Freq: Once | INTRAVENOUS | Status: AC
  Administered 2014-01-13: 15 mg via INTRAVENOUS
  Filled 2014-01-13: qty 5

## 2014-01-13 MED ORDER — ASPIRIN 300 MG RE SUPP
300.0000 mg | Freq: Every day | RECTAL | Status: DC
Start: 2014-01-14 — End: 2014-01-14
  Administered 2014-01-14: 300 mg via RECTAL
  Filled 2014-01-13: qty 1

## 2014-01-13 MED ORDER — CYANOCOBALAMIN 1000 MCG/ML IJ SOLN: 1000.0000 ug | INTRAMUSCULAR | Status: DC

## 2014-01-13 MED ORDER — ISOSORBIDE MONONITRATE ER 60 MG PO TB24
60.0000 mg | ORAL_TABLET | Freq: Every day | ORAL | Status: DC
  Filled 2014-01-13 (×2): qty 1

## 2014-01-13 MED ORDER — SENNOSIDES-DOCUSATE SODIUM 8.6-50 MG PO TABS
1.0000 | ORAL_TABLET | Freq: Every evening | ORAL | Status: DC | PRN
  Filled 2014-01-13: qty 1

## 2014-01-13 MED ORDER — DOCUSATE SODIUM 100 MG PO CAPS: 100.0000 mg | ORAL_CAPSULE | Freq: Two times a day (BID) | ORAL | Status: DC

## 2014-01-13 MED ORDER — DILTIAZEM HCL 25 MG/5ML IV SOLN: 15.0000 mg | Freq: Once | INTRAVENOUS | Status: DC

## 2014-01-13 MED ORDER — DILTIAZEM HCL 100 MG IV SOLR
5.0000 mg/h | INTRAVENOUS | Status: DC
  Administered 2014-01-14: 15 mg/h via INTRAVENOUS
  Filled 2014-01-13: qty 100

## 2014-01-13 MED ORDER — DICLOFENAC SODIUM 1 % TD GEL: 2.0000 g | Freq: Two times a day (BID) | TRANSDERMAL | Status: DC | PRN

## 2014-01-13 MED ORDER — LIDOCAINE 5 % EX PTCH
1.0000 | MEDICATED_PATCH | CUTANEOUS | Status: DC
  Filled 2014-01-13 (×2): qty 1

## 2014-01-13 MED ORDER — ENOXAPARIN SODIUM 30 MG/0.3ML ~~LOC~~ SOLN
30.0000 mg | SUBCUTANEOUS | Status: DC
  Administered 2014-01-14: 30 mg via SUBCUTANEOUS
  Filled 2014-01-13: qty 0.3

## 2014-01-13 MED ORDER — LORATADINE 10 MG PO TABS
10.0000 mg | ORAL_TABLET | Freq: Every morning | ORAL | Status: DC
  Filled 2014-01-13: qty 1

## 2014-01-13 MED ORDER — OMEGA-3-ACID ETHYL ESTERS 1 G PO CAPS
1.0000 g | ORAL_CAPSULE | Freq: Every day | ORAL | Status: DC
  Filled 2014-01-13: qty 1

## 2014-01-13 MED ORDER — IPRATROPIUM-ALBUTEROL 0.5-2.5 (3) MG/3ML IN SOLN
3.0000 mL | Freq: Four times a day (QID) | RESPIRATORY_TRACT | Status: DC | PRN
Start: 2014-01-13 — End: 2014-01-14

## 2014-01-13 MED ORDER — DILTIAZEM HCL 100 MG IV SOLR
5.0000 mg/h | Freq: Once | INTRAVENOUS | Status: AC
  Administered 2014-01-13: 5 mg/h via INTRAVENOUS

## 2014-01-13 MED ORDER — OMEGA-3 FATTY ACIDS 1000 MG PO CAPS: 1.0000 g | ORAL_CAPSULE | Freq: Two times a day (BID) | ORAL | Status: DC

## 2014-01-13 MED ORDER — DILTIAZEM HCL 100 MG IV SOLR
5.0000 mg/h | Freq: Once | INTRAVENOUS | Status: AC
  Administered 2014-01-13: 15 mg/h via INTRAVENOUS
  Filled 2014-01-13: qty 100

## 2014-01-13 NOTE — ED Notes (Signed)
Resident has spoke with family

## 2014-01-13 NOTE — ED Notes (Signed)
Son is at bedside

## 2014-01-13 NOTE — ED Notes (Signed)
Pt was from [facility] and last seen normal around 1500 per staff.  Pt usually ambulates with assistance, carries on conversation, and follows commands.  Staff noted Left sided weakness.  Pt responded to EMS with stimulation to foot, not following some commands.  Pt has left sided facial droop, decreased movement of left arm, slurred speech.  Initial NIH was 14 per stroke team.  Pt has DNR

## 2014-01-13 NOTE — ED Provider Notes (Signed)
CSN: [id number]     Arrival date & time 01/13/14  1755 History   First MD Initiated Contact with Patient 01/13/14 1758     Chief Complaint  Patient presents with  . Code Stroke     (Consider location/radiation/quality/duration/timing/severity/associated sxs/prior Treatment) HPI Comments: 80 y.o. WF from SNF w/ stroke. Pt last seen normal between 2-3 hours ago, has hx of A fib, COPD, CKD, CAD, Parkinsons, HTN. Normally able to fully ambulate and communicate but now aphasic, and has preference with R side with L facial droop. Pt cannot give history. Resident of nursing home.  Patient is a 80 y.o. female presenting with Acute Neurological Problem. The history is provided by the EMS personnel. The history is limited by the condition of the patient.  Cerebrovascular Accident This is a new problem. The current episode started today. The problem occurs constantly. The problem has been unchanged. Associated symptoms include weakness. Pertinent negatives include no abdominal pain, coughing, diaphoresis or fever. Nothing aggravates the symptoms. She has tried nothing for the symptoms. The treatment provided no relief.    Past Medical History  Diagnosis Date  . Congenital anisocoria   . Hallux valgus   . Lung nodule 1999    stable on followup CT  . DVT of lower extremity (deep venous thrombosis) 7/200o  . 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 Lumbar stenosis 2001  . 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 S/P endoscopy 12/07    mild gastritis  . Atrial fibrillation     PAF  . COPD (chronic obstructive pulmonary disease)   . Chronic kidney disease     stage 3  . CAD S/P percutaneous coronary angioplasty 2000, 2002, 2006    Status post PCI of proximal LAD and RI in roughly 2000; DMS 3.0 mm x 15 mm to mid RCA (March of 2) - ISR of RCA stent in October 2002 --> Cutting Balloon PTCA; May 2006: Cypher 2.5 mm x13 mm to mid LAD  . CAD in native artery  2007    Cardiac catheterization showed  patent stents in the mid RCA, mid RI, proximal and mid LAD; no ischemia on Myoview stress test  . Combined systolic and diastolic heart failure, NYHA class 2   . Ischemic cardiomyopathy February 2004    Echo : EF 35-40%, severe anterior wall hypokinesis with thinning c/w old prior LAD infarct; grade 2 diastolic dysfunction; severely dilated left atrium  . Non-STEMI (non-ST elevated myocardial infarction) 12/12/2012  . Stable angina     Stable; is not interested in invasive evaluation  . Anemia   . Neuromuscular disorder   . Hypertension   . Parkinson's disease   . Hyperlipidemia   . Anxiety   . Depression     History of  . GERD (gastroesophageal reflux disease)   . Hypothyroidism (acquired)     Status post partial thyroidectomy  . Arthritis   . History of pneumonia   . Stroke 01/13/2014   Past Surgical History  Procedure Laterality Date  . Thyroidectomy, partial  1933  . Appendectomy    . Cervical discectomy    . Cholecystectomy    . Rotator cuff repair    . Orif hip fracture  2001  . Splenectomy    . Femur im nail  10/09/2011    Procedure: INTRAMEDULLARY (IM) NAIL FEMORAL;  Surgeon: Kathryne Hitch;  Location: [facility] OR;  Service: Orthopedics;  Laterality: Left;  . Cardiac catheterization  1999  xomplex lesion in bifurfaction of LAD and 1st diagonal, stent to ramus was widely patent  . Cardiac catheterization  10/1998    stent to diagonal - repeat cath 8/200 showed 50% LAD instenotic lesion   . Cardiac catheterization  12/2000    PTCA to RCA  . Cardiac catheterization  10//2002    stent and cutting balloon to RCA- repeat cath 09/2002 showed stable coronary anatomy  . Cardiac catheterization  02/2005    2.5x36mm Cypher DES to LAD for "gap lesion" between 2 previous stents - repeat cath in 08/2005 showed patent stents   . Cardiac catheterization  05/2006    patent stents in RCA, LAD, ramus intermedius, normal LV function   . Transthoracic echocardiogram  12/2012    EF  35-40%, mod conc LVH. systolic function mod reduced, grade 2 diastolic dysfunction; AV sclerosis; calcified MV annulus; mild TR   History reviewed. No pertinent family history. History  Substance Use Topics  . Smoking status: Former Smoker    Quit date: 10/08/1971  . Smokeless tobacco: Current User    Types: Chew  . Alcohol Use: Not on file   OB History   Grav Para Term Preterm Abortions TAB SAB Ect Mult Living                 Review of Systems  Unable to perform ROS: Mental status change  Constitutional: Negative for fever and diaphoresis.  Respiratory: Negative for cough.   Gastrointestinal: Negative for abdominal pain.  Neurological: Positive for weakness.      Allergies  Ace inhibitors; Atorvastatin; Carbidopa w-levodopa; Codeine; Duloxetine; Gabapentin; Penicillins; Sulfamethoxazole-trimethoprim; Trimethoprim; and Venlafaxine  Home Medications   Current Outpatient Rx  Name  Route  Sig  Dispense  Refill  . acetaminophen (TYLENOL) 325 MG tablet   Oral   Take 650 mg by mouth at bedtime.         Marland Kitchen aspirin (BABY ASPIRIN) 81 MG chewable tablet   Oral   Chew 81 mg by mouth every morning.          . Calcium Carbonate-Vitamin D (OYSCO 500+D PO)   Oral   Take 1 tablet by mouth daily.          . Cholecalciferol (VITAMIN D-3) 1000 UNITS CAPS   Oral   Take 1 capsule by mouth daily.          . clopidogrel (PLAVIX) 75 MG tablet   Oral   Take 75 mg by mouth every morning.          . diclofenac sodium (VOLTAREN) 1 % GEL   Topical   Apply 2 g topically 2 (two) times daily as needed. To left shoulder         . docusate sodium (COLACE) 100 MG capsule   Oral   Take 100 mg by mouth 2 (two) times daily.         . ferrous sulfate 325 (65 FE) MG tablet   Oral   Take 325 mg by mouth every morning.          . fish oil-omega-3 fatty acids 1000 MG capsule   Oral   Take 1 g by mouth 2 (two) times daily.          . folic acid (FOLVITE) 400 MCG tablet    Oral   Take 800 mcg by mouth every morning.         Marland Kitchen guaiFENesin (MUCINEX) 600 MG 12 hr tablet   Oral   Take  600 mg by mouth 2 (two) times daily.         Marland Kitchen ipratropium-albuterol (DUONEB) 0.5-2.5 (3) MG/3ML SOLN   Nebulization   Take 3 mLs by nebulization every 6 (six) hours as needed.         . isosorbide mononitrate (IMDUR) 60 MG 24 hr tablet   Oral   Take 60 mg by mouth at bedtime.         . lansoprazole (PREVACID) 30 MG capsule   Oral   Take 30 mg by mouth every morning.          Marland Kitchen levothyroxine (LEVOTHROID) 75 MCG tablet   Oral   Take 1 tablet (75 mcg total) by mouth daily before breakfast.         . lidocaine (LIDODERM) 5 %      Apply 1 patch to left knee at bedtime, wear 12 hours on and 12 hours off         . loratadine (CLARITIN) 10 MG tablet   Oral   Take 10 mg by mouth every morning.         Marland Kitchen LORazepam (ATIVAN) 0.5 MG tablet      Take 1/2 tablet by mouth every morning. Take 1 tablet by mouth every evening.         . metoprolol succinate (TOPROL-XL) 50 MG 24 hr tablet   Oral   Take 50 mg by mouth daily. Take with or immediately following a meal.         . mirtazapine (REMERON) 7.5 MG tablet   Oral   Take 7.5 mg by mouth at bedtime.         . nitroGLYCERIN (NITROSTAT) 0.4 MG SL tablet   Sublingual   Place 0.4 mg under the tongue every 5 (five) minutes x 3 doses as needed. For chest pain.         Marland Kitchen omega-3 acid ethyl esters (LOVAZA) 1 G capsule   Oral   Take 1 g by mouth 2 (two) times daily.         Marland Kitchen oxyCODONE-acetaminophen (PERCOCET) 5-325 MG per tablet   Oral   Take 1 tablet by mouth every 4 (four) hours as needed. pain         . OXYGEN-HELIUM IN   Inhalation   Inhale into the lungs as needed.          . polyethylene glycol (MIRALAX) powder   Oral   Take 17 g by mouth every evening. For constipation         . polyvinyl alcohol (LIQUIFILM TEARS) 1.4 % ophthalmic solution   Both Eyes   Place 1 drop into both  eyes 4 (four) times daily. For dry eyes          . potassium chloride SA (K-DUR,KLOR-CON) 20 MEQ tablet   Oral   Take 20 mEq by mouth every evening.          . promethazine (PHENERGAN) 25 MG tablet   Oral   Take 25 mg by mouth every 6 (six) hours as needed for nausea or vomiting.         . sennosides-docusate sodium (SENOKOT-S) 8.6-50 MG tablet   Oral   Take 1 tablet by mouth daily.         . sertraline (ZOLOFT) 25 MG tablet   Oral   Take 25 mg by mouth every morning.         . Tamsulosin HCl (FLOMAX) 0.4 MG CAPS  Oral   Take 1 capsule (0.4 mg total) by mouth daily after breakfast.   30 capsule   0   . torsemide (DEMADEX) 20 MG tablet   Oral   Take 40 mg by mouth 2 (two) times daily.         . traMADol (ULTRAM) 50 MG tablet   Oral   Take 50 mg by mouth 3 (three) times daily.          BP 115/75  Pulse 53  Temp(Src) 98 F (36.7 C) (Oral)  Resp 17  Wt 128 lb (58.06 kg)  SpO2 91% Physical Exam  Constitutional: She appears well-developed and well-nourished. She appears distressed.  HENT:  Head: Normocephalic and atraumatic.  Mouth/Throat: Oropharynx is clear and moist.  No signs of head trauma  Stares to R side, has L hemi neglect  Eyes: Conjunctivae are normal. Pupils are equal, round, and reactive to light. Right eye exhibits no discharge. Left eye exhibits no discharge. No scleral icterus.  Cannot test EOMs d/t AMS  Neck: Normal range of motion. Neck supple.  Cardiovascular: Intact distal pulses.  Exam reveals no gallop and no friction rub.   No murmur heard. irreg irreg tachy  Pulmonary/Chest: Effort normal and breath sounds normal. No respiratory distress. She has no wheezes. She has no rales.  Abdominal: Soft. She exhibits no distension and no mass. There is no tenderness (no pain response).  Musculoskeletal: Normal range of motion.  Neurological: She is alert. No cranial nerve deficit. She exhibits normal muscle tone. Coordination normal.   Awake and alert, does not follow commands, does not speak but does have some non meaningful phonation 5/5 strength RUE, 1/5 LUE Does not coopearte with LE testing, no obvious sensory deficit Aphasic, and does not appear to understand. Cannot test F2N d/t AMS 2+ reflexes b/l L facial droop  Skin: Skin is warm. No rash noted. She is not diaphoretic.    ED Course  Procedures (including critical care time) Labs Review Labs Reviewed  CBC - Abnormal; Notable for the following:    RBC 3.57 (*)    Hemoglobin 11.1 (*)    HCT 34.0 (*)    All other components within normal limits  COMPREHENSIVE METABOLIC PANEL - Abnormal; Notable for the following:    Glucose, Bld 123 (*)    BUN 32 (*)    Creatinine, Ser 1.53 (*)    GFR calc non Af Amer 28 (*)    GFR calc Af Amer 32 (*)    All other components within normal limits  CBG MONITORING, ED - Abnormal; Notable for the following:    Glucose-Capillary 117 (*)    All other components within normal limits  PROTIME-INR  DIFFERENTIAL  I-STAT TROPOININ, ED   Imaging Review Ct Head Wo Contrast  01/13/2014   CLINICAL DATA:  Code stroke.  Confusion.  EXAM: CT HEAD WITHOUT CONTRAST  TECHNIQUE: Contiguous axial images were obtained from the base of the skull through the vertex without intravenous contrast.  COMPARISON:  12/25/2011  FINDINGS: There is no evidence of acute cortical infarct, intracranial hemorrhage, mass, midline shift, or extra-axial fluid collection. Moderate cerebral atrophy is unchanged. Periventricular white matter hypodensities do not appear significantly changed and are compatible with moderate chronic small vessel ischemic disease.  Prior bilateral cataract surgery is noted. The mastoid air cells are clear. Mild mucosal thickening is noted in the left maxillary sinus and inferior left frontal sinus. There may be a small amount of fluid in the left  maxillary sinus.  IMPRESSION: 1. No evidence of acute intracranial abnormality. 2. Unchanged  cerebral atrophy and chronic small vessel ischemic disease. These results were called by telephone at the time of interpretation on 01/13/2014 at 6:17 PM to Dr. Anitra Lauth, who verbally acknowledged these results.   Electronically Signed   By: Sebastian Ache   On: 01/13/2014 18:18   Dg Chest Port 1 View  01/13/2014   CLINICAL DATA:  Shortness of breath and cough.  EXAM: PORTABLE CHEST - 1 VIEW  COMPARISON:  DG CHEST 2 VIEW dated 12/17/2012  FINDINGS: Cardiomegaly with pulmonary vascular prominence and interstitial prominence with Kerley B-lines noted. Small right pleural effusion present. These findings have progressed from prior exam. These findings are consistent with congestive heart failure. No acute osseous abnormality.  IMPRESSION: Congestive heart failure with progressive pulmonary interstitial edema. Superimposed interstitial pneumonitis cannot be excluded.   Electronically Signed   By: Maisie Fus  Register   On: 01/13/2014 23:06     EKG Interpretation None      MDM   80 y.o. WF w/ PMHx of Afib, COPD, CAD, Previous CVA w/ cc: of stroke. Pt lives at [facility], is demented but walks and communicates at baseline. 2-3 hours ago found to be aphasic, L facial droop, L hemineglect. Transported here as Code Stroke. Protecting airway, neuro to assess. Pt with exam as above. Irreg irreg tachy, as hx of afib. Pt has paperwork from SNF stating that if she had a CVA to not transport her to the ER. Family arrived 30 minutes after patient did and I discussed this with them and they wish for everything to be done except for her DNR/DNI status. Pt does not have capacity to make decisions so this was discussed with HCPOA her daughter who was at bedside. Pt had CT without bleed. Labs as above. She was given initially a bolus of Dilt, then another bolus and drip for good rate control. D/w neurologist whos tates she is not a candidate for any aggressive intervention. Recommend mediicne admission. D/w medicine who will admit to Step  Down. Pt remained HDS while in ED. Mental status unchanged. Admit. Care of case d/w my attending.  Final diagnoses:  Stroke  Atrial fibrillation with RVR    Admit    Pilar Jarvis, MD 01/13/14 2315

## 2014-01-13 NOTE — Consult Note (Addendum)
Referring Physician: Anitra Lauth    Chief Complaint: Left sided weakness  HPI: [name] is an 80 y.o. female who is a SNF resident.  Patient was normal until this after noon when found by staff to have left sided weakness and not be responding appropriately.  EMS was called at that time and the patient was brought in as a code stroke.  Initial NIHSS of 14.   Review of records from SNF show family has requested patient not to be brought to the hospital if she has stroke symptoms but to have therapy at the facility.    Date last known well: Date: 01/13/2014 Time last known well: Time: 15:00 tPA Given: No: SNF documents express family wishes for patient to not have aggressive treatment for stroke-only therapy  Past Medical History  Diagnosis Date  . Congenital anisocoria   . Hallux valgus   . Lung nodule 1999    stable on followup CT  . DVT of lower extremity (deep venous thrombosis) 7/200o  . 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 Lumbar stenosis 2001  . 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 S/P endoscopy 12/07    mild gastritis  . Atrial fibrillation     PAF  . COPD (chronic obstructive pulmonary disease)   . Chronic kidney disease     stage 3  . CAD S/P percutaneous coronary angioplasty 2000, 2002, 2006    Status post PCI of proximal LAD and RI in roughly 2000; DMS 3.0 mm x 15 mm to mid RCA (March of 2) - ISR of RCA stent in October 2002 --> Cutting Balloon PTCA; May 2006: Cypher 2.5 mm x13 mm to mid LAD  . CAD in native artery  2007    Cardiac catheterization showed patent stents in the mid RCA, mid RI, proximal and mid LAD; no ischemia on Myoview stress test  . Combined systolic and diastolic heart failure, NYHA class 2   . Ischemic cardiomyopathy February 2004    Echo : EF 35-40%, severe anterior wall hypokinesis with thinning c/w old prior LAD infarct; grade 2 diastolic dysfunction; severely dilated left atrium  . Non-STEMI (non-ST elevated myocardial infarction)  12/12/2012  . Stable angina     Stable; is not interested in invasive evaluation  . Anemia   . Neuromuscular disorder   . Hypertension   . Parkinson's disease   . Hyperlipidemia   . Anxiety   . Depression     History of  . GERD (gastroesophageal reflux disease)   . Hypothyroidism (acquired)     Status post partial thyroidectomy  . Arthritis   . History of pneumonia     Past Surgical History  Procedure Laterality Date  . Thyroidectomy, partial  1933  . Appendectomy    . Cervical discectomy    . Cholecystectomy    . Rotator cuff repair    . Orif hip fracture  2001  . Splenectomy    . Femur im nail  10/09/2011    Procedure: INTRAMEDULLARY (IM) NAIL FEMORAL;  Surgeon: Kathryne Hitch;  Location: [facility] OR;  Service: Orthopedics;  Laterality: Left;  . Cardiac catheterization  1999    xomplex lesion in bifurfaction of LAD and 1st diagonal, stent to ramus was widely patent  . Cardiac catheterization  10/1998    stent to diagonal - repeat cath 8/200 showed 50% LAD instenotic lesion   . Cardiac catheterization  12/2000    PTCA to RCA  . Cardiac  catheterization  10//2002    stent and cutting balloon to RCA- repeat cath 09/2002 showed stable coronary anatomy  . Cardiac catheterization  02/2005    2.5x55mm Cypher DES to LAD for "gap lesion" between 2 previous stents - repeat cath in 08/2005 showed patent stents   . Cardiac catheterization  05/2006    patent stents in RCA, LAD, ramus intermedius, normal LV function   . Transthoracic echocardiogram  12/2012    EF 35-40%, mod conc LVH. systolic function mod reduced, grade 2 diastolic dysfunction; AV sclerosis; calcified MV annulus; mild TR    Family history: unable to obtain  Social History:  reports that she quit smoking about 42 years ago. Her smokeless tobacco use includes Chew. Her alcohol and drug histories are not on file.  Allergies:  Allergies  Allergen Reactions  . Atorvastatin     REACTION: Palpitations  . Carbidopa  W-Levodopa Diarrhea and Nausea Only     vivid dreams  . Codeine Nausea Only  . Duloxetine     REACTION: Constipation  . Gabapentin Nausea Only    REACTION: Pruritic rash,  lightheadedness  . Penicillins   . Sulfamethoxazole-Trimethoprim Nausea Only  . Trimethoprim Other (See Comments)    Per mar  . Venlafaxine     REACTION: orthostasis    Medications: I have reviewed the patient's current medications. Prior to Admission:  Current outpatient prescriptions:acetaminophen (TYLENOL) 325 MG tablet, Take 650 mg by mouth at bedtime., Disp: , Rfl: ;  aspirin (BABY ASPIRIN) 81 MG chewable tablet, Chew 81 mg by mouth every morning. , Disp: , Rfl: ;  bisacodyl (DULCOLAX) 10 MG suppository, Place 10 mg rectally every 3 (three) days., Disp: , Rfl: ;  Calcium Carbonate-Vitamin D (OYSCO 500+D PO), Take 1 tablet by mouth daily. , Disp: , Rfl:  Cholecalciferol (VITAMIN D-3) 1000 UNITS CAPS, Take 1 capsule by mouth daily. , Disp: , Rfl: ;  clopidogrel (PLAVIX) 75 MG tablet, Take 75 mg by mouth every morning. , Disp: , Rfl: ;  diclofenac sodium (VOLTAREN) 1 % GEL, Apply 2 g topically 2 (two) times daily as needed. To left shoulder, Disp: , Rfl: ;  docusate sodium (COLACE) 100 MG capsule, Take 100 mg by mouth 2 (two) times daily., Disp: , Rfl:  ferrous sulfate 325 (65 FE) MG tablet, Take 325 mg by mouth every morning. , Disp: , Rfl: ;  fish oil-omega-3 fatty acids 1000 MG capsule, Take 1 g by mouth 2 (two) times daily. , Disp: , Rfl: ;  folic acid (FOLVITE) 400 MCG tablet, Take 800 mcg by mouth every morning., Disp: , Rfl: ;  guaiFENesin (MUCINEX) 600 MG 12 hr tablet, Take 600 mg by mouth 2 (two) times daily., Disp: , Rfl:  ipratropium-albuterol (DUONEB) 0.5-2.5 (3) MG/3ML SOLN, Take 3 mLs by nebulization every 6 (six) hours as needed., Disp: , Rfl: ;  isosorbide mononitrate (IMDUR) 60 MG 24 hr tablet, Take 60 mg by mouth at bedtime., Disp: , Rfl: ;  lansoprazole (PREVACID) 30 MG capsule, Take 30 mg by mouth every  morning. , Disp: , Rfl: ;  levothyroxine (LEVOTHROID) 75 MCG tablet, Take 1 tablet (75 mcg total) by mouth daily before breakfast., Disp: , Rfl:  lidocaine (LIDODERM) 5 %, Apply 1 patch to left knee at bedtime, wear 12 hours on and 12 hours off, Disp: , Rfl: ;  loratadine (CLARITIN) 10 MG tablet, Take 10 mg by mouth every morning., Disp: , Rfl: ;  LORazepam (ATIVAN) 0.5 MG tablet, Take 1/2 tablet  by mouth every morning. Take 1 tablet by mouth every evening., Disp: , Rfl:  metoprolol succinate (TOPROL-XL) 50 MG 24 hr tablet, Take 50 mg by mouth daily. Take with or immediately following a meal., Disp: , Rfl: ;  mirtazapine (REMERON) 7.5 MG tablet, Take 7.5 mg by mouth at bedtime., Disp: , Rfl: ;  nitroGLYCERIN (NITROSTAT) 0.4 MG SL tablet, Place 0.4 mg under the tongue every 5 (five) minutes x 3 doses as needed. For chest pain., Disp: , Rfl:  oxyCODONE-acetaminophen (PERCOCET) 5-325 MG per tablet, Take 1 tablet by mouth every 4 (four) hours as needed. pain, Disp: , Rfl: ;  OXYGEN-HELIUM IN, Inhale into the lungs as needed. , Disp: , Rfl: ;  polyethylene glycol (MIRALAX) powder, Take 17 g by mouth every evening. For constipation, Disp: , Rfl: ;  polyvinyl alcohol (LIQUIFILM TEARS) 1.4 % ophthalmic solution, Place 1 drop into both eyes 4 (four) times daily. For dry eyes , Disp: , Rfl:  potassium chloride SA (K-DUR,KLOR-CON) 20 MEQ tablet, Take 20 mEq by mouth every evening. , Disp: , Rfl: ;  senna (SENOKOT) 8.6 MG TABS, Take 1 tablet by mouth every other day., Disp: , Rfl: ;  sertraline (ZOLOFT) 25 MG tablet, Take 25 mg by mouth every morning., Disp: , Rfl: ;  Tamsulosin HCl (FLOMAX) 0.4 MG CAPS, Take 1 capsule (0.4 mg total) by mouth daily after breakfast., Disp: 30 capsule, Rfl: 0 torsemide (DEMADEX) 20 MG tablet, Take 40 mg by mouth 2 (two) times daily., Disp: , Rfl: ;  traMADol (ULTRAM) 50 MG tablet, Take 50 mg by mouth 3 (three) times daily., Disp: , Rfl: ;  vitamin B-12 (CYANOCOBALAMIN) 1000 MCG tablet, Take  1,000 mcg by mouth every morning., Disp: , Rfl:   ROS: Unable to obtain  Physical Examination: Blood pressure 111/85, pulse 151, temperature 98 F (36.7 C), temperature source Oral, resp. rate 19, weight 58.06 kg (128 lb), SpO2 96.00%.  Neurologic Examination: Mental Status: Alert, not oriented.  Speech minimal with some word finding difficulties and slurred speech.  Follows some simple commands.   Cranial Nerves: II: Discs flat bilaterally; Does not blink to confrontation from the left, pupils equal, round, reactive to light and accommodation III,IV, VI: ptosis not present, right gaze preference V,VII: left facial droop VIII: hearing decreased bilaterally IX,X: gag reflex present XI: shoulder shrug decreased on the left XII: unable to test Motor: Moves right arm and leg against gravity.  Unable to maintain the left leg and arm against gravity Sensory: Difficult to test secondary to cooperation Deep Tendon Reflexes: 2+ and symmetric throughout with absent AJ's bilaterally Plantars: Right: mute   Left: mute Cerebellar: Unable to test Gait: Unable to test CV: pulses palpable throughout     Laboratory Studies:  Basic Metabolic Panel:  Recent Labs Lab 01/13/14 1759  NA 140  K 4.4  CL 96  CO2 28  GLUCOSE 123*  BUN 32*  CREATININE 1.53*  CALCIUM 10.1    Liver Function Tests:  Recent Labs Lab 01/13/14 1759  AST 18  ALT 10  ALKPHOS 85  BILITOT 0.4  PROT 7.5  ALBUMIN 3.8   No results found for this basename: LIPASE, AMYLASE,  in the last 168 hours No results found for this basename: AMMONIA,  in the last 168 hours  CBC:  Recent Labs Lab 01/13/14 1759  WBC 10.3  NEUTROABS 7.2  HGB 11.1*  HCT 34.0*  MCV 95.2  PLT 253    Cardiac Enzymes: No results found for  this basename: CKTOTAL, CKMB, CKMBINDEX, TROPONINI,  in the last 168 hours  BNP: No components found with this basename: POCBNP,   CBG:  Recent Labs Lab 01/13/14 1801  GLUCAP 117*     Microbiology: Results for orders placed during the hospital encounter of 12/12/12  URINE CULTURE     Status: None   Collection Time    12/15/12  9:20 AM      Result Value Ref Range Status   Specimen Description URINE, CATHETERIZED   Final   Special Requests NONE   Final   Culture  Setup Time 12/15/2012 18:01   Final   Colony Count >=100,000 COLONIES/ML   Final   Culture     Final   Value: PROTEUS MIRABILIS     ESCHERICHIA COLI   Report Status 12/18/2012 FINAL   Final   Organism ID, Bacteria PROTEUS MIRABILIS   Final   Organism ID, Bacteria ESCHERICHIA COLI   Final    Coagulation Studies:  Recent Labs  01/13/14 1759  LABPROT 14.0  INR 1.10    Urinalysis: No results found for this basename: COLORURINE, APPERANCEUR, LABSPEC, PHURINE, GLUCOSEU, HGBUR, BILIRUBINUR, KETONESUR, PROTEINUR, UROBILINOGEN, NITRITE, LEUKOCYTESUR,  in the last 168 hours  Lipid Panel:    Component Value Date/Time   CHOL 125 06/30/2011 0942   TRIG 146 06/30/2011 0942   HDL 50 06/30/2011 0942   CHOLHDL 2.5 06/30/2011 0942   VLDL 29 06/30/2011 0942   LDLCALC 46 06/30/2011 0942    HgbA1C:  No results found for this basename: HGBA1C    Urine Drug Screen:   No results found for this basename: labopia, cocainscrnur, labbenz, amphetmu, thcu, labbarb    Alcohol Level: No results found for this basename: ETH,  in the last 168 hours  Other results: EKG: poor quality.  Imaging: Ct Head Wo Contrast  01/13/2014   CLINICAL DATA:  Code stroke.  Confusion.  EXAM: CT HEAD WITHOUT CONTRAST  TECHNIQUE: Contiguous axial images were obtained from the base of the skull through the vertex without intravenous contrast.  COMPARISON:  12/25/2011  FINDINGS: There is no evidence of acute cortical infarct, intracranial hemorrhage, mass, midline shift, or extra-axial fluid collection. Moderate cerebral atrophy is unchanged. Periventricular white matter hypodensities do not appear significantly changed and are compatible  with moderate chronic small vessel ischemic disease.  Prior bilateral cataract surgery is noted. The mastoid air cells are clear. Mild mucosal thickening is noted in the left maxillary sinus and inferior left frontal sinus. There may be a small amount of fluid in the left maxillary sinus.  IMPRESSION: 1. No evidence of acute intracranial abnormality. 2. Unchanged cerebral atrophy and chronic small vessel ischemic disease. These results were called by telephone at the time of interpretation on 01/13/2014 at 6:17 PM to Dr. Anitra Lauth, who verbally acknowledged these results.   Electronically Signed   By: Sebastian Ache   On: 01/13/2014 18:18    Assessment: 80 y.o. female presenting with slurred speech and left sided weakness.  Patient has vascular risk factors.  Head CT reviewed and shows no acute changes.  Acute infarct suspected though.  Per wishes no intervention pursued.    Stroke Risk Factors - atrial fibrillation and hypertension  Plan: 1. Patient on an aspirin a day.   May continue.  If unable to take po may take rectally. 2. Therapy  Case discussed with Dr. Luvenia Redden, MD [facility] [phone] 01/13/2014, 6:52 PM

## 2014-01-13 NOTE — ED Notes (Signed)
EDP aware of elevated heart rate and ordered Cardizem

## 2014-01-14 DIAGNOSIS — I4891 Unspecified atrial fibrillation: Secondary | ICD-10-CM

## 2014-01-14 DIAGNOSIS — I10 Essential (primary) hypertension: Secondary | ICD-10-CM

## 2014-01-14 DIAGNOSIS — Z515 Encounter for palliative care: Secondary | ICD-10-CM

## 2014-01-14 DIAGNOSIS — I6350 Cerebral infarction due to unspecified occlusion or stenosis of unspecified cerebral artery: Secondary | ICD-10-CM

## 2014-01-14 LAB — COMPREHENSIVE METABOLIC PANEL
ALK PHOS: 76 U/L (ref 39–117)
ALT: 11 U/L (ref 0–35)
AST: 21 U/L (ref 0–37)
Albumin: 4 g/dL (ref 3.5–5.2)
BUN: 33 mg/dL — AB (ref 6–23)
CHLORIDE: 98 meq/L (ref 96–112)
CO2: 27 meq/L (ref 19–32)
Calcium: 9.7 mg/dL (ref 8.4–10.5)
Creatinine, Ser: 1.47 mg/dL — ABNORMAL HIGH (ref 0.50–1.10)
GFR calc Af Amer: 34 mL/min — ABNORMAL LOW (ref >90)
GFR, EST NON AFRICAN AMERICAN: 29 mL/min — AB (ref >90)
GLUCOSE: 138 mg/dL — AB (ref 70–99)
Potassium: 4.7 mEq/L (ref 3.7–5.3)
SODIUM: 142 meq/L (ref 137–147)
Total Bilirubin: 0.6 mg/dL (ref 0.3–1.2)
Total Protein: 7.6 g/dL (ref 6.0–8.3)

## 2014-01-14 LAB — CBC WITH DIFFERENTIAL/PLATELET
BASOS PCT: 0 % (ref 0–1)
Basophils Absolute: 0 10*3/uL (ref 0.0–0.1)
Eosinophils Absolute: 0.1 10*3/uL (ref 0.0–0.7)
Eosinophils Relative: 1 % (ref 0–5)
HEMATOCRIT: 34.8 % — AB (ref 36.0–46.0)
Hemoglobin: 11.1 g/dL — ABNORMAL LOW (ref 12.0–15.0)
Lymphocytes Relative: 19 % (ref 12–46)
Lymphs Abs: 2.6 10*3/uL (ref 0.7–4.0)
MCH: 30.2 pg (ref 26.0–34.0)
MCHC: 31.9 g/dL (ref 30.0–36.0)
MCV: 94.6 fL (ref 78.0–100.0)
MONO ABS: 0.6 10*3/uL (ref 0.1–1.0)
Monocytes Relative: 4 % (ref 3–12)
Neutro Abs: 10.2 10*3/uL — ABNORMAL HIGH (ref 1.7–7.7)
Neutrophils Relative %: 76 % (ref 43–77)
Platelets: 268 10*3/uL (ref 150–400)
RBC: 3.68 MIL/uL — ABNORMAL LOW (ref 3.87–5.11)
RDW: 14.3 % (ref 11.5–15.5)
WBC: 13.4 10*3/uL — AB (ref 4.0–10.5)

## 2014-01-14 LAB — LIPID PANEL
CHOL/HDL RATIO: 2.8 ratio
Cholesterol: 159 mg/dL (ref 0–200)
HDL: 56 mg/dL (ref >39)
LDL Cholesterol: 83 mg/dL (ref 0–99)
Triglycerides: 98 mg/dL (ref <150)
VLDL: 20 mg/dL (ref 0–40)

## 2014-01-14 LAB — HEMOGLOBIN A1C
HEMOGLOBIN A1C: 5.5 % (ref <5.7)
Mean Plasma Glucose: 111 mg/dL (ref <117)

## 2014-01-14 LAB — MRSA PCR SCREENING: MRSA BY PCR: NEGATIVE

## 2014-01-14 MED ORDER — ATROPINE SULFATE 1 % OP SOLN
2.0000 [drp] | Freq: Four times a day (QID) | OPHTHALMIC | Status: DC
  Administered 2014-01-14: 2 [drp] via SUBLINGUAL
  Filled 2014-01-14: qty 2

## 2014-01-14 MED ORDER — LORAZEPAM 2 MG/ML IJ SOLN: 1.0000 mg | INTRAMUSCULAR | Status: DC | PRN

## 2014-01-14 MED ORDER — ATROPINE SULFATE 1 % OP SOLN: 4.0000 [drp] | OPHTHALMIC | Status: DC | PRN

## 2014-01-14 MED ORDER — ONDANSETRON HCL 4 MG/2ML IJ SOLN: 4.0000 mg | Freq: Four times a day (QID) | INTRAMUSCULAR | Status: DC | PRN

## 2014-01-14 MED ORDER — MORPHINE SULFATE 2 MG/ML IJ SOLN
1.0000 mg | INTRAMUSCULAR | Status: DC | PRN
  Administered 2014-01-14: 1 mg via INTRAVENOUS
  Filled 2014-01-14: qty 1

## 2014-01-14 MED ORDER — BISACODYL 10 MG RE SUPP: 10.0000 mg | Freq: Every day | RECTAL | Status: DC | PRN

## 2014-01-14 MED ORDER — SCOPOLAMINE 1 MG/3DAYS TD PT72
1.0000 | MEDICATED_PATCH | TRANSDERMAL | Status: DC
  Administered 2014-01-14: 1.5 mg via TRANSDERMAL
  Filled 2014-01-14: qty 1

## 2014-01-14 MED ORDER — LORAZEPAM 2 MG/ML IJ SOLN
0.5000 mg | Freq: Three times a day (TID) | INTRAMUSCULAR | Status: DC
  Administered 2014-01-14 – 2014-01-15 (×4): 0.5 mg via INTRAVENOUS
  Filled 2014-01-14 (×4): qty 1

## 2014-01-14 MED ORDER — MORPHINE SULFATE 2 MG/ML IJ SOLN: 1.0000 mg | INTRAMUSCULAR | Status: DC | PRN

## 2014-01-14 MED ORDER — MORPHINE SULFATE (CONCENTRATE) 10 MG /0.5 ML PO SOLN: 5.0000 mg | ORAL | Status: DC | PRN

## 2014-01-14 MED ORDER — ACETAMINOPHEN 650 MG RE SUPP: 650.0000 mg | Freq: Four times a day (QID) | RECTAL | Status: DC | PRN

## 2014-01-14 NOTE — Consult Note (Signed)
I have reviewed and discussed the care of this patient in detail with the nurse practitioner including pertinent patient records, physical exam findings and data. I agree with details of this encounter.  

## 2014-01-14 NOTE — Progress Notes (Signed)
Spoke at length with Son-Ronald at bedside, explained poor overall prognoses, also explained that further stroke work up will not change management. At this time, family is agreeable to transition to full comfort care status. Will stop tele monitoring, stop ASA-and start Morphine, Lorazepam and transfer to med surg. Will likely need residential hospice placement on discharge.

## 2014-01-14 NOTE — Progress Notes (Signed)
Stroke Team Progress Note  HISTORY [name] is an 80 y.o. female who is a SNF resident. Patient was normal until this after noon 01/13/2014 when found by staff to have left sided weakness and not be responding appropriately. EMS was called at that time and the patient was brought in as a code stroke. Initial NIHSS of 14. Review of records from SNF show family has requested patient not to be brought to the hospital if she has stroke symptoms but to have therapy at the facility. last known well 01/13/2014 at 15:00. Patient was not administerd TPA secondary to SNF documents express family wishes for patient to not have aggressive treatment for stroke. She was admitted to the step down unit for further evaluation and treatment.  SUBJECTIVE Her family member is at the bedside; states he son will be here soon.   OBJECTIVE Most recent Vital Signs: Filed Vitals:   01/14/14 0500 01/14/14 0605 01/14/14 0700 01/14/14 0800  BP: 101/47 95/46 108/45 107/41  Pulse: 68 61 46 51  Temp:   98.2 F (36.8 C)   TempSrc:   Oral   Resp: 24 21 23 24   Height:      Weight:      SpO2: 96% 96% 97% 97%   CBG (last 3)   Recent Labs  01/13/14 1801  GLUCAP 117*    IV Fluid Intake:   . diltiazem (CARDIZEM) infusion 5 mg/hr (01/14/14 9811)    MEDICATIONS  . aspirin  300 mg Rectal Daily   Or  . aspirin  325 mg Oral Daily  . [START ON 01/28/2014] cyanocobalamin  1,000 mcg Intramuscular Q30 days  . docusate sodium  100 mg Oral BID  . enoxaparin (LOVENOX) injection  30 mg Subcutaneous Q24H  . ferrous sulfate  325 mg Oral q morning - 10a  . folic acid  800 mcg Oral q morning - 10a  . isosorbide mononitrate  60 mg Oral QHS  . levothyroxine  37.5 mcg Intravenous Daily  . lidocaine  1 patch Transdermal Q24H  . loratadine  10 mg Oral q morning - 10a  . mirtazapine  7.5 mg Oral QHS  . omega-3 acid ethyl esters  1 g Oral Daily  . polyethylene glycol  17 g Oral QHS  . sertraline  25 mg Oral q morning - 10a   . tamsulosin  0.4 mg Oral QPC breakfast   PRN:  diclofenac sodium, ipratropium-albuterol, nitroGLYCERIN, senna-docusate  Diet:  NPO  Activity:  OOB with assistance DVT Prophylaxis:  Lovenox 30 mg sq daily   CLINICALLY SIGNIFICANT STUDIES Basic Metabolic Panel:   Recent Labs Lab 01/13/14 1759 01/14/14 0336  NA 140 142  K 4.4 4.7  CL 96 98  CO2 28 27  GLUCOSE 123* 138*  BUN 32* 33*  CREATININE 1.53* 1.47*  CALCIUM 10.1 9.7   Liver Function Tests:   Recent Labs Lab 01/13/14 1759 01/14/14 0336  AST 18 21  ALT 10 11  ALKPHOS 85 76  BILITOT 0.4 0.6  PROT 7.5 7.6  ALBUMIN 3.8 4.0   CBC:   Recent Labs Lab 01/13/14 1759 01/14/14 0336  WBC 10.3 13.4*  NEUTROABS 7.2 10.2*  HGB 11.1* 11.1*  HCT 34.0* 34.8*  MCV 95.2 94.6  PLT 253 268   Coagulation:   Recent Labs Lab 01/13/14 1759  LABPROT 14.0  INR 1.10   Cardiac Enzymes: No results found for this basename: CKTOTAL, CKMB, CKMBINDEX, TROPONINI,  in the last 168 hours Urinalysis: No results found  for this basename: COLORURINE, APPERANCEUR, LABSPEC, PHURINE, GLUCOSEU, HGBUR, BILIRUBINUR, KETONESUR, PROTEINUR, UROBILINOGEN, NITRITE, LEUKOCYTESUR,  in the last 168 hours Lipid Panel    Component Value Date/Time   CHOL 159 01/14/2014 0336   TRIG 98 01/14/2014 0336   HDL 56 01/14/2014 0336   CHOLHDL 2.8 01/14/2014 0336   VLDL 20 01/14/2014 0336   LDLCALC 83 01/14/2014 0336   HgbA1C  No results found for this basename: HGBA1C    Urine Drug Screen:   No results found for this basename: labopia,  cocainscrnur,  labbenz,  amphetmu,  thcu,  labbarb    Alcohol Level: No results found for this basename: ETH,  in the last 168 hours  CT of the brain  01/13/2014   1. No evidence of acute intracranial abnormality. 2. Unchanged cerebral atrophy and chronic small vessel ischemic disease.   CXR  01/13/2014    Congestive heart failure with progressive pulmonary interstitial edema. Superimposed interstitial pneumonitis cannot be  excluded.    EKG  Extreme sinus tachycardia. For complete results please see formal report.   Therapy Recommendations   Physical Exam   Frail cachectic elderly lady not in distress. . Afebrile. Head is nontraumatic. Neck is supple without bruit.   Cardiac exam no murmur or gallop. Lungs are clear to auscultation. Distal pulses are well felt. Neurologic Exam : Stuporous and will not open eyes. Eyes closed. Right gaze deviation and will not move the eyes to midline even with reflex eye movements. Pupils irregular and sluggishly reactive. Does not blink to threat on either side. Mild left lower facial weakness. Tongue midline. Dense left hemiplegia with no response even to painful stimuli. Antigravity semipurposeful movements on the right pain. Left plantar is upgoing right is downgoing. Tone is diminished on the left. ASSESSMENT Ms. [name] is a 80 y.o. female SNF resident presenting with left sided weakness. While initial CT imaging is negative, by neurologic exam patient has a large right brain infarct. Infarct felt to be embolic secondary to known atrial fibrillation .  On aspirin 81 mg orally every day and clopidogrel 75 mg orally every day prior to admission. Now on aspirin for secondary stroke prevention. Patient with resultant left hemiparesis, dysphagia, right gaze preference, L HH,. Neuro prognosis for meaningful recovery poor. Prior wishes indicate she/family did not desire aggressive care. Stroke work up underway.   atrial fibrillation   Chronic kidney disease stage 3  Ischemic cardiomyopathy Hyperlipidemia, LDL 83, on lovaza and fish oil PTA, at goal LDL < 100  Hx DVT 2000 NSTEMI hypertension  Hospital day # 1  TREATMENT/PLAN  Given patient wishes and neuro prognosis, would not do further workup  DNR and palliative care recommended.  Dr. Pearlean BrownieSethi is available to speak with family as needed. Discussed with Dr. Frankey ShownGhimire  Sharon Biby, MSN, RN, ANVP-BC, AGPCNP-BC [facility] Pager: [phone] 01/14/2014 9:33 AM  I have personally obtained a history, examined the patient, evaluated imaging results, and formulated the assessment and plan of care. I agree with the above. Delia HeadyPramod Sethi, MD   To contact Stroke Continuity provider, please refer to [email or web address] After hours, contact General Neurology

## 2014-01-14 NOTE — ED Provider Notes (Signed)
I saw and evaluated the patient, reviewed the resident's note and I agree with the findings and plan.   EKG Interpretation   Date/Time:  Monday January 13 2014 18:15:25 EDT Ventricular Rate:  155 PR Interval:    QRS Duration: 171 QT Interval:    QTC Calculation:   R Axis:   8 Text Interpretation:  Poor quality data, interpretation may be affected  Extreme tachycardia with wide complex, no further rhythm analysis  attempted Artifact in lead(s) I II III aVR aVL aVF V1 V2 V3 V4 V5 V6  unable to evaluate to severe artifact Confirmed by Anitra LauthPLUNKETT  MD, Alphonzo LemmingsWHITNEY  [phone]([id number]) on 01/14/2014 8:30:31 AM      I have reviewed EKG and agree with the resident interpretation.  you Pt arrived as code stroke with left sided deficits and hemineglect with significant change from baseline.  LSN was appx 2 hour PTA.  Pt arrived from SNF with paperwork that stated pt was not to have any aggressive management for CVA however family wished for treatment.  Neuro at bedside and feel due to age and comorbidities pt is not a candidate for tPA.  Pt also found to be in a.fib RVR requiring cardizem gtt and bolus with good rate control.  Pt admitted to medicine for further care.  Some dysphagia and difficulty clearing secretions here so pt kept NPO.  CRITICAL CARE Performed by: Gwyneth SproutPLUNKETT,Brocha Gilliam Total critical care time: 30 Critical care time was exclusive of separately billable procedures and treating other patients. Critical care was necessary to treat or prevent imminent or life-threatening deterioration. Critical care was time spent personally by me on the following activities: development of treatment plan with patient and/or surrogate as well as nursing, discussions with consultants, evaluation of patient's response to treatment, examination of patient, obtaining history from patient or surrogate, ordering and performing treatments and interventions, ordering and review of laboratory studies, ordering and review of  radiographic studies, pulse oximetry and re-evaluation of patient's condition.   Gwyneth SproutWhitney Drexel Ivey, MD 01/14/14 [phone]

## 2014-01-14 NOTE — Care Management Note (Signed)
    Page 1 of 1   01/14/2014     2:21:12 PM   CARE MANAGEMENT NOTE 01/14/2014  Patient:  [name],[name]   Account Number:  [id number]  Date Initiated:  01/14/2014  Documentation initiated by:  Donn PieriniWEBSTER,Shironda Kain  Subjective/Objective Assessment:   Pt admitted with CVA and afib     Action/Plan:   [facility] consulted for placement needs-   Anticipated DC Date:  01/16/2014   Anticipated DC Plan:    In-house referral  Clinical Social Worker      DC Planning Services  CM consult      Choice offered to / List presented to:             Status of service:  In process, will continue to follow Medicare Important Message given?   (If response is "NO", the following Medicare IM given date fields will be blank) Date Medicare IM given:   Date Additional Medicare IM given:    Discharge Disposition:    Per UR Regulation:  Reviewed for med. necessity/level of care/duration of stay  If discussed at Long Length of Stay Meetings, dates discussed:    Comments:  01/14/14- 1400- Donn PieriniKristi Zhoe Catania RN, BSN [phone] Per MD pt now full comfort care- PC consulted- msg left for CSW regarding return to SNF with hospice care vs hospice facility placement- vs GIP- will f/U once PC sees pt.

## 2014-01-14 NOTE — Progress Notes (Signed)
PATIENT DETAILS Name: [name] Age: 80 y.o. Sex: female Date of Birth: 06-01-1917 Admit Date: 01/13/2014 Admitting Physician Eduard Clos, MD ZOX:[id number], MD  Subjective: Lethargic, opens eyes, mumbles incoherently.   Assessment/Plan: Principal Problem:   Acute CVA (cerebral infarction) - Significant left-sided hemiparesis - Elderly,very frail patient, suspect further stroke workup would not change management and overall outcome. - Hold MRI brain for now, would do a speech therapy eval, will talk with family before pursuing any further stroke workup. Suspect good candidate for residential hospice placement. - Continue with aspirin, has A. fib but not a candidate for anticoagulation  Active Problems: A. fib with RVR - Continue Cardizem drip, await speech therapy evaluation to see if she can take oral meds, otherwise we will need to discuss hospice placement and end of life care. As noted above, not a candidate for anticoagulation - Reviewed patient's prior advanced directives, she did not wish PEG tube placement.    HYPOTHYROIDISM, UNSPECIFIED - Resume levothyroxine when oral intake resumes    HYPERLIPIDEMIA - Resume statins if possible when oral intake resumes  Palliative care/ethics - DO NOT RESUSCITATE awaiting call back - Reviewed prior advanced directives available in the paper chart, she did not want hospitalization in case of a acute CVA. We'll await arrival of family to further the need goals of care. I suspect if the patient does very poorly on her swallow eval, she will need residential hospice placement.  Disposition: Remain inpatient  DVT Prophylaxis: Prophylactic Lovenox   Code Status:  DNR  Family Communication Called Mr. Windy Fast and Dorinda Hill Dineen-awaiting call back  Procedures:  None  CONSULTS:  neurology  Time spent 40 minutes-which includes 50% of the time with face-to-face with patient/ family and coordinating care  related to the above assessment and plan.    MEDICATIONS: Scheduled Meds: . aspirin  300 mg Rectal Daily   Or  . aspirin  325 mg Oral Daily  . [START ON 01/28/2014] cyanocobalamin  1,000 mcg Intramuscular Q30 days  . docusate sodium  100 mg Oral BID  . enoxaparin (LOVENOX) injection  30 mg Subcutaneous Q24H  . ferrous sulfate  325 mg Oral q morning - 10a  . folic acid  800 mcg Oral q morning - 10a  . isosorbide mononitrate  60 mg Oral QHS  . levothyroxine  37.5 mcg Intravenous Daily  . lidocaine  1 patch Transdermal Q24H  . loratadine  10 mg Oral q morning - 10a  . mirtazapine  7.5 mg Oral QHS  . omega-3 acid ethyl esters  1 g Oral Daily  . polyethylene glycol  17 g Oral QHS  . sertraline  25 mg Oral q morning - 10a  . tamsulosin  0.4 mg Oral QPC breakfast   Continuous Infusions: . diltiazem (CARDIZEM) infusion 5 mg/hr (01/14/14 0608)   PRN Meds:.diclofenac sodium, ipratropium-albuterol, nitroGLYCERIN, senna-docusate  Antibiotics: Anti-infectives   None       PHYSICAL EXAM: Vital signs in last 24 hours: Filed Vitals:   01/14/14 0500 01/14/14 0605 01/14/14 0700 01/14/14 0800  BP: 101/47 95/46 108/45 107/41  Pulse: 68 61 46 51  Temp:   98.2 F (36.8 C)   TempSrc:   Oral   Resp: 24 21 23 24   Height:      Weight:      SpO2: 96% 96% 97% 97%    Weight change:  Filed Weights   01/13/14 1824 01/13/14 2324  Weight: 58.06 kg (128  lb) 62.5 kg (137 lb 12.6 oz)   Body mass index is 26.91 kg/(m^2).   Gen Exam: Lethargic, left facial droop  Neck: Supple, No JVD.  Chest: B/L Clear.   CVS: S1 S2 irregular Abdomen: soft, BS +, non tender, non distended.  Extremities: no edema, lower extremities warm to touch. Neurologic: Left-sided hemiparesis   Skin: No Rash.   Wounds: N/A.    Intake/Output from previous day:  Intake/Output Summary (Last 24 hours) at 01/14/14 1011 Last data filed at 01/14/14 0800  Gross per 24 hour  Intake  94.08 ml  Output      0 ml  Net   94.08 ml     LAB RESULTS: CBC  Recent Labs Lab 01/13/14 1759 01/14/14 0336  WBC 10.3 13.4*  HGB 11.1* 11.1*  HCT 34.0* 34.8*  PLT 253 268  MCV 95.2 94.6  MCH 31.1 30.2  MCHC 32.6 31.9  RDW 14.5 14.3  LYMPHSABS 2.1 2.6  MONOABS 0.7 0.6  EOSABS 0.4 0.1  BASOSABS 0.0 0.0    Chemistries   Recent Labs Lab 01/13/14 1759 01/14/14 0336  NA 140 142  K 4.4 4.7  CL 96 98  CO2 28 27  GLUCOSE 123* 138*  BUN 32* 33*  CREATININE 1.53* 1.47*  CALCIUM 10.1 9.7    CBG:  Recent Labs Lab 01/13/14 1801  GLUCAP 117*    GFR Estimated Creatinine Clearance: 19.3 ml/min (by C-G formula based on Cr of 1.47).  Coagulation profile  Recent Labs Lab 01/13/14 1759  INR 1.10    Cardiac Enzymes No results found for this basename: CK, CKMB, TROPONINI, MYOGLOBIN,  in the last 168 hours  No components found with this basename: POCBNP,  No results found for this basename: DDIMER,  in the last 72 hours No results found for this basename: HGBA1C,  in the last 72 hours  Recent Labs  01/14/14 0336  CHOL 159  HDL 56  LDLCALC 83  TRIG 98  CHOLHDL 2.8   No results found for this basename: TSH, T4TOTAL, FREET3, T3FREE, THYROIDAB,  in the last 72 hours No results found for this basename: VITAMINB12, FOLATE, FERRITIN, TIBC, IRON, RETICCTPCT,  in the last 72 hours No results found for this basename: LIPASE, AMYLASE,  in the last 72 hours  Urine Studies No results found for this basename: UACOL, UAPR, USPG, UPH, UTP, UGL, UKET, UBIL, UHGB, UNIT, UROB, ULEU, UEPI, UWBC, URBC, UBAC, CAST, CRYS, UCOM, BILUA,  in the last 72 hours  MICROBIOLOGY: Recent Results (from the past 240 hour(s))  MRSA PCR SCREENING     Status: None   Collection Time    01/14/14  1:21 AM      Result Value Ref Range Status   MRSA by PCR NEGATIVE  NEGATIVE Final   Comment:            The GeneXpert MRSA Assay (FDA     approved for NASAL specimens     only), is one component of a     comprehensive MRSA  colonization     surveillance program. It is not     intended to diagnose MRSA     infection nor to guide or     monitor treatment for     MRSA infections.    RADIOLOGY STUDIES/RESULTS: Ct Head Wo Contrast  01/13/2014   CLINICAL DATA:  Code stroke.  Confusion.  EXAM: CT HEAD WITHOUT CONTRAST  TECHNIQUE: Contiguous axial images were obtained from the base of the skull through  the vertex without intravenous contrast.  COMPARISON:  12/25/2011  FINDINGS: There is no evidence of acute cortical infarct, intracranial hemorrhage, mass, midline shift, or extra-axial fluid collection. Moderate cerebral atrophy is unchanged. Periventricular white matter hypodensities do not appear significantly changed and are compatible with moderate chronic small vessel ischemic disease.  Prior bilateral cataract surgery is noted. The mastoid air cells are clear. Mild mucosal thickening is noted in the left maxillary sinus and inferior left frontal sinus. There may be a small amount of fluid in the left maxillary sinus.  IMPRESSION: 1. No evidence of acute intracranial abnormality. 2. Unchanged cerebral atrophy and chronic small vessel ischemic disease. These results were called by telephone at the time of interpretation on 01/13/2014 at 6:17 PM to Dr. Anitra LauthPlunkett, who verbally acknowledged these results.   Electronically Signed   By: Sebastian AcheAllen  Grady   On: 01/13/2014 18:18   Dg Chest Port 1 View  01/13/2014   CLINICAL DATA:  Shortness of breath and cough.  EXAM: PORTABLE CHEST - 1 VIEW  COMPARISON:  DG CHEST 2 VIEW dated 12/17/2012  FINDINGS: Cardiomegaly with pulmonary vascular prominence and interstitial prominence with Kerley B-lines noted. Small right pleural effusion present. These findings have progressed from prior exam. These findings are consistent with congestive heart failure. No acute osseous abnormality.  IMPRESSION: Congestive heart failure with progressive pulmonary interstitial edema. Superimposed interstitial  pneumonitis cannot be excluded.   Electronically Signed   By: Maisie Fushomas  Register   On: 01/13/2014 23:06    Jeoffrey MassedGHIMIRE,SHANKER, MD  [facility] Pager:[phone]  If 7PM-7AM, please contact night-coverage [email or web address] Password [email or web address] 01/14/2014, 10:11 AM   LOS: 1 day

## 2014-01-14 NOTE — Progress Notes (Signed)
Nutrition Brief Note  Chart reviewed. Pt now transitioning to comfort care.  No further nutrition interventions warranted at this time.  Please re-consult as needed.   Aaleah Hirsch MS, RD, LDN Inpatient Registered Dietitian Pager: [phone] After-hours pager: [phone]    

## 2014-01-14 NOTE — Progress Notes (Signed)
Utilization review completed.  

## 2014-01-14 NOTE — Consult Note (Signed)
Patient [name]      DOB: 01-13-1917      QQV:[id number]     Consult Note from the Palliative Medicine Team at [facility] Requested by: Dr. Sloan Leiter     PCP: Wenda Low, MD Reason for Consultation: [place] and options    Phone Number:[phone]  Assessment of patients Current state: [name] is a 80 yo female admitted unresponsive with significant left-sided hemiparesis,left-sided facial droop, and slurred speech that is assumed to be CVA. She has PMH of atrial fibrillation, CAD s/p PCI, ischemic cardiomyopathy, hypothyroidism, B12 deficiency. CT head did not show any acute changes.  I met today with [name] (daughter-in-law and main caregiver), [name]'s daughter-in-law, and son [name] and his wife. Everyone agrees to go along with what [name] chooses as she is the one that has been caring most for [name] and they trust her to have her best interest at heart. They all agree that [name] should not suffer and they want to make sure that she is made comfortable. They are very realistic and understand that her prognosis is days to weeks and most likely closer to days and realize that she could pass at anytime. We discussed having everything in place tonight for her comfort. We discussed return to [name] vs. hospice facility and they believe she will likely be provided end of life care best in hospice care. I discussed that the social worker will likely be in contact with them about which hospice they would like (they tell me [name]) and will keep them updated on getting her a bed there.   [name] appears to be comfortably resting. She was a very social person and enjoyed travelling to the beach with her friends and taking people to appointments and helping. She also worked as Psychologist, counselling and various other jobs. Her family tells me that she was becoming more and more confused and forgetful with her dementia but was always a lovely and helping person. I will  continue to follow and support [name] and her family.    Goals of Care: 1.  Code Status: DNR   2. Scope of Treatment: 1. Vital Signs: daily 2. Respiratory/Oxygen: for comfort 3. Nutritional Support/Tube Feeds: no 4. IVF: no 5. Review of Medications to be discontinued: Minimize for comfort. 6. Labs: no 7. Telemetry: no 8. Consults: CSW for hospice facility   4. Disposition: Hopeful for [employer].    3. Symptom Management:   1. Anxiety/Agitation: Ativan scheduled and prn.  2. Pain: Morphine prn.  3. Bowel Regimen: Dulcolax supp prn.  4. Fever: Acetaminophen prn.  5. Nausea/Vomiting: Ondansetron prn.  6. Terminal Secretions: Scopolamine patch and atropine SL prn.   4. Psychosocial: Emotional support provided to patient and family.    Brief HPI:  80 yo female admitted unresponsive with significant left-sided hemiparesis, left-sided facial droop, and slurred speech that is assumed to be CVA. She has PMH of atrial fibrillation, CAD s/p PCI, ischemic cardiomyopathy, hypothyroidism, B12 deficiency. CT head did not show any acute changes.    ROS: Unable to assess - unresponsive.     PMH:  Past Medical History  Diagnosis Date  . Congenital anisocoria   . Hallux valgus   . Lung nodule 1999    stable on followup CT  . DVT of lower extremity (deep venous thrombosis) 7/200o  . 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 Lumbar stenosis 2001  . Cerebellar atrophy 07/2002  on MRI  . S/P endoscopy 12/07    mild gastritis  . Atrial fibrillation     PAF  . COPD (chronic obstructive pulmonary disease)   . Chronic kidney disease     stage 3  . CAD S/P percutaneous coronary angioplasty 2000, 2002, 2006    Status post PCI of proximal LAD and RI in roughly 2000; DMS 3.0 mm x 15 mm to mid RCA (March of 2) - ISR of RCA stent in October 2002 --> Cutting Balloon PTCA; May 2006: Cypher 2.5 mm x13 mm to mid LAD  . CAD in native artery  2007    Cardiac catheterization showed  patent stents in the mid RCA, mid RI, proximal and mid LAD; no ischemia on Myoview stress test  . Combined systolic and diastolic heart failure, NYHA class 2   . Ischemic cardiomyopathy February 2004    Echo : EF 35-40%, severe anterior wall hypokinesis with thinning c/w old prior LAD infarct; grade 2 diastolic dysfunction; severely dilated left atrium  . Non-STEMI (non-ST elevated myocardial infarction) 12/12/2012  . Stable angina     Stable; is not interested in invasive evaluation  . Anemia   . Neuromuscular disorder   . Hypertension   . Parkinson's disease   . Hyperlipidemia   . Anxiety   . Depression     History of  . GERD (gastroesophageal reflux disease)   . Hypothyroidism (acquired)     Status post partial thyroidectomy  . Arthritis   . History of pneumonia   . Stroke 01/13/2014     PSH: Past Surgical History  Procedure Laterality Date  . Thyroidectomy, partial  1933  . Appendectomy    . Cervical discectomy    . Cholecystectomy    . Rotator cuff repair    . Orif hip fracture  2001  . Splenectomy    . Femur im nail  10/09/2011    Procedure: INTRAMEDULLARY (IM) NAIL FEMORAL;  Surgeon: Mcarthur Rossetti;  Location: [facility];  Service: Orthopedics;  Laterality: Left;  . Cardiac catheterization  1999    xomplex lesion in bifurfaction of LAD and 1st diagonal, stent to ramus was widely patent  . Cardiac catheterization  10/1998    stent to diagonal - repeat cath 8/200 showed 50% LAD instenotic lesion   . Cardiac catheterization  12/2000    PTCA to RCA  . Cardiac catheterization  10//2002    stent and cutting balloon to RCA- repeat cath 09/2002 showed stable coronary anatomy  . Cardiac catheterization  02/2005    2.5x74mm Cypher DES to LAD for "gap lesion" between 2 previous stents - repeat cath in 08/2005 showed patent stents   . Cardiac catheterization  05/2006    patent stents in RCA, LAD, ramus intermedius, normal LV function   . Transthoracic echocardiogram  12/2012     EF 35-40%, mod conc LVH. systolic function mod reduced, grade 2 diastolic dysfunction; AV sclerosis; calcified MV annulus; mild TR   I have reviewed the FH and SH and  If appropriate update it with new information. Allergies  Allergen Reactions  . Ace Inhibitors     Per mar  . Atorvastatin     REACTION: Palpitations  . Carbidopa W-Levodopa Diarrhea and Nausea Only     vivid dreams  . Codeine Nausea Only  . Duloxetine     REACTION: Constipation  . Gabapentin Nausea Only    REACTION: Pruritic rash,  lightheadedness  . Penicillins   . Sulfamethoxazole-Trimethoprim Nausea Only  .  Trimethoprim Other (See Comments)    Per mar  . Venlafaxine     REACTION: orthostasis   Scheduled Meds: . atropine  2 drop Sublingual QID  . LORazepam  0.5 mg Intravenous 3 times per day  . scopolamine  1 patch Transdermal Q72H   Continuous Infusions:  PRN Meds:.LORazepam, morphine injection, morphine CONCENTRATE    BP 133/78  Pulse 91  Temp(Src) 98.5 F (36.9 C) (Axillary)  Resp 22  Ht 5' (1.524 m)  Wt 62.5 kg (137 lb 12.6 oz)  BMI 26.91 kg/m2  SpO2 94%   PPS: 10%   Intake/Output Summary (Last 24 hours) at 01/14/14 1656 Last data filed at 01/14/14 0800  Gross per 24 hour  Intake  94.08 ml  Output      0 ml  Net  94.08 ml   LBM: unknown                         Physical Exam:  General: Unsresponsive HEENT:  [place]/AT, no JVD, dry mucous membranes Chest: CTA throughout, decreased bases CVS: Irreg, S1 S2, weak 1+ radial pulses Abdomen: Soft, NT, ND, hypoactive BS Ext: Left sided weakness (moving right extremeties), no edema, warm to touch Neuro: Unresponsive, unable to follow commands  Labs: CBC    Component Value Date/Time   WBC 13.4* 01/14/2014 0336   RBC 3.68* 01/14/2014 0336   HGB 11.1* 01/14/2014 0336   HCT 34.8* 01/14/2014 0336   PLT 268 01/14/2014 0336   MCV 94.6 01/14/2014 0336   MCH 30.2 01/14/2014 0336   MCHC 31.9 01/14/2014 0336   RDW 14.3 01/14/2014 0336   LYMPHSABS 2.6  01/14/2014 0336   MONOABS 0.6 01/14/2014 0336   EOSABS 0.1 01/14/2014 0336   BASOSABS 0.0 01/14/2014 0336    BMET    Component Value Date/Time   NA 142 01/14/2014 0336   K 4.7 01/14/2014 0336   CL 98 01/14/2014 0336   CO2 27 01/14/2014 0336   GLUCOSE 138* 01/14/2014 0336   BUN 33* 01/14/2014 0336   CREATININE 1.47* 01/14/2014 0336   CREATININE 0.94 06/30/2011 0942   CALCIUM 9.7 01/14/2014 0336   GFRNONAA 29* 01/14/2014 0336   GFRAA 34* 01/14/2014 0336    CMP     Component Value Date/Time   NA 142 01/14/2014 0336   K 4.7 01/14/2014 0336   CL 98 01/14/2014 0336   CO2 27 01/14/2014 0336   GLUCOSE 138* 01/14/2014 0336   BUN 33* 01/14/2014 0336   CREATININE 1.47* 01/14/2014 0336   CREATININE 0.94 06/30/2011 0942   CALCIUM 9.7 01/14/2014 0336   PROT 7.6 01/14/2014 0336   ALBUMIN 4.0 01/14/2014 0336   AST 21 01/14/2014 0336   ALT 11 01/14/2014 0336   ALKPHOS 76 01/14/2014 0336   BILITOT 0.6 01/14/2014 0336   GFRNONAA 29* 01/14/2014 0336   GFRAA 34* 01/14/2014 0336     Time In Time Out Total Time Spent with Patient Total Overall Time  1530 1650 60MIN 80MIN    Greater than 50%  of this time was spent counseling and coordinating care related to the above assessment and plan.  Vinie Sill, NP Palliative Medicine Team Pager # [phone] (M-F 8a-5p) Team Phone # [phone] (Nights/Weekends)

## 2014-01-14 NOTE — H&P (Addendum)
[facility] History and Physical  [name] ZOX:[id number] DOB: 02-21-1917 DOA: 01/13/2014  Referring physician: ER physician. PCP: Georgann Housekeeper, MD   Chief Complaint: Slurred speech and left-sided weakness.  HPI: [name] is a 80 y.o. female history of atrial fibrillation, CAD status post PCI, atrophic relation, ischemic cardiomyopathy, hypothyroidism, B12 deficiency was brought to the ER after patient was found to be suddenly unresponsive and left-sided weakness. Patient has left-sided facial droop with slurred speech. CT head did not show anything acute on-call neurologist was consulted. As per patient's wishes no aggressive measures to be done if patient had stroke. Neurologist has recommended at the time just aspirin and observation. Later patient's family arrived and stated that patient is DO NOT RESUSCITATE but to go ahead with other stroke workup. I did discuss with on-call neurologist Dr. Cyril Mourning who at this time has advised that patient has known risk factor atrial fibrillation for stroke which probably causes stroke and at this time Dr. Cyril Mourning advise close observation. On exam patient does not follow commands. As per family patient had shortness of breath last 2 days. Patient did not have any nausea vomiting abdominal pain fever chills. In the ER patient was found to be in atrial fibrillation with RVR and was started on Cardizem infusion.   Review of Systems: As presented in the history of presenting illness, rest negative.  Past Medical History  Diagnosis Date  . Congenital anisocoria   . Hallux valgus   . Lung nodule 1999    stable on followup CT  . DVT of lower extremity (deep venous thrombosis) 7/200o  . 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 Lumbar stenosis 2001  . 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 S/P endoscopy 12/07    mild gastritis  . Atrial fibrillation     PAF  . COPD (chronic obstructive pulmonary disease)   . Chronic kidney  disease     stage 3  . CAD S/P percutaneous coronary angioplasty 2000, 2002, 2006    Status post PCI of proximal LAD and RI in roughly 2000; DMS 3.0 mm x 15 mm to mid RCA (March of 2) - ISR of RCA stent in October 2002 --> Cutting Balloon PTCA; May 2006: Cypher 2.5 mm x13 mm to mid LAD  . CAD in native artery  2007    Cardiac catheterization showed patent stents in the mid RCA, mid RI, proximal and mid LAD; no ischemia on Myoview stress test  . Combined systolic and diastolic heart failure, NYHA class 2   . Ischemic cardiomyopathy February 2004    Echo : EF 35-40%, severe anterior wall hypokinesis with thinning c/w old prior LAD infarct; grade 2 diastolic dysfunction; severely dilated left atrium  . Non-STEMI (non-ST elevated myocardial infarction) 12/12/2012  . Stable angina     Stable; is not interested in invasive evaluation  . Anemia   . Neuromuscular disorder   . Hypertension   . Parkinson's disease   . Hyperlipidemia   . Anxiety   . Depression     History of  . GERD (gastroesophageal reflux disease)   . Hypothyroidism (acquired)     Status post partial thyroidectomy  . Arthritis   . History of pneumonia   . Stroke 01/13/2014   Past Surgical History  Procedure Laterality Date  . Thyroidectomy, partial  1933  . Appendectomy    . Cervical discectomy    . Cholecystectomy    . Rotator cuff repair    .  Orif hip fracture  2001  . Splenectomy    . Femur im nail  10/09/2011    Procedure: INTRAMEDULLARY (IM) NAIL FEMORAL;  Surgeon: Kathryne Hitch;  Location: [facility] OR;  Service: Orthopedics;  Laterality: Left;  . Cardiac catheterization  1999    xomplex lesion in bifurfaction of LAD and 1st diagonal, stent to ramus was widely patent  . Cardiac catheterization  10/1998    stent to diagonal - repeat cath 8/200 showed 50% LAD instenotic lesion   . Cardiac catheterization  12/2000    PTCA to RCA  . Cardiac catheterization  10//2002    stent and cutting balloon to RCA- repeat cath  09/2002 showed stable coronary anatomy  . Cardiac catheterization  02/2005    2.5x41mm Cypher DES to LAD for "gap lesion" between 2 previous stents - repeat cath in 08/2005 showed patent stents   . Cardiac catheterization  05/2006    patent stents in RCA, LAD, ramus intermedius, normal LV function   . Transthoracic echocardiogram  12/2012    EF 35-40%, mod conc LVH. systolic function mod reduced, grade 2 diastolic dysfunction; AV sclerosis; calcified MV annulus; mild TR   Social History:  reports that she quit smoking about 42 years ago. Her smokeless tobacco use includes Chew. Her alcohol and drug histories are not on file. Where does patient live nursing home. Can patient participate in ADLs? No.  Allergies  Allergen Reactions  . Ace Inhibitors     Per mar  . Atorvastatin     REACTION: Palpitations  . Carbidopa W-Levodopa Diarrhea and Nausea Only     vivid dreams  . Codeine Nausea Only  . Duloxetine     REACTION: Constipation  . Gabapentin Nausea Only    REACTION: Pruritic rash,  lightheadedness  . Penicillins   . Sulfamethoxazole-Trimethoprim Nausea Only  . Trimethoprim Other (See Comments)    Per mar  . Venlafaxine     REACTION: orthostasis    Family History: History reviewed. No pertinent family history.    Prior to Admission medications   Medication Sig Start Date End Date Taking? Authorizing Provider  acetaminophen (TYLENOL) 325 MG tablet Take 650 mg by mouth at bedtime.   Yes Historical Provider, MD  aspirin (BABY ASPIRIN) 81 MG chewable tablet Chew 81 mg by mouth every morning.    Yes Historical Provider, MD  Calcium Carbonate-Vitamin D (OYSCO 500+D PO) Take 1 tablet by mouth daily.    Yes Historical Provider, MD  Cholecalciferol (VITAMIN D-3) 1000 UNITS CAPS Take 1 capsule by mouth daily.    Yes Historical Provider, MD  clopidogrel (PLAVIX) 75 MG tablet Take 75 mg by mouth every morning.    Yes Historical Provider, MD  diclofenac sodium (VOLTAREN) 1 % GEL Apply 2 g  topically 2 (two) times daily as needed. To left shoulder   Yes Historical Provider, MD  docusate sodium (COLACE) 100 MG capsule Take 100 mg by mouth 2 (two) times daily.   Yes Historical Provider, MD  ferrous sulfate 325 (65 FE) MG tablet Take 325 mg by mouth every morning.    Yes Historical Provider, MD  fish oil-omega-3 fatty acids 1000 MG capsule Take 1 g by mouth 2 (two) times daily.    Yes Historical Provider, MD  folic acid (FOLVITE) 400 MCG tablet Take 800 mcg by mouth every morning.   Yes Historical Provider, MD  guaiFENesin (MUCINEX) 600 MG 12 hr tablet Take 600 mg by mouth 2 (two) times daily.   Yes  Historical Provider, MD  ipratropium-albuterol (DUONEB) 0.5-2.5 (3) MG/3ML SOLN Take 3 mLs by nebulization every 6 (six) hours as needed.   Yes Historical Provider, MD  isosorbide mononitrate (IMDUR) 60 MG 24 hr tablet Take 60 mg by mouth at bedtime.   Yes Historical Provider, MD  lansoprazole (PREVACID) 30 MG capsule Take 30 mg by mouth every morning.    Yes Historical Provider, MD  levothyroxine (LEVOTHROID) 75 MCG tablet Take 1 tablet (75 mcg total) by mouth daily before breakfast. 09/28/11  Yes Zachery Dauer, MD  lidocaine (LIDODERM) 5 % Apply 1 patch to left knee at bedtime, wear 12 hours on and 12 hours off   Yes Historical Provider, MD  loratadine (CLARITIN) 10 MG tablet Take 10 mg by mouth every morning.   Yes Historical Provider, MD  LORazepam (ATIVAN) 0.5 MG tablet Take 1/2 tablet by mouth every morning. Take 1 tablet by mouth every evening.   Yes Historical Provider, MD  metoprolol succinate (TOPROL-XL) 50 MG 24 hr tablet Take 50 mg by mouth daily. Take with or immediately following a meal.   Yes Historical Provider, MD  mirtazapine (REMERON) 7.5 MG tablet Take 7.5 mg by mouth at bedtime.   Yes Historical Provider, MD  nitroGLYCERIN (NITROSTAT) 0.4 MG SL tablet Place 0.4 mg under the tongue every 5 (five) minutes x 3 doses as needed. For chest pain.   Yes Historical Provider, MD   omega-3 acid ethyl esters (LOVAZA) 1 G capsule Take 1 g by mouth 2 (two) times daily.   Yes Historical Provider, MD  oxyCODONE-acetaminophen (PERCOCET) 5-325 MG per tablet Take 1 tablet by mouth every 4 (four) hours as needed. pain 10/12/11  Yes Ozella Rocks, MD  OXYGEN-HELIUM IN Inhale into the lungs as needed.    Yes Historical Provider, MD  polyethylene glycol (MIRALAX) powder Take 17 g by mouth every evening. For constipation   Yes Historical Provider, MD  polyvinyl alcohol (LIQUIFILM TEARS) 1.4 % ophthalmic solution Place 1 drop into both eyes 4 (four) times daily. For dry eyes    Yes Historical Provider, MD  potassium chloride SA (K-DUR,KLOR-CON) 20 MEQ tablet Take 20 mEq by mouth every evening.    Yes Historical Provider, MD  promethazine (PHENERGAN) 25 MG tablet Take 25 mg by mouth every 6 (six) hours as needed for nausea or vomiting.   Yes Historical Provider, MD  sennosides-docusate sodium (SENOKOT-S) 8.6-50 MG tablet Take 1 tablet by mouth daily.   Yes Historical Provider, MD  sertraline (ZOLOFT) 25 MG tablet Take 25 mg by mouth every morning.   Yes Historical Provider, MD  Tamsulosin HCl (FLOMAX) 0.4 MG CAPS Take 1 capsule (0.4 mg total) by mouth daily after breakfast. 10/12/11  Yes Ozella Rocks, MD  torsemide (DEMADEX) 20 MG tablet Take 40 mg by mouth 2 (two) times daily.   Yes Historical Provider, MD  traMADol (ULTRAM) 50 MG tablet Take 50 mg by mouth 3 (three) times daily.   Yes Historical Provider, MD    Physical Exam: Filed Vitals:   01/13/14 2230 01/13/14 2239 01/13/14 2324 01/14/14 0215  BP: 115/75  136/79 113/47  Pulse: 53   67  Temp:  98 F (36.7 C) 98.5 F (36.9 C)   TempSrc:   Oral   Resp: 17   24  Height:   5' (1.524 m)   Weight:   62.5 kg (137 lb 12.6 oz)   SpO2: 91%   96%     General:  Well-developed and poorly nourished.  Eyes: Anicteric no pallor.  ENT: No discharge from ears eyes nose mouth.  Neck: No mass felt.  Cardiovascular: S1-S2  heard.  Respiratory: No rhonchi or crepitations.  Abdomen: Soft nontender bowel sounds present.  Skin: No rash.  Musculoskeletal: No edema.  Psychiatric: Patient does not follow commands.  Neurologic: Patient does not follow commands. Left-sided facial droop. Patient does not move her left upper and lower extremities.  Labs on Admission:  Basic Metabolic Panel:  Recent Labs Lab 01/13/14 1759  NA 140  K 4.4  CL 96  CO2 28  GLUCOSE 123*  BUN 32*  CREATININE 1.53*  CALCIUM 10.1   Liver Function Tests:  Recent Labs Lab 01/13/14 1759  AST 18  ALT 10  ALKPHOS 85  BILITOT 0.4  PROT 7.5  ALBUMIN 3.8   No results found for this basename: LIPASE, AMYLASE,  in the last 168 hours No results found for this basename: AMMONIA,  in the last 168 hours CBC:  Recent Labs Lab 01/13/14 1759  WBC 10.3  NEUTROABS 7.2  HGB 11.1*  HCT 34.0*  MCV 95.2  PLT 253   Cardiac Enzymes: No results found for this basename: CKTOTAL, CKMB, CKMBINDEX, TROPONINI,  in the last 168 hours  BNP (last 3 results) No results found for this basename: PROBNP,  in the last 8760 hours CBG:  Recent Labs Lab 01/13/14 1801  GLUCAP 117*    Radiological Exams on Admission: Ct Head Wo Contrast  01/13/2014   CLINICAL DATA:  Code stroke.  Confusion.  EXAM: CT HEAD WITHOUT CONTRAST  TECHNIQUE: Contiguous axial images were obtained from the base of the skull through the vertex without intravenous contrast.  COMPARISON:  12/25/2011  FINDINGS: There is no evidence of acute cortical infarct, intracranial hemorrhage, mass, midline shift, or extra-axial fluid collection. Moderate cerebral atrophy is unchanged. Periventricular white matter hypodensities do not appear significantly changed and are compatible with moderate chronic small vessel ischemic disease.  Prior bilateral cataract surgery is noted. The mastoid air cells are clear. Mild mucosal thickening is noted in the left maxillary sinus and inferior left  frontal sinus. There may be a small amount of fluid in the left maxillary sinus.  IMPRESSION: 1. No evidence of acute intracranial abnormality. 2. Unchanged cerebral atrophy and chronic small vessel ischemic disease. These results were called by telephone at the time of interpretation on 01/13/2014 at 6:17 PM to Dr. Anitra Lauth, who verbally acknowledged these results.   Electronically Signed   By: Sebastian Ache   On: 01/13/2014 18:18   Dg Chest Port 1 View  01/13/2014   CLINICAL DATA:  Shortness of breath and cough.  EXAM: PORTABLE CHEST - 1 VIEW  COMPARISON:  DG CHEST 2 VIEW dated 12/17/2012  FINDINGS: Cardiomegaly with pulmonary vascular prominence and interstitial prominence with Kerley B-lines noted. Small right pleural effusion present. These findings have progressed from prior exam. These findings are consistent with congestive heart failure. No acute osseous abnormality.  IMPRESSION: Congestive heart failure with progressive pulmonary interstitial edema. Superimposed interstitial pneumonitis cannot be excluded.   Electronically Signed   By: Maisie Fus  Register   On: 01/13/2014 23:06     Assessment/Plan Principal Problem:   CVA (cerebral infarction) Active Problems:   HYPOTHYROIDISM, UNSPECIFIED   HYPERLIPIDEMIA   Atrial fibrillation with RVR   1. CVA - patient has known risk factor of atrial fibrillation. At this time I have discussed with family and an on-call neurologist. For now MRI brain only has been ordered to see  the extension of stroke. Since patient has known risk factor of atrial fibrillation which probably passed a stroke no further stroke workup has been ordered except for neurochecks swallow evaluation. Patient's family in agreement. Aspirin. 2. Atrial fibrillation with RVR - patient has been started on Cardizem infusion and once patient passes swallow may start patient on oral rate limiting medications. On aspirin. 3. Acute renal failure - closely follow intake output and metabolic  panel. 4. History of ischemic cardiomyopathy last EF 35-40% - since patient has acute stroke diuretic has been placed on hold but closely follow respiratory status and we need to restart diuretics based on patient's respiratory status. 5. Hypothyroidism - since patient has failed swallow. I have placed patient on IV Synthroid. 6. Anemia - patient has known history of pernicious anemia and is on B12 injections. Follow CBC. 7. COPD - presently not wheezing. 8. CAD - point-of-care troponins has been negative.  Patient's chest x-ray is pending. I did discuss with on-call neurologist.  Code Status: DO NOT RESUSCITATE.  Family Communication: Patient's daughter.  Disposition Plan: Admit to inpatient under Dr. Christene Slates.    Burel Kahre N. [facility] Pager [phone].  If 7PM-7AM, please contact night-coverage [email or web address] Password [email or web address] 01/14/2014, 3:03 AM

## 2014-01-14 NOTE — Progress Notes (Signed)
Pt to TX to [id number], VSS, called report. Family present & aware of TX.

## 2014-01-14 NOTE — Progress Notes (Signed)
OT Cancellation Note and Discharge  Patient Details Name: Christy GentlesGertrude M Patchin MRN: [id number] DOB: 02-Nov-1916   Cancelled Treatment:    Reason Eval/Treat Not Completed: Other (comment)/ PT now full comfort care, OT will not eval. Signing off.  Evette GeorgesLeonard, Yazmyn Valbuena Eva [phone] 01/14/2014, 2:29 PM

## 2014-01-14 NOTE — Progress Notes (Signed)
PT Cancellation Note  Patient Details Name: [name] MRN: [id number] DOB: [birth year]   Cancelled Treatment:    Reason Eval/Treat Not Completed: Fatigue/lethargy limiting ability to participate.  Pt lethargic and barely opens eyes when attempting to arouse.  Spoke with son and daughter-in-law and will hold attempting mobility at this time as family to discuss potential for Comfort Care.  Will continue to follow as wished by pt and family.     Sunny SchleinRitenour, Anabella Capshaw F, South CarolinaPT [phone] 01/14/2014, 11:13 AM

## 2014-01-14 NOTE — Progress Notes (Signed)
SLP Cancellation Note  Patient Details Name: [name] MRN: [id number] DOB: 09/26/1917   Cancelled treatment:       Reason Eval/Treat Not Completed: Medical issues which prohibited therapy; pt now comfort measures per Dr. Jerral RalphGhimire.  Will sign off.   Blenda MountsCouture, Sharilyn Geisinger Laurice 01/14/2014, 11:43 AM

## 2014-01-15 MED ORDER — MORPHINE SULFATE 2 MG/ML IJ SOLN: 1.0000 mg | INTRAMUSCULAR | Status: AC | PRN

## 2014-01-15 MED ORDER — MORPHINE SULFATE (CONCENTRATE) 10 MG /0.5 ML PO SOLN: 5.0000 mg | ORAL | Status: AC | PRN

## 2014-01-15 MED ORDER — SCOPOLAMINE 1 MG/3DAYS TD PT72: 1.0000 | MEDICATED_PATCH | TRANSDERMAL | Status: AC

## 2014-01-15 MED ORDER — LORAZEPAM 2 MG/ML IJ SOLN
1.0000 mg | INTRAMUSCULAR | Status: AC | PRN
Start: 2014-01-15 — End: ?

## 2014-01-15 NOTE — Clinical Social Work Note (Addendum)
CSW received consult for residential hospice placement for pt. CSW met with pt's daughter-in-law and son at bedside. Per family, first choice for residential hospice placement is [facility] and second choice is [facility]  ([employer] Per [employer], [facility] currently has no bed availability. Per [facility], their facility has bed availability and will be by [facility] at 1pm to assess pt and meet with family. CSW has informed pt's family of information above. CSW to continue to follow and assist with discharge planning needs.  Pt is discharging to [facility]) via ambulance on 01/15/2014. Discharge packet has been completed and placed on pt's shadow chart. Ambulance transportation has been arranged. RN and MD notified.  Pati Gallo, [place] Social Worker [phone]

## 2014-01-15 NOTE — Discharge Summary (Signed)
Physician Discharge Summary  DUSKA BRACKENRIDGE ZOX:[id number] DOB: Apr 04, 1917 DOA: 01/13/2014  PCP: Georgann Housekeeper, MD  Admit date: 01/13/2014 Discharge date: 01/15/2014  Time spent: 35 minutes  Recommendations for Outpatient Follow-up:  1. To [facility]  Discharge Diagnoses:  Principal Problem:   CVA (cerebral infarction) Active Problems:   HYPOTHYROIDISM, UNSPECIFIED   HYPERLIPIDEMIA   Chronic combined systolic and diastolic heart failure, NYHA class 2   Atrial fibrillation with RVR   Discharge Condition: terminal  Diet recommendation: as tolerated  Filed Weights   01/13/14 1824 01/13/14 2324  Weight: 58.06 kg (128 lb) 62.5 kg (137 lb 12.6 oz)    History of present illness:  [name] is a 80 y.o. female history of atrial fibrillation, CAD status post PCI, atrophic relation, ischemic cardiomyopathy, hypothyroidism, B12 deficiency was brought to the ER after patient was found to be suddenly unresponsive and left-sided weakness. Patient has left-sided facial droop with slurred speech. CT head did not show anything acute on-call neurologist was consulted. As per patient's wishes no aggressive measures to be done if patient had stroke. Neurologist has recommended at the time just aspirin and observation. Later patient's family arrived and stated that patient is DO NOT RESUSCITATE but to go ahead with other stroke workup. I did discuss with on-call neurologist Dr. Cyril Mourning who at this time has advised that patient has known risk factor atrial fibrillation for stroke which probably causes stroke and at this time Dr. Cyril Mourning advise close observation. On exam patient does not follow commands. As per family patient had shortness of breath last 2 days. Patient did not have any nausea vomiting abdominal pain fever chills. In the ER patient was found to be in atrial fibrillation with RVR and was started on Cardizem infusion.    Hospital Course:  Comfort care currently   Acute CVA  (cerebral infarction)  - Significant left-sided hemiparesis  - comfort care   A. fib with RVR  - Comfort care.   HYPOTHYROIDISM, UNSPECIFIED  Comfort focus care   HYPERLIPIDEMIA  -comfort focus care   Palliative care/ethics  - DO NOT RESUSCITATE  - Reviewed prior advanced directives available in the paper chart, she did not want hospitalization in case of a acute CVA. We'll await arrival of family to further the need goals of care. I suspect if the patient does very poorly on her swallow eval, she will need residential hospice placement.   Systolic and Diastolic Heart Failure  Congestive heart failure with progressive pulmonary interstitial  edema.  Patient has not received diuretics this admission as currently palliative care      Procedures:    Consultations:  Neuro  Palliative care  Discharge Exam: Filed Vitals:   01/15/14 0458  BP: 152/89  Pulse: 98  Temp: 98.4 F (36.9 C)  Resp: 23      Discharge Instructions  Discharge Orders   Future Orders Complete By Expires   Discharge instructions  As directed    Comments:     D/c IV       Medication List    STOP taking these medications       acetaminophen 325 MG tablet  Commonly known as:  TYLENOL     BABY ASPIRIN 81 MG chewable tablet  Generic drug:  aspirin     clopidogrel 75 MG tablet  Commonly known as:  PLAVIX     diclofenac sodium 1 % Gel  Commonly known as:  VOLTAREN     docusate sodium 100 MG capsule  Commonly known as:  COLACE     ferrous sulfate 325 (65 FE) MG tablet     fish oil-omega-3 fatty acids 1000 MG capsule     folic acid 400 MCG tablet  Commonly known as:  FOLVITE     guaiFENesin 600 MG 12 hr tablet  Commonly known as:  MUCINEX     ipratropium-albuterol 0.5-2.5 (3) MG/3ML Soln  Commonly known as:  DUONEB     isosorbide mononitrate 60 MG 24 hr tablet  Commonly known as:  IMDUR     lansoprazole 30 MG capsule  Commonly known as:  PREVACID     levothyroxine 75  MCG tablet  Commonly known as:  LEVOTHROID     lidocaine 5 %  Commonly known as:  LIDODERM     loratadine 10 MG tablet  Commonly known as:  CLARITIN     LORazepam 0.5 MG tablet  Commonly known as:  ATIVAN  Replaced by:  LORazepam 2 MG/ML injection     metoprolol succinate 50 MG 24 hr tablet  Commonly known as:  TOPROL-XL     MIRALAX powder  Generic drug:  polyethylene glycol powder     mirtazapine 7.5 MG tablet  Commonly known as:  REMERON     nitroGLYCERIN 0.4 MG SL tablet  Commonly known as:  NITROSTAT     omega-3 acid ethyl esters 1 G capsule  Commonly known as:  LOVAZA     oxyCODONE-acetaminophen 5-325 MG per tablet  Commonly known as:  PERCOCET/ROXICET     OXYGEN-HELIUM IN     OYSCO 500+D PO     polyvinyl alcohol 1.4 % ophthalmic solution  Commonly known as:  LIQUIFILM TEARS     potassium chloride SA 20 MEQ tablet  Commonly known as:  K-DUR,KLOR-CON     promethazine 25 MG tablet  Commonly known as:  PHENERGAN     sennosides-docusate sodium 8.6-50 MG tablet  Commonly known as:  SENOKOT-S     sertraline 25 MG tablet  Commonly known as:  ZOLOFT     tamsulosin 0.4 MG Caps capsule  Commonly known as:  FLOMAX     torsemide 20 MG tablet  Commonly known as:  DEMADEX     traMADol 50 MG tablet  Commonly known as:  ULTRAM     Vitamin D-3 1000 UNITS Caps      TAKE these medications       LORazepam 2 MG/ML injection  Commonly known as:  ATIVAN  Inject 0.5 mLs (1 mg total) into the vein every 4 (four) hours as needed for anxiety or sedation.     morphine 2 MG/ML injection  Inject 0.5 mLs (1 mg total) into the vein every 2 (two) hours as needed (sedation).     morphine CONCENTRATE 10 mg / 0.5 ml concentrated solution  Take 0.25 mLs (5 mg total) by mouth every 2 (two) hours as needed for severe pain or anxiety.     scopolamine 1 MG/3DAYS  Commonly known as:  TRANSDERM-SCOP  Place 1 patch (1.5 mg total) onto the skin every 3 (three) days.        Allergies  Allergen Reactions  . Ace Inhibitors     Per mar  . Atorvastatin     REACTION: Palpitations  . Carbidopa W-Levodopa Diarrhea and Nausea Only     vivid dreams  . Codeine Nausea Only  . Duloxetine     REACTION: Constipation  . Gabapentin Nausea Only    REACTION: Pruritic rash,  lightheadedness  .  Penicillins   . Sulfamethoxazole-Trimethoprim Nausea Only  . Trimethoprim Other (See Comments)    Per mar  . Venlafaxine     REACTION: orthostasis      The results of significant diagnostics from this hospitalization (including imaging, microbiology, ancillary and laboratory) are listed below for reference.    Significant Diagnostic Studies: Ct Head Wo Contrast  01/13/2014   CLINICAL DATA:  Code stroke.  Confusion.  EXAM: CT HEAD WITHOUT CONTRAST  TECHNIQUE: Contiguous axial images were obtained from the base of the skull through the vertex without intravenous contrast.  COMPARISON:  12/25/2011  FINDINGS: There is no evidence of acute cortical infarct, intracranial hemorrhage, mass, midline shift, or extra-axial fluid collection. Moderate cerebral atrophy is unchanged. Periventricular white matter hypodensities do not appear significantly changed and are compatible with moderate chronic small vessel ischemic disease.  Prior bilateral cataract surgery is noted. The mastoid air cells are clear. Mild mucosal thickening is noted in the left maxillary sinus and inferior left frontal sinus. There may be a small amount of fluid in the left maxillary sinus.  IMPRESSION: 1. No evidence of acute intracranial abnormality. 2. Unchanged cerebral atrophy and chronic small vessel ischemic disease. These results were called by telephone at the time of interpretation on 01/13/2014 at 6:17 PM to Dr. Anitra Lauth, who verbally acknowledged these results.   Electronically Signed   By: Sebastian Ache   On: 01/13/2014 18:18   Dg Chest Port 1 View  01/13/2014   CLINICAL DATA:  Shortness of breath and cough.   EXAM: PORTABLE CHEST - 1 VIEW  COMPARISON:  DG CHEST 2 VIEW dated 12/17/2012  FINDINGS: Cardiomegaly with pulmonary vascular prominence and interstitial prominence with Kerley B-lines noted. Small right pleural effusion present. These findings have progressed from prior exam. These findings are consistent with congestive heart failure. No acute osseous abnormality.  IMPRESSION: Congestive heart failure with progressive pulmonary interstitial edema. Superimposed interstitial pneumonitis cannot be excluded.   Electronically Signed   By: Maisie Fus  Register   On: 01/13/2014 23:06    Microbiology: Recent Results (from the past 240 hour(s))  MRSA PCR SCREENING     Status: None   Collection Time    01/14/14  1:21 AM      Result Value Ref Range Status   MRSA by PCR NEGATIVE  NEGATIVE Final   Comment:            The GeneXpert MRSA Assay (FDA     approved for NASAL specimens     only), is one component of a     comprehensive MRSA colonization     surveillance program. It is not     intended to diagnose MRSA     infection nor to guide or     monitor treatment for     MRSA infections.     Labs: Basic Metabolic Panel:  Recent Labs Lab 01/13/14 1759 01/14/14 0336  NA 140 142  K 4.4 4.7  CL 96 98  CO2 28 27  GLUCOSE 123* 138*  BUN 32* 33*  CREATININE 1.53* 1.47*  CALCIUM 10.1 9.7   Liver Function Tests:  Recent Labs Lab 01/13/14 1759 01/14/14 0336  AST 18 21  ALT 10 11  ALKPHOS 85 76  BILITOT 0.4 0.6  PROT 7.5 7.6  ALBUMIN 3.8 4.0   No results found for this basename: LIPASE, AMYLASE,  in the last 168 hours No results found for this basename: AMMONIA,  in the last 168 hours CBC:  Recent Labs  Lab 01/13/14 1759 01/14/14 0336  WBC 10.3 13.4*  NEUTROABS 7.2 10.2*  HGB 11.1* 11.1*  HCT 34.0* 34.8*  MCV 95.2 94.6  PLT 253 268   Cardiac Enzymes: No results found for this basename: CKTOTAL, CKMB, CKMBINDEX, TROPONINI,  in the last 168 hours BNP: BNP (last 3 results) No  results found for this basename: PROBNP,  in the last 8760 hours CBG:  Recent Labs Lab 01/13/14 1801  GLUCAP 117*       Signed:  Layne Dilauro  [facility] 01/15/2014, 1:27 PM

## 2014-01-15 NOTE — Progress Notes (Signed)
Stroke Team Progress Note  HISTORY [name] is an 80 y.o. female who is a SNF resident. Patient was normal until this after noon 01/13/2014 when found by staff to have left sided weakness and not be responding appropriately. EMS was called at that time and the patient was brought in as a code stroke. Initial NIHSS of 14. Review of records from SNF show family has requested patient not to be brought to the hospital if she has stroke symptoms but to have therapy at the facility. last known well 01/13/2014 at 15:00. Patient was not administerd TPA secondary to SNF documents express family wishes for patient to not have aggressive treatment for stroke. She was admitted to the step down unit for further evaluation and treatment.  SUBJECTIVE Family at bedside; they feel she is comfortable.  OBJECTIVE Most recent Vital Signs: Filed Vitals:   01/14/14 0800 01/14/14 1000 01/14/14 1443 01/15/14 0458  BP: 107/41 105/44 133/78 152/89  Pulse: 51 73 91 98  Temp:   98.5 F (36.9 C) 98.4 F (36.9 C)  TempSrc:   Axillary Axillary  Resp: 24 21 22 23   Height:      Weight:      SpO2: 97% 99% 94% 90%   CBG (last 3)   Recent Labs  01/13/14 1801  GLUCAP 117*    MEDICATIONS  . LORazepam  0.5 mg Intravenous 3 times per day  . scopolamine  1 patch Transdermal Q72H   PRN:  acetaminophen, atropine, bisacodyl, LORazepam, morphine injection, morphine CONCENTRATE, ondansetron (ZOFRAN) IV  CLINICALLY SIGNIFICANT STUDIES Basic Metabolic Panel:   Recent Labs Lab 01/13/14 1759 01/14/14 0336  NA 140 142  K 4.4 4.7  CL 96 98  CO2 28 27  GLUCOSE 123* 138*  BUN 32* 33*  CREATININE 1.53* 1.47*  CALCIUM 10.1 9.7   Liver Function Tests:   Recent Labs Lab 01/13/14 1759 01/14/14 0336  AST 18 21  ALT 10 11  ALKPHOS 85 76  BILITOT 0.4 0.6  PROT 7.5 7.6  ALBUMIN 3.8 4.0   CBC:   Recent Labs Lab 01/13/14 1759 01/14/14 0336  WBC 10.3 13.4*  NEUTROABS 7.2 10.2*  HGB 11.1* 11.1*  HCT  34.0* 34.8*  MCV 95.2 94.6  PLT 253 268   Coagulation:   Recent Labs Lab 01/13/14 1759  LABPROT 14.0  INR 1.10   Cardiac Enzymes: No results found for this basename: CKTOTAL, CKMB, CKMBINDEX, TROPONINI,  in the last 168 hours Urinalysis: No results found for this basename: COLORURINE, APPERANCEUR, LABSPEC, PHURINE, GLUCOSEU, HGBUR, BILIRUBINUR, KETONESUR, PROTEINUR, UROBILINOGEN, NITRITE, LEUKOCYTESUR,  in the last 168 hours Lipid Panel    Component Value Date/Time   CHOL 159 01/14/2014 0336   TRIG 98 01/14/2014 0336   HDL 56 01/14/2014 0336   CHOLHDL 2.8 01/14/2014 0336   VLDL 20 01/14/2014 0336   LDLCALC 83 01/14/2014 0336   HgbA1C  Lab Results  Component Value Date   HGBA1C 5.5 01/14/2014    Urine Drug Screen:   No results found for this basename: labopia,  cocainscrnur,  labbenz,  amphetmu,  thcu,  labbarb    Alcohol Level: No results found for this basename: ETH,  in the last 168 hours  CT of the brain  01/13/2014   1. No evidence of acute intracranial abnormality. 2. Unchanged cerebral atrophy and chronic small vessel ischemic disease.   CXR  01/13/2014    Congestive heart failure with progressive pulmonary interstitial edema. Superimposed interstitial pneumonitis cannot be excluded.  EKG  Extreme sinus tachycardia. For complete results please see formal report.   Physical Exam   Frail cachectic elderly lady not in distress. . Afebrile. Head is nontraumatic. Neck is supple without bruit.   Cardiac exam no murmur or gallop. Lungs are clear to auscultation. Distal pulses are well felt. Neurologic Exam : Stuporous and will not open eyes. Eyes closed. Right gaze deviation and will not move the eyes to midline even with reflex eye movements. Pupils irregular and sluggishly reactive. Does not blink to threat on either side. Mild left lower facial weakness. Tongue midline. Dense left hemiplegia with no response even to painful stimuli. Antigravity semipurposeful movements on the  right pain. Left plantar is upgoing right is downgoing. Tone is diminished on the left.  ASSESSMENT Ms. [name] is a 80 y.o. female SNF resident presenting with left sided weakness. While initial CT imaging is negative, by neurologic exam patient has a large right brain infarct. Infarct embolic secondary to known atrial fibrillation .  On aspirin 81 mg orally every day and clopidogrel 75 mg orally every day prior to admission. Now on aspirin for secondary stroke prevention. Patient with resultant left hemiparesis, dysphagia, right gaze preference, L HH,. Neuro prognosis for meaningful recovery poor. Prior wishes indicate she/family did not desire aggressive care. Patient now comfort care.    atrial fibrillation   Chronic kidney disease stage 3  Ischemic cardiomyopathy Hyperlipidemia, LDL 83, on lovaza and fish oil PTA, at goal LDL < 100  Hx DVT 2000 NSTEMI hypertension  Hospital day # 2  TREATMENT/PLAN  Agree with comfort care  Will signoff. Call for questions, needs.  Dr. Pearlean Brownie discussed diagnosis, prognosis,  treatment options and plan of care with family at bedside.Annie Main, MSN, RN, ANVP-BC, AGPCNP-BC [facility] Pager: [phone] 01/15/2014 11:25 AM  I have personally obtained a history, examined the patient, evaluated imaging results, and formulated the assessment and plan of care. I agree with the above.  Delia Heady, MD   To contact Stroke Continuity provider, please refer to [email or web address] After hours, contact General Neurology

## 2014-01-15 NOTE — Progress Notes (Signed)
PATIENT DETAILS Name: [name] Age: 80 y.o. Sex: female Date of Birth: Jan 28, 1917 Admit Date: 01/13/2014 Admitting Physician Eduard ClosArshad N Kakrakandy, MD HYQ:MVHQIO,NGEXBMPCP:HUSAIN,KARRAR, MD  Subjective: sleeping   Assessment/Plan: Comfort care currently     Acute CVA (cerebral infarction) - Significant left-sided hemiparesis - comfort care  A. fib with RVR - Comfort care.    HYPOTHYROIDISM, UNSPECIFIED Comfort focus care    HYPERLIPIDEMIA -comfort focus care  Palliative care/ethics - DO NOT RESUSCITATE - Reviewed prior advanced directives available in the paper chart, she did not want hospitalization in case of a acute CVA. We'll await arrival of family to further the need goals of care. I suspect if the patient does very poorly on her swallow eval, she will need residential hospice placement.  Systolic and Diastolic Heart Failure  Congestive heart failure with progressive pulmonary interstitial  edema.  Patient has not received diuretics this admission as currently palliative care  Disposition: Remain inpatient  DVT Prophylaxis: Prophylactic Lovenox   Code Status:  DNR  Family Communication Daughter in law  Procedures:  None  CONSULTS:  neurology  Time spent 35 min    MEDICATIONS: Scheduled Meds: . LORazepam  0.5 mg Intravenous 3 times per day  . scopolamine  1 patch Transdermal Q72H   Continuous Infusions:   PRN Meds:.acetaminophen, atropine, bisacodyl, LORazepam, morphine injection, morphine CONCENTRATE, ondansetron (ZOFRAN) IV  Antibiotics: Anti-infectives   None       PHYSICAL EXAM: Vital signs in last 24 hours: Filed Vitals:   01/14/14 0800 01/14/14 1000 01/14/14 1443 01/15/14 0458  BP: 107/41 105/44 133/78 152/89  Pulse: 51 73 91 98  Temp:   98.5 F (36.9 C) 98.4 F (36.9 C)  TempSrc:   Axillary Axillary  Resp: 24 21 22 23   Height:      Weight:      SpO2: 97% 99% 94% 90%    Weight change:  Filed Weights   01/13/14  1824 01/13/14 2324  Weight: 58.06 kg (128 lb) 62.5 kg (137 lb 12.6 oz)   Body mass index is 26.91 kg/(m^2).   Gen Exam: Lethargic, left facial droop  Neck: Supple, No JVD.  Chest: B/L Clear.   CVS: S1 S2 irregular Abdomen: soft, BS +, non tender, non distended.  Extremities: no edema, lower extremities warm to touch. Neurologic: Left-sided hemiparesis   Skin: No Rash.     Intake/Output from previous day:  Intake/Output Summary (Last 24 hours) at 01/15/14 0908 Last data filed at 01/15/14 0500  Gross per 24 hour  Intake      0 ml  Output    450 ml  Net   -450 ml     LAB RESULTS: CBC  Recent Labs Lab 01/13/14 1759 01/14/14 0336  WBC 10.3 13.4*  HGB 11.1* 11.1*  HCT 34.0* 34.8*  PLT 253 268  MCV 95.2 94.6  MCH 31.1 30.2  MCHC 32.6 31.9  RDW 14.5 14.3  LYMPHSABS 2.1 2.6  MONOABS 0.7 0.6  EOSABS 0.4 0.1  BASOSABS 0.0 0.0    Chemistries   Recent Labs Lab 01/13/14 1759 01/14/14 0336  NA 140 142  K 4.4 4.7  CL 96 98  CO2 28 27  GLUCOSE 123* 138*  BUN 32* 33*  CREATININE 1.53* 1.47*  CALCIUM 10.1 9.7    CBG:  Recent Labs Lab 01/13/14 1801  GLUCAP 117*    GFR Estimated Creatinine Clearance: 19.3 ml/min (by C-G formula based on Cr of 1.47).  Coagulation profile  Recent Labs Lab 01/13/14 1759  INR 1.10    Cardiac Enzymes No results found for this basename: CK, CKMB, TROPONINI, MYOGLOBIN,  in the last 168 hours  No components found with this basename: POCBNP,  No results found for this basename: DDIMER,  in the last 72 hours  Recent Labs  01/14/14 0336  HGBA1C 5.5    Recent Labs  01/14/14 0336  CHOL 159  HDL 56  LDLCALC 83  TRIG 98  CHOLHDL 2.8   No results found for this basename: TSH, T4TOTAL, FREET3, T3FREE, THYROIDAB,  in the last 72 hours No results found for this basename: VITAMINB12, FOLATE, FERRITIN, TIBC, IRON, RETICCTPCT,  in the last 72 hours No results found for this basename: LIPASE, AMYLASE,  in the last 72  hours  Urine Studies No results found for this basename: UACOL, UAPR, USPG, UPH, UTP, UGL, UKET, UBIL, UHGB, UNIT, UROB, ULEU, UEPI, UWBC, URBC, UBAC, CAST, CRYS, UCOM, BILUA,  in the last 72 hours  MICROBIOLOGY: Recent Results (from the past 240 hour(s))  MRSA PCR SCREENING     Status: None   Collection Time    01/14/14  1:21 AM      Result Value Ref Range Status   MRSA by PCR NEGATIVE  NEGATIVE Final   Comment:            The GeneXpert MRSA Assay (FDA     approved for NASAL specimens     only), is one component of a     comprehensive MRSA colonization     surveillance program. It is not     intended to diagnose MRSA     infection nor to guide or     monitor treatment for     MRSA infections.    RADIOLOGY STUDIES/RESULTS: Ct Head Wo Contrast  01/13/2014   CLINICAL DATA:  Code stroke.  Confusion.  EXAM: CT HEAD WITHOUT CONTRAST  TECHNIQUE: Contiguous axial images were obtained from the base of the skull through the vertex without intravenous contrast.  COMPARISON:  12/25/2011  FINDINGS: There is no evidence of acute cortical infarct, intracranial hemorrhage, mass, midline shift, or extra-axial fluid collection. Moderate cerebral atrophy is unchanged. Periventricular white matter hypodensities do not appear significantly changed and are compatible with moderate chronic small vessel ischemic disease.  Prior bilateral cataract surgery is noted. The mastoid air cells are clear. Mild mucosal thickening is noted in the left maxillary sinus and inferior left frontal sinus. There may be a small amount of fluid in the left maxillary sinus.  IMPRESSION: 1. No evidence of acute intracranial abnormality. 2. Unchanged cerebral atrophy and chronic small vessel ischemic disease. These results were called by telephone at the time of interpretation on 01/13/2014 at 6:17 PM to Dr. Anitra Lauth, who verbally acknowledged these results.   Electronically Signed   By: Sebastian Ache   On: 01/13/2014 18:18   Dg Chest  Port 1 View  01/13/2014   CLINICAL DATA:  Shortness of breath and cough.  EXAM: PORTABLE CHEST - 1 VIEW  COMPARISON:  DG CHEST 2 VIEW dated 12/17/2012  FINDINGS: Cardiomegaly with pulmonary vascular prominence and interstitial prominence with Kerley B-lines noted. Small right pleural effusion present. These findings have progressed from prior exam. These findings are consistent with congestive heart failure. No acute osseous abnormality.  IMPRESSION: Congestive heart failure with progressive pulmonary interstitial edema. Superimposed interstitial pneumonitis cannot be excluded.   Electronically Signed   By: Maisie Fus  Register   On: 01/13/2014  23:06    Marlin Canary, DO [facility] Pager:[phone]  If 7PM-7AM, please contact night-coverage [email or web address] Password [email or web address] 01/15/2014, 9:08 AM   LOS: 2 days

## 2014-01-15 NOTE — Clinical Documentation Improvement (Signed)
  Possible Clinical Conditions:   - Coma 2/2 Stroke   - Probable Cerebral Edema 2/2 Stroke   - Other Condition   - Unable to Clinically Determine  Clinical Indicators:   - Documented as Stuporous, Unresponsive, Unable to follow commands, Will not open her eyes.   - "Right gaze deviation and will not move the eyes to midline even with reflex eye movements" per progress notes   Thank You,  Jerral Ralphathy R Kolbe [place]aco ,RN BSN CCDS Certified Clinical Documentation Specialist:  [phone] [facility]- [facility] Information [facility]

## 2014-01-15 NOTE — Clinical Documentation Improvement (Signed)
  Please document the clinical significance of the abnormal CXR results in the progress notes and discharge summary:  Possible Clinical Conditions:   - Chronic Systolic and Diastolic Heart Failure   - Acute on Chronic Systolic and Diastolic Heart Failure   - Other Condition   - Unable to Clinically Determine  Clinical Indicators:  CXR this admission 01/13/14 IMPRESSION:  Congestive heart failure with progressive pulmonary interstitial  edema. Superimposed interstitial pneumonitis cannot be excluded.   Known history of Systolic and Diastolic Heart Failure per H&P  Patient has not received diuretics this admission - currently palliative care  Thank You,  Jerral Ralphathy R Lareta Bruneau ,RN BSN CCDS Certified Clinical Documentation Specialist:  [phone]  [facility]- [facility] Information [facility]

## 2014-01-15 NOTE — Progress Notes (Signed)
Patient discharged to residential hospice [facility] via EMS.  Family agreeable to plan. Report called to hospice RN. Questions answered.

## 2014-01-29 DEATH — deceased

## 2014-03-31 ENCOUNTER — Telehealth: Payer: Self-pay | Admitting: Cardiology

## 2014-03-31 NOTE — Telephone Encounter (Signed)
Amberli Salt passed away on 02/02/2014.
